# Patient Record
Sex: Female | Born: 1945
Health system: Southern US, Community
[De-identification: ages and names within clinical notes are randomized; demographics above are authoritative.]

## PROBLEM LIST (undated history)

## (undated) DIAGNOSIS — F419 Anxiety disorder, unspecified: Secondary | ICD-10-CM

## (undated) DIAGNOSIS — C9200 Acute myeloblastic leukemia, not having achieved remission: Secondary | ICD-10-CM

## (undated) DIAGNOSIS — K635 Polyp of colon: Secondary | ICD-10-CM

## (undated) DIAGNOSIS — K3533 Acute appendicitis with perforation and localized peritonitis, with abscess: Secondary | ICD-10-CM

## (undated) DIAGNOSIS — K5790 Diverticulosis of intestine, part unspecified, without perforation or abscess without bleeding: Secondary | ICD-10-CM

## (undated) DIAGNOSIS — Z952 Presence of prosthetic heart valve: Secondary | ICD-10-CM

## (undated) DIAGNOSIS — J40 Bronchitis, not specified as acute or chronic: Secondary | ICD-10-CM

## (undated) DIAGNOSIS — G4730 Sleep apnea, unspecified: Secondary | ICD-10-CM

## (undated) DIAGNOSIS — I4891 Unspecified atrial fibrillation: Secondary | ICD-10-CM

## (undated) DIAGNOSIS — E785 Hyperlipidemia, unspecified: Secondary | ICD-10-CM

## (undated) DIAGNOSIS — J189 Pneumonia, unspecified organism: Secondary | ICD-10-CM

## (undated) DIAGNOSIS — R001 Bradycardia, unspecified: Secondary | ICD-10-CM

## (undated) DIAGNOSIS — I4892 Unspecified atrial flutter: Secondary | ICD-10-CM

## (undated) DIAGNOSIS — I509 Heart failure, unspecified: Secondary | ICD-10-CM

## (undated) HISTORY — PX: TUBAL LIGATION: SHX77

## (undated) HISTORY — DX: Pneumonia, unspecified organism: J18.9

## (undated) HISTORY — DX: Diverticulosis of intestine, part unspecified, without perforation or abscess without bleeding: K57.90

## (undated) HISTORY — DX: Sleep apnea, unspecified: G47.30

## (undated) HISTORY — DX: Anxiety disorder, unspecified: F41.9

## (undated) HISTORY — DX: Polyp of colon: K63.5

## (undated) HISTORY — PX: OTHER SURGICAL HISTORY: SHX169

## (undated) HISTORY — DX: Hyperlipidemia, unspecified: E78.5

## (undated) HISTORY — DX: Bronchitis, not specified as acute or chronic: J40

## (undated) HISTORY — PX: PACEMAKER INSERTION: SHX728

## (undated) HISTORY — DX: Heart failure, unspecified: I50.9

## (undated) HISTORY — DX: Unspecified atrial fibrillation: I48.91

---

## 1984-09-16 HISTORY — PX: MITRAL VALVE REPLACEMENT: SHX147

## 1985-09-16 DIAGNOSIS — C9200 Acute myeloblastic leukemia, not having achieved remission: Secondary | ICD-10-CM

## 1985-09-16 HISTORY — DX: Acute myeloblastic leukemia, not having achieved remission: C92.00

## 2011-06-11 DIAGNOSIS — I4891 Unspecified atrial fibrillation: Secondary | ICD-10-CM | POA: Insufficient documentation

## 2011-06-11 DIAGNOSIS — Z952 Presence of prosthetic heart valve: Secondary | ICD-10-CM

## 2011-08-13 DIAGNOSIS — E785 Hyperlipidemia, unspecified: Secondary | ICD-10-CM | POA: Insufficient documentation

## 2011-08-14 DIAGNOSIS — F419 Anxiety disorder, unspecified: Secondary | ICD-10-CM | POA: Diagnosis present

## 2013-01-13 ENCOUNTER — Telehealth: Payer: Self-pay | Admitting: Family Medicine

## 2013-01-13 NOTE — Telephone Encounter (Signed)
appt made

## 2013-01-14 ENCOUNTER — Ambulatory Visit (INDEPENDENT_AMBULATORY_CARE_PROVIDER_SITE_OTHER): Payer: Medicare Other | Admitting: General Practice

## 2013-01-14 ENCOUNTER — Encounter: Payer: Self-pay | Admitting: General Practice

## 2013-01-14 VITALS — BP 126/78 | HR 74 | Temp 98.6°F | Ht 67.0 in | Wt 209.0 lb

## 2013-01-14 DIAGNOSIS — R350 Frequency of micturition: Secondary | ICD-10-CM

## 2013-01-14 DIAGNOSIS — N390 Urinary tract infection, site not specified: Secondary | ICD-10-CM

## 2013-01-14 LAB — POCT URINALYSIS DIPSTICK
Leukocytes, UA: NEGATIVE
Protein, UA: NEGATIVE
Spec Grav, UA: 1.02
Urobilinogen, UA: NEGATIVE
pH, UA: 5

## 2013-01-14 LAB — POCT UA - MICROSCOPIC ONLY
Casts, Ur, LPF, POC: NEGATIVE
Crystals, Ur, HPF, POC: NEGATIVE

## 2013-01-14 MED ORDER — CIPROFLOXACIN HCL 500 MG PO TABS: 500.0000 mg | ORAL_TABLET | Freq: Two times a day (BID) | ORAL | Status: DC

## 2013-01-14 NOTE — Progress Notes (Signed)
  Subjective:    Patient ID: Mercedes Thompson, female    DOB: September 19, 1945, 67 y.o.   MRN: [id number]  Urinary Tract Infection  This is a new problem. The current episode started in the past 7 days. The problem occurs intermittently. The problem has been gradually worsening. The quality of the pain is described as aching. There has been no fever. She is sexually active. There is no history of pyelonephritis. Associated symptoms include urgency. Pertinent negatives include no chills or flank pain. She has tried nothing for the symptoms. Her past medical history is significant for recurrent UTIs.  Reports also feeling weak and this occurs sometimes when heart irregular.     Review of Systems  Constitutional: Negative for fever and chills.  Respiratory: Negative for chest tightness and shortness of breath.   Cardiovascular: Negative for chest pain.  Gastrointestinal: Negative for abdominal pain and abdominal distention.  Genitourinary: Positive for urgency. Negative for flank pain and difficulty urinating.  Musculoskeletal: Negative for back pain.  Skin: Negative.   Neurological: Negative for dizziness and headaches.  Psychiatric/Behavioral: Negative.        Objective:   Physical Exam  Constitutional: She is oriented to person, place, and time. She appears well-developed and well-nourished.  Cardiovascular: Normal rate and normal heart sounds.   No murmur heard. Irregular heart rhythm  Pulmonary/Chest: Effort normal and breath sounds normal. No respiratory distress. She exhibits no tenderness.  Neurological: She is alert and oriented to person, place, and time.  Skin: Skin is warm and dry.  Psychiatric: She has a normal mood and affect.   Results for orders placed in visit on 01/14/13  POCT UA - MICROSCOPIC ONLY      Result Value Range   WBC, Ur, HPF, POC 1-2     RBC, urine, microscopic 2-10     Bacteria, U Microscopic few     Mucus, UA mod     Epithelial cells, urine per micros few      Crystals, Ur, HPF, POC neg     Casts, Ur, LPF, POC neg     Yeast, UA neg    POCT URINALYSIS DIPSTICK      Result Value Range   Color, UA yellow     Clarity, UA clear     Glucose, UA neg     Bilirubin, UA neg     Ketones, UA neg     Spec Grav, UA 1.020     Blood, UA mod     pH, UA 5.0     Protein, UA neg     Urobilinogen, UA negative     Nitrite, UA neg     Leukocytes, UA Negative           Assessment & Plan:  Take medications as prescribed and complete course even if feeling better Increase fluid intake Discussed prevention of UTI and proper perineal care Patient verbalized understanding Raymon Mutton, FNP-C

## 2013-01-14 NOTE — Patient Instructions (Addendum)
Urinary Tract Infection Urinary tract infections (UTIs) can develop anywhere along your urinary tract. Your urinary tract is your body's drainage system for removing wastes and extra water. Your urinary tract includes two kidneys, two ureters, a bladder, and a urethra. Your kidneys are a pair of bean-shaped organs. Each kidney is about the size of your fist. They are located below your ribs, one on each side of your spine. CAUSES Infections are caused by microbes, which are microscopic organisms, including fungi, viruses, and bacteria. These organisms are so small that they can only be seen through a microscope. Bacteria are the microbes that most commonly cause UTIs. SYMPTOMS  Symptoms of UTIs may vary by age and gender of the patient and by the location of the infection. Symptoms in young women typically include a frequent and intense urge to urinate and a painful, burning feeling in the bladder or urethra during urination. Older women and men are more likely to be tired, shaky, and weak and have muscle aches and abdominal pain. A fever may mean the infection is in your kidneys. Other symptoms of a kidney infection include pain in your back or sides below the ribs, nausea, and vomiting. DIAGNOSIS To diagnose a UTI, your caregiver will ask you about your symptoms. Your caregiver also will ask to provide a urine sample. The urine sample will be tested for bacteria and white blood cells. White blood cells are made by your body to help fight infection. TREATMENT  Typically, UTIs can be treated with medication. Because most UTIs are caused by a bacterial infection, they usually can be treated with the use of antibiotics. The choice of antibiotic and length of treatment depend on your symptoms and the type of bacteria causing your infection. HOME CARE INSTRUCTIONS  If you were prescribed antibiotics, take them exactly as your caregiver instructs you. Finish the medication even if you feel better after you  have only taken some of the medication.  Drink enough water and fluids to keep your urine clear or pale yellow.  Avoid caffeine, tea, and carbonated beverages. They tend to irritate your bladder.  Empty your bladder often. Avoid holding urine for long periods of time.  Empty your bladder before and after sexual intercourse.  After a bowel movement, women should cleanse from front to back. Use each tissue only once. SEEK MEDICAL CARE IF:   You have back pain.  You develop a fever.  Your symptoms do not begin to resolve within 3 days. SEEK IMMEDIATE MEDICAL CARE IF:   You have severe back pain or lower abdominal pain.  You develop chills.  You have nausea or vomiting.  You have continued burning or discomfort with urination. MAKE SURE YOU:   Understand these instructions.  Will watch your condition.  Will get help right away if you are not doing well or get worse. Document Released: 06/12/2005 Document Revised: 03/03/2012 Document Reviewed: 10/11/2011 Exit[employer] Patient Information 2013 [employer].  

## 2013-03-25 DIAGNOSIS — N393 Stress incontinence (female) (male): Secondary | ICD-10-CM | POA: Insufficient documentation

## 2013-09-16 DIAGNOSIS — R001 Bradycardia, unspecified: Secondary | ICD-10-CM

## 2013-09-16 HISTORY — DX: Bradycardia, unspecified: R00.1

## 2014-02-25 ENCOUNTER — Ambulatory Visit (HOSPITAL_BASED_OUTPATIENT_CLINIC_OR_DEPARTMENT_OTHER): Payer: Medicare Other | Attending: Family Medicine

## 2014-02-25 VITALS — Ht 67.0 in | Wt 224.0 lb

## 2014-02-25 DIAGNOSIS — R0609 Other forms of dyspnea: Secondary | ICD-10-CM | POA: Insufficient documentation

## 2014-02-25 DIAGNOSIS — G4761 Periodic limb movement disorder: Secondary | ICD-10-CM | POA: Diagnosis not present

## 2014-02-25 DIAGNOSIS — G4733 Obstructive sleep apnea (adult) (pediatric): Secondary | ICD-10-CM | POA: Diagnosis not present

## 2014-02-25 DIAGNOSIS — R0989 Other specified symptoms and signs involving the circulatory and respiratory systems: Secondary | ICD-10-CM | POA: Insufficient documentation

## 2014-02-25 DIAGNOSIS — G4730 Sleep apnea, unspecified: Secondary | ICD-10-CM | POA: Diagnosis present

## 2014-02-25 DIAGNOSIS — G4700 Insomnia, unspecified: Secondary | ICD-10-CM | POA: Diagnosis present

## 2014-02-25 DIAGNOSIS — I4949 Other premature depolarization: Secondary | ICD-10-CM | POA: Diagnosis not present

## 2014-02-27 DIAGNOSIS — G4730 Sleep apnea, unspecified: Secondary | ICD-10-CM

## 2014-02-27 DIAGNOSIS — G4700 Insomnia, unspecified: Secondary | ICD-10-CM

## 2014-02-27 NOTE — Sleep Study (Signed)
   NAME: Mercedes Thompson DATE OF BIRTH:  1946-01-23 MEDICAL RECORD NUMBER [id number]  LOCATION: [facility]  PHYSICIAN: Tyara Dassow D  DATE OF STUDY: 02/25/2014  SLEEP STUDY TYPE: Nocturnal Polysomnogram               REFERRING PHYSICIAN: Jonathon Bellows, MD  INDICATION FOR STUDY: Insomnia with sleep apnea  EPWORTH SLEEPINESS SCORE:   12/24  HEIGHT: 5\' 7"  (170.2 cm)  WEIGHT: 101.606 kg (224 lb)    Body mass index is 35.08 kg/(m^2).  NECK SIZE: 15 in.  MEDICATIONS: Charted for review  SLEEP ARCHITECTURE: Total sleep time 213.5 minutes with sleep efficiency 50.3%. Stage I was 5.2%, stage II 75.9%, stage III absent, REM 19% of total sleep time. Sleep latency 41 minutes, REM latency 112.5 minutes, awake after sleep onset 168 minutes, arousal index 5.3, bedtime medication: Lorazepam. Significant difficulty maintaining sleep throughout the study.  RESPIRATORY DATA: Apnea hypopneas index (AHI) 69.1 per hour. 246 total events scored including 135 obstructive apneas, 1 central apnea, 7 mixed apneas, 103 hypopneas. Events were not positional. REM AHI 57.8 per hour. She did not have enough sleep to meet protocol requirements for split protocol CPAP titration on this study night.  OXYGEN DATA: Loud snoring with oxygen desaturation to a nadir of 82% and mean oxygen saturation through the study of 91.5% on room air.  CARDIAC DATA: Sinus rhythm with PVCs  MOVEMENT/PARASOMNIA: 33 limb jerks were counted of which 2 were associated with arousals or awakenings for a periodic limb movement with arousal index of 0.6 per hour.  IMPRESSION/ RECOMMENDATION:   1) Significant difficulty maintaining sleep throughout the study despite lorazepam at bedtime. 2) Severe obstructive sleep apnea/hypopneas syndrome, AHI 69.1 per hour with non-positional events. REM AHI 57.8 per hour. Loud snoring with oxygen desaturation to a nadir of 82% and a mean oxygen saturation through the study of 91.5% on  room air. 3) Scores in this range are usually addressed first with CPAP. Referral sleep medicine consultation is available if needed. This patient can return for a dedicated CPAP titration study if appropriate.   Signed Baird Lyons M.D. Deneise Lever Diplomate, American Board of Sleep Medicine  ELECTRONICALLY SIGNED ON:  02/27/2014, 9:47 AM [place] PH: [phone]   FX: [phone] [place]

## 2014-03-23 ENCOUNTER — Ambulatory Visit (HOSPITAL_BASED_OUTPATIENT_CLINIC_OR_DEPARTMENT_OTHER): Payer: Medicare Other | Attending: Family Medicine | Admitting: Radiology

## 2014-03-23 VITALS — Ht 67.0 in | Wt 224.0 lb

## 2014-03-23 DIAGNOSIS — G4710 Hypersomnia, unspecified: Secondary | ICD-10-CM | POA: Insufficient documentation

## 2014-03-23 DIAGNOSIS — G4730 Sleep apnea, unspecified: Secondary | ICD-10-CM | POA: Diagnosis not present

## 2014-03-26 DIAGNOSIS — G4710 Hypersomnia, unspecified: Secondary | ICD-10-CM

## 2014-03-26 DIAGNOSIS — G4730 Sleep apnea, unspecified: Secondary | ICD-10-CM

## 2014-03-26 NOTE — Sleep Study (Signed)
   NAME: Mercedes Thompson DATE OF BIRTH:  1946/05/12 MEDICAL RECORD NUMBER [id number]  LOCATION: [facility]  PHYSICIAN: YOUNG,CLINTON D  DATE OF STUDY: 03/23/2014  SLEEP STUDY TYPE: Nocturnal Polysomnogram               REFERRING PHYSICIAN: Jonathon Bellows, MD  INDICATION FOR STUDY: Hypersomnia with sleep apnea-CPAP titration  EPWORTH SLEEPINESS SCORE:   12/24 HEIGHT: 5\' 7"  (170.2 cm)  WEIGHT: 224 lb (101.606 kg)    Body mass index is 35.08 kg/(m^2).  NECK SIZE: 14 in.  MEDICATIONS: Charted for review  SLEEP ARCHITECTURE: Total sleep time 326 minutes with sleep efficiency 71.4%. Stage I was 6%, stage II 70.1%, stage III 4.3%, REM 19.6% of total sleep time. Sleep latency 41.5 minutes, REM latency 123 minutes, awake after sleep onset 21.7 minutes, arousal index 36.3, bedtime medication: Lorazepam  RESPIRATORY DATA: CPAP titration protocol. CPAP was titrated to 12 CWP with residual AHI 66.7 per hour reflecting development of central apneas. Masks were changed several times to control leaks and maintain comfort. She was changed to bilevel for higher pressures and to address central apneas with final inspiratory pressure 17 and expiratory pressure 13. This is still associated with a few central apneas. A CPAP pressure of 12 gave adequate control of obstructive events while lying on her side. This would probably be the best pressure for initial home trial.   CARDIAC DATA: Sinus rhythm with occasional PVC  MOVEMENT/PARASOMNIA: Periodic limb movement with 166 total limb jerks counted of which 41 were associated with arousal or awakening for periodic limb movement with arousal index of 7.5 per hour. Bathroom x1  IMPRESSION/ RECOMMENDATION:   1) Difficult CPAP titration. Several mask changes were required before settling on a medium ResMed Quattro fullface mask with heated humidifier. Titration was tried to a maximum CPAP pressure of 16 and several bilevel pressures.      2) Best  comfort and control seem associated with a CPAP of 12. Central apneas were beginning to develop at that pressure level but these do not necessarily require intervention.  3) Baseline polysomnogram on 02/25/2014 record AHI 69.1 per hour with body weight 224 pounds. This relatively high baseline score and difficulty achieving CPAP control suggest the possibility of soft tissue upper airway obstruction. Consider ENT evaluation if clinically appropriate.  Signed Baird Lyons M.D. Deneise Lever Diplomate, American Board of Sleep Medicine  ELECTRONICALLY SIGNED ON:  03/26/2014, 12:45 PM [place] PH: [phone]   FX: [phone] [place]

## 2014-03-29 ENCOUNTER — Ambulatory Visit (HOSPITAL_BASED_OUTPATIENT_CLINIC_OR_DEPARTMENT_OTHER): Payer: Medicare Other

## 2014-04-11 ENCOUNTER — Ambulatory Visit: Payer: Medicare Other | Admitting: Podiatry

## 2014-06-27 ENCOUNTER — Ambulatory Visit: Payer: Medicare Other | Admitting: Podiatry

## 2014-09-16 DIAGNOSIS — I4892 Unspecified atrial flutter: Secondary | ICD-10-CM

## 2014-09-16 HISTORY — DX: Unspecified atrial flutter: I48.92

## 2014-09-20 DIAGNOSIS — Z881 Allergy status to other antibiotic agents status: Secondary | ICD-10-CM | POA: Diagnosis not present

## 2014-09-20 DIAGNOSIS — I484 Atypical atrial flutter: Secondary | ICD-10-CM | POA: Diagnosis not present

## 2014-09-20 DIAGNOSIS — Z954 Presence of other heart-valve replacement: Secondary | ICD-10-CM | POA: Diagnosis not present

## 2014-09-20 DIAGNOSIS — Z888 Allergy status to other drugs, medicaments and biological substances status: Secondary | ICD-10-CM | POA: Diagnosis not present

## 2014-09-20 DIAGNOSIS — Z882 Allergy status to sulfonamides status: Secondary | ICD-10-CM | POA: Diagnosis not present

## 2014-09-27 DIAGNOSIS — S92251D Displaced fracture of navicular [scaphoid] of right foot, subsequent encounter for fracture with routine healing: Secondary | ICD-10-CM | POA: Diagnosis not present

## 2014-10-04 DIAGNOSIS — Z45018 Encounter for adjustment and management of other part of cardiac pacemaker: Secondary | ICD-10-CM | POA: Diagnosis not present

## 2014-10-04 DIAGNOSIS — I499 Cardiac arrhythmia, unspecified: Secondary | ICD-10-CM | POA: Diagnosis not present

## 2014-10-17 DIAGNOSIS — Z7901 Long term (current) use of anticoagulants: Secondary | ICD-10-CM | POA: Diagnosis not present

## 2014-10-19 DIAGNOSIS — Z45018 Encounter for adjustment and management of other part of cardiac pacemaker: Secondary | ICD-10-CM | POA: Diagnosis not present

## 2014-10-19 DIAGNOSIS — Z950 Presence of cardiac pacemaker: Secondary | ICD-10-CM | POA: Diagnosis not present

## 2014-10-19 DIAGNOSIS — Z952 Presence of prosthetic heart valve: Secondary | ICD-10-CM | POA: Diagnosis not present

## 2014-10-19 DIAGNOSIS — Z7901 Long term (current) use of anticoagulants: Secondary | ICD-10-CM | POA: Diagnosis not present

## 2014-10-19 DIAGNOSIS — I499 Cardiac arrhythmia, unspecified: Secondary | ICD-10-CM | POA: Diagnosis not present

## 2014-10-19 DIAGNOSIS — I4891 Unspecified atrial fibrillation: Secondary | ICD-10-CM | POA: Diagnosis not present

## 2014-10-19 DIAGNOSIS — Z79899 Other long term (current) drug therapy: Secondary | ICD-10-CM | POA: Diagnosis not present

## 2014-10-25 DIAGNOSIS — Z7901 Long term (current) use of anticoagulants: Secondary | ICD-10-CM | POA: Diagnosis not present

## 2014-11-02 DIAGNOSIS — Z7901 Long term (current) use of anticoagulants: Secondary | ICD-10-CM | POA: Diagnosis not present

## 2014-11-15 DIAGNOSIS — Z7901 Long term (current) use of anticoagulants: Secondary | ICD-10-CM | POA: Diagnosis not present

## 2014-11-29 DIAGNOSIS — Z01419 Encounter for gynecological examination (general) (routine) without abnormal findings: Secondary | ICD-10-CM | POA: Diagnosis not present

## 2014-12-16 DIAGNOSIS — Z7901 Long term (current) use of anticoagulants: Secondary | ICD-10-CM | POA: Diagnosis not present

## 2014-12-19 DIAGNOSIS — Z13820 Encounter for screening for osteoporosis: Secondary | ICD-10-CM | POA: Diagnosis not present

## 2014-12-19 DIAGNOSIS — Z780 Asymptomatic menopausal state: Secondary | ICD-10-CM | POA: Diagnosis not present

## 2015-01-16 DIAGNOSIS — Z7901 Long term (current) use of anticoagulants: Secondary | ICD-10-CM | POA: Diagnosis not present

## 2015-01-17 DIAGNOSIS — Z950 Presence of cardiac pacemaker: Secondary | ICD-10-CM | POA: Diagnosis not present

## 2015-02-08 DIAGNOSIS — I484 Atypical atrial flutter: Secondary | ICD-10-CM | POA: Diagnosis not present

## 2015-02-08 DIAGNOSIS — Z7901 Long term (current) use of anticoagulants: Secondary | ICD-10-CM | POA: Diagnosis not present

## 2015-02-08 DIAGNOSIS — Z79899 Other long term (current) drug therapy: Secondary | ICD-10-CM | POA: Diagnosis not present

## 2015-02-08 DIAGNOSIS — Z952 Presence of prosthetic heart valve: Secondary | ICD-10-CM | POA: Diagnosis not present

## 2015-02-08 DIAGNOSIS — I499 Cardiac arrhythmia, unspecified: Secondary | ICD-10-CM | POA: Diagnosis not present

## 2015-02-08 DIAGNOSIS — I481 Persistent atrial fibrillation: Secondary | ICD-10-CM | POA: Diagnosis not present

## 2015-02-14 DIAGNOSIS — Z7901 Long term (current) use of anticoagulants: Secondary | ICD-10-CM | POA: Diagnosis not present

## 2015-03-06 DIAGNOSIS — I340 Nonrheumatic mitral (valve) insufficiency: Secondary | ICD-10-CM | POA: Diagnosis not present

## 2015-03-06 DIAGNOSIS — Z952 Presence of prosthetic heart valve: Secondary | ICD-10-CM | POA: Diagnosis not present

## 2015-03-06 DIAGNOSIS — I484 Atypical atrial flutter: Secondary | ICD-10-CM | POA: Diagnosis not present

## 2015-03-06 DIAGNOSIS — Z856 Personal history of leukemia: Secondary | ICD-10-CM | POA: Diagnosis not present

## 2015-03-07 DIAGNOSIS — I4581 Long QT syndrome: Secondary | ICD-10-CM | POA: Diagnosis not present

## 2015-03-07 DIAGNOSIS — Z5181 Encounter for therapeutic drug level monitoring: Secondary | ICD-10-CM | POA: Insufficient documentation

## 2015-03-07 DIAGNOSIS — Z79899 Other long term (current) drug therapy: Secondary | ICD-10-CM | POA: Diagnosis not present

## 2015-03-07 DIAGNOSIS — Z7951 Long term (current) use of inhaled steroids: Secondary | ICD-10-CM | POA: Diagnosis not present

## 2015-03-07 DIAGNOSIS — Z952 Presence of prosthetic heart valve: Secondary | ICD-10-CM | POA: Diagnosis not present

## 2015-03-07 DIAGNOSIS — Z950 Presence of cardiac pacemaker: Secondary | ICD-10-CM | POA: Diagnosis not present

## 2015-03-07 DIAGNOSIS — Z856 Personal history of leukemia: Secondary | ICD-10-CM | POA: Diagnosis not present

## 2015-03-07 DIAGNOSIS — R9431 Abnormal electrocardiogram [ECG] [EKG]: Secondary | ICD-10-CM | POA: Diagnosis not present

## 2015-03-07 DIAGNOSIS — I4892 Unspecified atrial flutter: Secondary | ICD-10-CM | POA: Diagnosis not present

## 2015-03-07 DIAGNOSIS — I4891 Unspecified atrial fibrillation: Secondary | ICD-10-CM | POA: Diagnosis not present

## 2015-03-07 DIAGNOSIS — Z881 Allergy status to other antibiotic agents status: Secondary | ICD-10-CM | POA: Diagnosis not present

## 2015-03-07 DIAGNOSIS — I481 Persistent atrial fibrillation: Secondary | ICD-10-CM | POA: Diagnosis not present

## 2015-03-07 DIAGNOSIS — Z888 Allergy status to other drugs, medicaments and biological substances status: Secondary | ICD-10-CM | POA: Diagnosis not present

## 2015-03-07 DIAGNOSIS — Z7901 Long term (current) use of anticoagulants: Secondary | ICD-10-CM | POA: Diagnosis not present

## 2015-03-17 DIAGNOSIS — K3533 Acute appendicitis with perforation and localized peritonitis, with abscess: Secondary | ICD-10-CM

## 2015-03-17 HISTORY — DX: Acute appendicitis with perforation, localized peritonitis, and gangrene, with abscess: K35.33

## 2015-03-22 DIAGNOSIS — Z7901 Long term (current) use of anticoagulants: Secondary | ICD-10-CM

## 2015-03-22 DIAGNOSIS — R399 Unspecified symptoms and signs involving the genitourinary system: Secondary | ICD-10-CM | POA: Diagnosis not present

## 2015-03-22 DIAGNOSIS — K3580 Unspecified acute appendicitis: Secondary | ICD-10-CM | POA: Diagnosis not present

## 2015-03-22 DIAGNOSIS — R1011 Right upper quadrant pain: Secondary | ICD-10-CM | POA: Diagnosis not present

## 2015-03-22 DIAGNOSIS — I459 Conduction disorder, unspecified: Secondary | ICD-10-CM | POA: Diagnosis not present

## 2015-03-22 DIAGNOSIS — Z87891 Personal history of nicotine dependence: Secondary | ICD-10-CM | POA: Diagnosis not present

## 2015-03-22 DIAGNOSIS — R1031 Right lower quadrant pain: Secondary | ICD-10-CM | POA: Diagnosis not present

## 2015-03-22 DIAGNOSIS — K352 Acute appendicitis with generalized peritonitis: Secondary | ICD-10-CM | POA: Diagnosis not present

## 2015-03-22 DIAGNOSIS — K353 Acute appendicitis with localized peritonitis: Secondary | ICD-10-CM | POA: Diagnosis not present

## 2015-03-22 DIAGNOSIS — I484 Atypical atrial flutter: Secondary | ICD-10-CM | POA: Diagnosis not present

## 2015-03-22 DIAGNOSIS — I4891 Unspecified atrial fibrillation: Secondary | ICD-10-CM | POA: Diagnosis not present

## 2015-03-22 DIAGNOSIS — R109 Unspecified abdominal pain: Secondary | ICD-10-CM | POA: Diagnosis not present

## 2015-03-22 DIAGNOSIS — Z5181 Encounter for therapeutic drug level monitoring: Secondary | ICD-10-CM | POA: Diagnosis not present

## 2015-03-23 DIAGNOSIS — Z5181 Encounter for therapeutic drug level monitoring: Secondary | ICD-10-CM | POA: Diagnosis not present

## 2015-03-23 DIAGNOSIS — Z950 Presence of cardiac pacemaker: Secondary | ICD-10-CM | POA: Diagnosis not present

## 2015-03-23 DIAGNOSIS — I459 Conduction disorder, unspecified: Secondary | ICD-10-CM | POA: Diagnosis present

## 2015-03-23 DIAGNOSIS — Z79899 Other long term (current) drug therapy: Secondary | ICD-10-CM | POA: Diagnosis not present

## 2015-03-23 DIAGNOSIS — R109 Unspecified abdominal pain: Secondary | ICD-10-CM | POA: Diagnosis not present

## 2015-03-23 DIAGNOSIS — Z01810 Encounter for preprocedural cardiovascular examination: Secondary | ICD-10-CM | POA: Diagnosis not present

## 2015-03-23 DIAGNOSIS — Z952 Presence of prosthetic heart valve: Secondary | ICD-10-CM | POA: Diagnosis not present

## 2015-03-23 DIAGNOSIS — Z856 Personal history of leukemia: Secondary | ICD-10-CM | POA: Diagnosis not present

## 2015-03-23 DIAGNOSIS — Z7901 Long term (current) use of anticoagulants: Secondary | ICD-10-CM | POA: Diagnosis not present

## 2015-03-23 DIAGNOSIS — I484 Atypical atrial flutter: Secondary | ICD-10-CM | POA: Diagnosis not present

## 2015-03-23 DIAGNOSIS — I499 Cardiac arrhythmia, unspecified: Secondary | ICD-10-CM | POA: Diagnosis not present

## 2015-03-23 DIAGNOSIS — Z87891 Personal history of nicotine dependence: Secondary | ICD-10-CM | POA: Diagnosis not present

## 2015-03-23 DIAGNOSIS — I4891 Unspecified atrial fibrillation: Secondary | ICD-10-CM | POA: Diagnosis not present

## 2015-03-23 DIAGNOSIS — K352 Acute appendicitis with generalized peritonitis: Secondary | ICD-10-CM | POA: Diagnosis present

## 2015-03-24 HISTORY — PX: APPENDECTOMY: SHX54

## 2015-03-29 DIAGNOSIS — Z7901 Long term (current) use of anticoagulants: Secondary | ICD-10-CM | POA: Diagnosis not present

## 2015-04-04 DIAGNOSIS — Z7901 Long term (current) use of anticoagulants: Secondary | ICD-10-CM | POA: Diagnosis not present

## 2015-04-13 DIAGNOSIS — Z7901 Long term (current) use of anticoagulants: Secondary | ICD-10-CM | POA: Diagnosis not present

## 2015-04-16 DIAGNOSIS — Z7901 Long term (current) use of anticoagulants: Secondary | ICD-10-CM | POA: Diagnosis not present

## 2015-04-18 DIAGNOSIS — Z48815 Encounter for surgical aftercare following surgery on the digestive system: Secondary | ICD-10-CM | POA: Diagnosis not present

## 2015-04-18 DIAGNOSIS — K3580 Unspecified acute appendicitis: Secondary | ICD-10-CM | POA: Diagnosis not present

## 2015-04-19 DIAGNOSIS — Z0000 Encounter for general adult medical examination without abnormal findings: Secondary | ICD-10-CM | POA: Diagnosis not present

## 2015-04-19 DIAGNOSIS — G4733 Obstructive sleep apnea (adult) (pediatric): Secondary | ICD-10-CM | POA: Diagnosis not present

## 2015-04-19 DIAGNOSIS — I4891 Unspecified atrial fibrillation: Secondary | ICD-10-CM | POA: Diagnosis not present

## 2015-04-19 DIAGNOSIS — E785 Hyperlipidemia, unspecified: Secondary | ICD-10-CM | POA: Diagnosis not present

## 2015-04-19 DIAGNOSIS — Z952 Presence of prosthetic heart valve: Secondary | ICD-10-CM | POA: Diagnosis not present

## 2015-04-19 DIAGNOSIS — C9590 Leukemia, unspecified not having achieved remission: Secondary | ICD-10-CM | POA: Diagnosis not present

## 2015-04-19 DIAGNOSIS — E559 Vitamin D deficiency, unspecified: Secondary | ICD-10-CM | POA: Diagnosis not present

## 2015-04-19 DIAGNOSIS — Z7901 Long term (current) use of anticoagulants: Secondary | ICD-10-CM | POA: Diagnosis not present

## 2015-04-21 DIAGNOSIS — Z7901 Long term (current) use of anticoagulants: Secondary | ICD-10-CM | POA: Diagnosis not present

## 2015-04-24 DIAGNOSIS — Z7901 Long term (current) use of anticoagulants: Secondary | ICD-10-CM | POA: Diagnosis not present

## 2015-04-28 DIAGNOSIS — Z7901 Long term (current) use of anticoagulants: Secondary | ICD-10-CM | POA: Diagnosis not present

## 2015-05-02 DIAGNOSIS — Z7901 Long term (current) use of anticoagulants: Secondary | ICD-10-CM | POA: Diagnosis not present

## 2015-05-07 DIAGNOSIS — R350 Frequency of micturition: Secondary | ICD-10-CM | POA: Diagnosis not present

## 2015-05-09 DIAGNOSIS — Z7901 Long term (current) use of anticoagulants: Secondary | ICD-10-CM | POA: Diagnosis not present

## 2015-05-10 DIAGNOSIS — R000 Tachycardia, unspecified: Secondary | ICD-10-CM | POA: Diagnosis not present

## 2015-05-10 DIAGNOSIS — I480 Paroxysmal atrial fibrillation: Secondary | ICD-10-CM | POA: Diagnosis not present

## 2015-05-10 DIAGNOSIS — I495 Sick sinus syndrome: Secondary | ICD-10-CM | POA: Diagnosis not present

## 2015-05-10 DIAGNOSIS — Z79899 Other long term (current) drug therapy: Secondary | ICD-10-CM | POA: Diagnosis not present

## 2015-05-10 DIAGNOSIS — Z950 Presence of cardiac pacemaker: Secondary | ICD-10-CM | POA: Diagnosis not present

## 2015-05-10 DIAGNOSIS — R5383 Other fatigue: Secondary | ICD-10-CM | POA: Diagnosis not present

## 2015-05-10 DIAGNOSIS — Z7901 Long term (current) use of anticoagulants: Secondary | ICD-10-CM | POA: Diagnosis not present

## 2015-05-10 DIAGNOSIS — I4891 Unspecified atrial fibrillation: Secondary | ICD-10-CM | POA: Diagnosis not present

## 2015-05-10 DIAGNOSIS — Z952 Presence of prosthetic heart valve: Secondary | ICD-10-CM | POA: Diagnosis not present

## 2015-05-10 DIAGNOSIS — I4892 Unspecified atrial flutter: Secondary | ICD-10-CM | POA: Diagnosis not present

## 2015-05-12 DIAGNOSIS — Z7901 Long term (current) use of anticoagulants: Secondary | ICD-10-CM | POA: Diagnosis not present

## 2015-05-15 DIAGNOSIS — Z23 Encounter for immunization: Secondary | ICD-10-CM | POA: Diagnosis not present

## 2015-05-15 DIAGNOSIS — N390 Urinary tract infection, site not specified: Secondary | ICD-10-CM | POA: Diagnosis not present

## 2015-05-15 DIAGNOSIS — Z7901 Long term (current) use of anticoagulants: Secondary | ICD-10-CM | POA: Diagnosis not present

## 2015-05-15 DIAGNOSIS — L298 Other pruritus: Secondary | ICD-10-CM | POA: Diagnosis not present

## 2015-05-17 DIAGNOSIS — Z950 Presence of cardiac pacemaker: Secondary | ICD-10-CM | POA: Diagnosis present

## 2015-05-17 DIAGNOSIS — I481 Persistent atrial fibrillation: Secondary | ICD-10-CM | POA: Diagnosis not present

## 2015-05-17 DIAGNOSIS — I495 Sick sinus syndrome: Secondary | ICD-10-CM | POA: Insufficient documentation

## 2015-05-17 DIAGNOSIS — Z4502 Encounter for adjustment and management of automatic implantable cardiac defibrillator: Secondary | ICD-10-CM | POA: Diagnosis not present

## 2015-05-17 DIAGNOSIS — I4892 Unspecified atrial flutter: Secondary | ICD-10-CM | POA: Diagnosis not present

## 2015-05-23 DIAGNOSIS — Z7901 Long term (current) use of anticoagulants: Secondary | ICD-10-CM | POA: Diagnosis not present

## 2015-06-13 DIAGNOSIS — Z7901 Long term (current) use of anticoagulants: Secondary | ICD-10-CM | POA: Diagnosis not present

## 2015-06-14 DIAGNOSIS — J329 Chronic sinusitis, unspecified: Secondary | ICD-10-CM | POA: Diagnosis not present

## 2015-06-20 ENCOUNTER — Ambulatory Visit
Admission: RE | Admit: 2015-06-20 | Discharge: 2015-06-20 | Disposition: A | Discharge status: Home / Self Care | Payer: Medicare Other | Source: Ambulatory Visit | Attending: Family Medicine | Admitting: Family Medicine

## 2015-06-20 ENCOUNTER — Other Ambulatory Visit: Payer: Self-pay | Admitting: Family Medicine

## 2015-06-20 DIAGNOSIS — R05 Cough: Secondary | ICD-10-CM | POA: Diagnosis not present

## 2015-06-20 DIAGNOSIS — R059 Cough, unspecified: Secondary | ICD-10-CM

## 2015-06-20 DIAGNOSIS — Z7901 Long term (current) use of anticoagulants: Secondary | ICD-10-CM | POA: Diagnosis not present

## 2015-06-27 DIAGNOSIS — Z7901 Long term (current) use of anticoagulants: Secondary | ICD-10-CM | POA: Diagnosis not present

## 2015-07-03 DIAGNOSIS — Z7901 Long term (current) use of anticoagulants: Secondary | ICD-10-CM | POA: Diagnosis not present

## 2015-07-07 DIAGNOSIS — Z7901 Long term (current) use of anticoagulants: Secondary | ICD-10-CM | POA: Diagnosis not present

## 2015-07-10 DIAGNOSIS — Z950 Presence of cardiac pacemaker: Secondary | ICD-10-CM | POA: Diagnosis not present

## 2015-07-10 DIAGNOSIS — I481 Persistent atrial fibrillation: Secondary | ICD-10-CM | POA: Diagnosis not present

## 2015-07-10 DIAGNOSIS — I4892 Unspecified atrial flutter: Secondary | ICD-10-CM | POA: Diagnosis not present

## 2015-07-10 DIAGNOSIS — Z8679 Personal history of other diseases of the circulatory system: Secondary | ICD-10-CM | POA: Diagnosis not present

## 2015-07-10 DIAGNOSIS — I517 Cardiomegaly: Secondary | ICD-10-CM | POA: Diagnosis not present

## 2015-07-10 DIAGNOSIS — Z7901 Long term (current) use of anticoagulants: Secondary | ICD-10-CM | POA: Diagnosis not present

## 2015-07-11 DIAGNOSIS — Z950 Presence of cardiac pacemaker: Secondary | ICD-10-CM | POA: Diagnosis not present

## 2015-07-11 DIAGNOSIS — G4730 Sleep apnea, unspecified: Secondary | ICD-10-CM | POA: Diagnosis not present

## 2015-07-11 DIAGNOSIS — I481 Persistent atrial fibrillation: Secondary | ICD-10-CM | POA: Diagnosis not present

## 2015-07-11 DIAGNOSIS — Z954 Presence of other heart-valve replacement: Secondary | ICD-10-CM | POA: Diagnosis not present

## 2015-07-11 DIAGNOSIS — I484 Atypical atrial flutter: Secondary | ICD-10-CM | POA: Diagnosis not present

## 2015-07-11 DIAGNOSIS — I4891 Unspecified atrial fibrillation: Secondary | ICD-10-CM | POA: Diagnosis not present

## 2015-07-11 DIAGNOSIS — I4892 Unspecified atrial flutter: Secondary | ICD-10-CM | POA: Diagnosis not present

## 2015-07-12 DIAGNOSIS — I481 Persistent atrial fibrillation: Secondary | ICD-10-CM | POA: Diagnosis not present

## 2015-07-12 DIAGNOSIS — G4730 Sleep apnea, unspecified: Secondary | ICD-10-CM | POA: Diagnosis not present

## 2015-07-12 DIAGNOSIS — Z954 Presence of other heart-valve replacement: Secondary | ICD-10-CM | POA: Diagnosis not present

## 2015-07-12 DIAGNOSIS — I4892 Unspecified atrial flutter: Secondary | ICD-10-CM | POA: Diagnosis not present

## 2015-07-12 DIAGNOSIS — I4891 Unspecified atrial fibrillation: Secondary | ICD-10-CM | POA: Diagnosis not present

## 2015-07-12 DIAGNOSIS — Z950 Presence of cardiac pacemaker: Secondary | ICD-10-CM | POA: Diagnosis not present

## 2015-07-14 DIAGNOSIS — Z7901 Long term (current) use of anticoagulants: Secondary | ICD-10-CM | POA: Diagnosis not present

## 2015-07-14 DIAGNOSIS — I471 Supraventricular tachycardia: Secondary | ICD-10-CM | POA: Diagnosis not present

## 2015-07-14 DIAGNOSIS — Z950 Presence of cardiac pacemaker: Secondary | ICD-10-CM | POA: Diagnosis not present

## 2015-07-14 DIAGNOSIS — F419 Anxiety disorder, unspecified: Secondary | ICD-10-CM | POA: Diagnosis not present

## 2015-07-17 DIAGNOSIS — Z7901 Long term (current) use of anticoagulants: Secondary | ICD-10-CM | POA: Diagnosis not present

## 2015-07-20 DIAGNOSIS — Z7901 Long term (current) use of anticoagulants: Secondary | ICD-10-CM | POA: Diagnosis not present

## 2015-07-24 DIAGNOSIS — Z7901 Long term (current) use of anticoagulants: Secondary | ICD-10-CM | POA: Diagnosis not present

## 2015-07-27 DIAGNOSIS — Z7901 Long term (current) use of anticoagulants: Secondary | ICD-10-CM | POA: Diagnosis not present

## 2015-07-31 DIAGNOSIS — Z7901 Long term (current) use of anticoagulants: Secondary | ICD-10-CM | POA: Diagnosis not present

## 2015-08-03 DIAGNOSIS — Z7901 Long term (current) use of anticoagulants: Secondary | ICD-10-CM | POA: Diagnosis not present

## 2015-08-07 DIAGNOSIS — Z7901 Long term (current) use of anticoagulants: Secondary | ICD-10-CM | POA: Diagnosis not present

## 2015-08-09 DIAGNOSIS — Z7901 Long term (current) use of anticoagulants: Secondary | ICD-10-CM | POA: Diagnosis not present

## 2015-08-14 DIAGNOSIS — Z1231 Encounter for screening mammogram for malignant neoplasm of breast: Secondary | ICD-10-CM | POA: Diagnosis not present

## 2015-08-15 DIAGNOSIS — Z7901 Long term (current) use of anticoagulants: Secondary | ICD-10-CM | POA: Diagnosis not present

## 2015-08-23 DIAGNOSIS — Z79899 Other long term (current) drug therapy: Secondary | ICD-10-CM | POA: Diagnosis not present

## 2015-08-23 DIAGNOSIS — I483 Typical atrial flutter: Secondary | ICD-10-CM | POA: Diagnosis not present

## 2015-08-23 DIAGNOSIS — I481 Persistent atrial fibrillation: Secondary | ICD-10-CM | POA: Diagnosis not present

## 2015-08-28 DIAGNOSIS — Z5181 Encounter for therapeutic drug level monitoring: Secondary | ICD-10-CM | POA: Diagnosis not present

## 2015-08-28 DIAGNOSIS — Z7901 Long term (current) use of anticoagulants: Secondary | ICD-10-CM | POA: Diagnosis not present

## 2015-08-28 DIAGNOSIS — E559 Vitamin D deficiency, unspecified: Secondary | ICD-10-CM | POA: Diagnosis not present

## 2015-09-06 DIAGNOSIS — Z7901 Long term (current) use of anticoagulants: Secondary | ICD-10-CM | POA: Diagnosis not present

## 2015-09-28 DIAGNOSIS — Z7901 Long term (current) use of anticoagulants: Secondary | ICD-10-CM | POA: Diagnosis not present

## 2015-09-28 DIAGNOSIS — E559 Vitamin D deficiency, unspecified: Secondary | ICD-10-CM | POA: Diagnosis not present

## 2015-10-09 DIAGNOSIS — Z7901 Long term (current) use of anticoagulants: Secondary | ICD-10-CM | POA: Diagnosis not present

## 2015-10-12 DIAGNOSIS — I471 Supraventricular tachycardia: Secondary | ICD-10-CM | POA: Diagnosis not present

## 2015-10-12 DIAGNOSIS — Z950 Presence of cardiac pacemaker: Secondary | ICD-10-CM | POA: Diagnosis not present

## 2015-10-12 DIAGNOSIS — Z45018 Encounter for adjustment and management of other part of cardiac pacemaker: Secondary | ICD-10-CM | POA: Diagnosis not present

## 2015-10-16 DIAGNOSIS — Z7901 Long term (current) use of anticoagulants: Secondary | ICD-10-CM | POA: Diagnosis not present

## 2015-10-25 DIAGNOSIS — I481 Persistent atrial fibrillation: Secondary | ICD-10-CM | POA: Diagnosis not present

## 2015-10-25 DIAGNOSIS — I483 Typical atrial flutter: Secondary | ICD-10-CM | POA: Diagnosis not present

## 2015-10-26 DIAGNOSIS — I471 Supraventricular tachycardia: Secondary | ICD-10-CM | POA: Diagnosis not present

## 2015-10-26 DIAGNOSIS — Z950 Presence of cardiac pacemaker: Secondary | ICD-10-CM | POA: Diagnosis not present

## 2015-10-27 DIAGNOSIS — K5900 Constipation, unspecified: Secondary | ICD-10-CM | POA: Diagnosis not present

## 2015-10-27 DIAGNOSIS — Z7901 Long term (current) use of anticoagulants: Secondary | ICD-10-CM | POA: Diagnosis not present

## 2015-10-29 ENCOUNTER — Encounter (HOSPITAL_COMMUNITY): Payer: Self-pay | Admitting: Oncology

## 2015-10-29 ENCOUNTER — Inpatient Hospital Stay (HOSPITAL_COMMUNITY)
Admission: EM | Admit: 2015-10-29 | Discharge: 2015-10-31 | DRG: 373 | Disposition: A | Discharge status: Home / Self Care | Payer: Medicare Other | Attending: Internal Medicine | Admitting: Internal Medicine

## 2015-10-29 ENCOUNTER — Emergency Department (HOSPITAL_COMMUNITY): Payer: Medicare Other

## 2015-10-29 DIAGNOSIS — Z954 Presence of other heart-valve replacement: Secondary | ICD-10-CM

## 2015-10-29 DIAGNOSIS — T814XXD Infection following a procedure, subsequent encounter: Secondary | ICD-10-CM | POA: Diagnosis not present

## 2015-10-29 DIAGNOSIS — F411 Generalized anxiety disorder: Secondary | ICD-10-CM

## 2015-10-29 DIAGNOSIS — T8149XA Infection following a procedure, other surgical site, initial encounter: Secondary | ICD-10-CM

## 2015-10-29 DIAGNOSIS — R1011 Right upper quadrant pain: Secondary | ICD-10-CM | POA: Diagnosis not present

## 2015-10-29 DIAGNOSIS — Z9049 Acquired absence of other specified parts of digestive tract: Secondary | ICD-10-CM

## 2015-10-29 DIAGNOSIS — I480 Paroxysmal atrial fibrillation: Secondary | ICD-10-CM

## 2015-10-29 DIAGNOSIS — K651 Peritoneal abscess: Principal | ICD-10-CM | POA: Diagnosis present

## 2015-10-29 DIAGNOSIS — Z950 Presence of cardiac pacemaker: Secondary | ICD-10-CM | POA: Diagnosis not present

## 2015-10-29 DIAGNOSIS — T814XXA Infection following a procedure, initial encounter: Secondary | ICD-10-CM | POA: Diagnosis not present

## 2015-10-29 DIAGNOSIS — I4892 Unspecified atrial flutter: Secondary | ICD-10-CM

## 2015-10-29 DIAGNOSIS — Z856 Personal history of leukemia: Secondary | ICD-10-CM | POA: Diagnosis not present

## 2015-10-29 DIAGNOSIS — Z7901 Long term (current) use of anticoagulants: Secondary | ICD-10-CM

## 2015-10-29 DIAGNOSIS — K5900 Constipation, unspecified: Secondary | ICD-10-CM | POA: Diagnosis not present

## 2015-10-29 DIAGNOSIS — T8143XA Infection following a procedure, organ and space surgical site, initial encounter: Secondary | ICD-10-CM

## 2015-10-29 DIAGNOSIS — Z9221 Personal history of antineoplastic chemotherapy: Secondary | ICD-10-CM | POA: Diagnosis not present

## 2015-10-29 DIAGNOSIS — Z79899 Other long term (current) drug therapy: Secondary | ICD-10-CM | POA: Diagnosis not present

## 2015-10-29 DIAGNOSIS — Z952 Presence of prosthetic heart valve: Secondary | ICD-10-CM | POA: Diagnosis not present

## 2015-10-29 DIAGNOSIS — F419 Anxiety disorder, unspecified: Secondary | ICD-10-CM | POA: Diagnosis present

## 2015-10-29 DIAGNOSIS — R109 Unspecified abdominal pain: Secondary | ICD-10-CM | POA: Diagnosis not present

## 2015-10-29 DIAGNOSIS — K352 Acute appendicitis with generalized peritonitis: Secondary | ICD-10-CM | POA: Diagnosis not present

## 2015-10-29 DIAGNOSIS — K3532 Acute appendicitis with perforation and localized peritonitis, without abscess: Secondary | ICD-10-CM | POA: Diagnosis present

## 2015-10-29 HISTORY — DX: Unspecified atrial flutter: I48.92

## 2015-10-29 HISTORY — DX: Bradycardia, unspecified: R00.1

## 2015-10-29 HISTORY — DX: Acute appendicitis with perforation and localized peritonitis, with abscess: K35.33

## 2015-10-29 HISTORY — DX: Presence of prosthetic heart valve: Z95.2

## 2015-10-29 HISTORY — DX: Acute myeloblastic leukemia, not having achieved remission: C92.00

## 2015-10-29 LAB — URINALYSIS, ROUTINE W REFLEX MICROSCOPIC
Bilirubin Urine: NEGATIVE
Glucose, UA: NEGATIVE mg/dL
Ketones, ur: NEGATIVE mg/dL
Leukocytes, UA: NEGATIVE
Nitrite: NEGATIVE
Protein, ur: NEGATIVE mg/dL
Specific Gravity, Urine: 1.007 (ref 1.005–1.030)
pH: 6 (ref 5.0–8.0)

## 2015-10-29 LAB — COMPREHENSIVE METABOLIC PANEL WITH GFR
ALT: 29 U/L (ref 14–54)
AST: 35 U/L (ref 15–41)
Albumin: 4 g/dL (ref 3.5–5.0)
Alkaline Phosphatase: 75 U/L (ref 38–126)
Anion gap: 12 (ref 5–15)
BUN: 17 mg/dL (ref 6–20)
CO2: 23 mmol/L (ref 22–32)
Calcium: 9.3 mg/dL (ref 8.9–10.3)
Chloride: 98 mmol/L — ABNORMAL LOW (ref 101–111)
Creatinine, Ser: 0.86 mg/dL (ref 0.44–1.00)
GFR calc Af Amer: >60 mL/min
GFR calc non Af Amer: >60 mL/min
Glucose, Bld: 140 mg/dL — ABNORMAL HIGH (ref 65–99)
Potassium: 3.9 mmol/L (ref 3.5–5.1)
Sodium: 133 mmol/L — ABNORMAL LOW (ref 135–145)
Total Bilirubin: 1.1 mg/dL (ref 0.3–1.2)
Total Protein: 8 g/dL (ref 6.5–8.1)

## 2015-10-29 LAB — CBC WITH DIFFERENTIAL/PLATELET
Basophils Absolute: 0 K/uL (ref 0.0–0.1)
Basophils Relative: 0 %
Eosinophils Absolute: 0 K/uL (ref 0.0–0.7)
Eosinophils Relative: 0 %
HCT: 40.9 % (ref 36.0–46.0)
Hemoglobin: 13.7 g/dL (ref 12.0–15.0)
Lymphocytes Relative: 6 %
Lymphs Abs: 0.9 K/uL (ref 0.7–4.0)
MCH: 30.8 pg (ref 26.0–34.0)
MCHC: 33.5 g/dL (ref 30.0–36.0)
MCV: 91.9 fL (ref 78.0–100.0)
Monocytes Absolute: 1.2 K/uL — ABNORMAL HIGH (ref 0.1–1.0)
Monocytes Relative: 9 %
Neutro Abs: 11.4 K/uL — ABNORMAL HIGH (ref 1.7–7.7)
Neutrophils Relative %: 85 %
Platelets: 305 K/uL (ref 150–400)
RBC: 4.45 MIL/uL (ref 3.87–5.11)
RDW: 13.3 % (ref 11.5–15.5)
WBC: 13.5 K/uL — ABNORMAL HIGH (ref 4.0–10.5)

## 2015-10-29 LAB — URINE MICROSCOPIC-ADD ON: WBC, UA: NONE SEEN WBC/hpf (ref 0–5)

## 2015-10-29 LAB — LIPASE, BLOOD: Lipase: 28 U/L (ref 11–51)

## 2015-10-29 LAB — PROTIME-INR
INR: 2.91 — ABNORMAL HIGH (ref 0.00–1.49)
Prothrombin Time: 29.9 seconds — ABNORMAL HIGH (ref 11.6–15.2)

## 2015-10-29 MED ORDER — SODIUM CHLORIDE 0.9 % IV SOLN
100.0000 mg | Freq: Every day | INTRAVENOUS | Status: DC
  Administered 2015-10-29 – 2015-10-30 (×2): 100 mg via INTRAVENOUS
  Filled 2015-10-29 (×2): qty 100

## 2015-10-29 MED ORDER — LIP MEDEX EX OINT
1.0000 "application " | TOPICAL_OINTMENT | Freq: Two times a day (BID) | CUTANEOUS | Status: DC
  Administered 2015-10-29 – 2015-10-31 (×4): 1 via TOPICAL
  Filled 2015-10-29: qty 7

## 2015-10-29 MED ORDER — METOPROLOL SUCCINATE ER 25 MG PO TB24
25.0000 mg | ORAL_TABLET | Freq: Every evening | ORAL | Status: DC
  Administered 2015-10-29 – 2015-10-30 (×2): 25 mg via ORAL
  Filled 2015-10-29 (×2): qty 1

## 2015-10-29 MED ORDER — FLEET ENEMA 7-19 GM/118ML RE ENEM
1.0000 | ENEMA | Freq: Once | RECTAL | Status: AC
  Administered 2015-10-29: 1 via RECTAL
  Filled 2015-10-29: qty 1

## 2015-10-29 MED ORDER — MENTHOL 3 MG MT LOZG
1.0000 | LOZENGE | OROMUCOSAL | Status: DC | PRN
  Filled 2015-10-29: qty 9

## 2015-10-29 MED ORDER — SODIUM CHLORIDE 0.9% FLUSH
3.0000 mL | Freq: Two times a day (BID) | INTRAVENOUS | Status: DC
  Administered 2015-10-29 – 2015-10-30 (×2): 3 mL via INTRAVENOUS

## 2015-10-29 MED ORDER — ACETAMINOPHEN 650 MG RE SUPP: 650.0000 mg | Freq: Four times a day (QID) | RECTAL | Status: DC | PRN

## 2015-10-29 MED ORDER — ALBUTEROL SULFATE (2.5 MG/3ML) 0.083% IN NEBU: 2.5000 mg | INHALATION_SOLUTION | Freq: Four times a day (QID) | RESPIRATORY_TRACT | Status: DC | PRN

## 2015-10-29 MED ORDER — ACETAMINOPHEN 325 MG PO TABS: 325.0000 mg | ORAL_TABLET | Freq: Four times a day (QID) | ORAL | Status: DC | PRN

## 2015-10-29 MED ORDER — DIPHENHYDRAMINE HCL 50 MG/ML IJ SOLN: 12.5000 mg | Freq: Four times a day (QID) | INTRAMUSCULAR | Status: DC | PRN

## 2015-10-29 MED ORDER — ONDANSETRON HCL 40 MG/20ML IJ SOLN
8.0000 mg | Freq: Four times a day (QID) | INTRAMUSCULAR | Status: DC | PRN
  Filled 2015-10-29: qty 4

## 2015-10-29 MED ORDER — LACTATED RINGERS IV SOLN
INTRAVENOUS | Status: DC
  Administered 2015-10-29 – 2015-10-30 (×2): via INTRAVENOUS

## 2015-10-29 MED ORDER — FLUTICASONE PROPIONATE 50 MCG/ACT NA SUSP
2.0000 | Freq: Every day | NASAL | Status: DC
  Filled 2015-10-29: qty 16

## 2015-10-29 MED ORDER — MAGIC MOUTHWASH
15.0000 mL | Freq: Four times a day (QID) | ORAL | Status: DC | PRN
  Filled 2015-10-29: qty 15

## 2015-10-29 MED ORDER — SODIUM CHLORIDE 0.9 % IV BOLUS (SEPSIS)
1000.0000 mL | Freq: Once | INTRAVENOUS | Status: AC
  Administered 2015-10-29: 1000 mL via INTRAVENOUS

## 2015-10-29 MED ORDER — ONDANSETRON 4 MG PO TBDP: 4.0000 mg | ORAL_TABLET | Freq: Four times a day (QID) | ORAL | Status: DC | PRN

## 2015-10-29 MED ORDER — SACCHAROMYCES BOULARDII 250 MG PO CAPS
250.0000 mg | ORAL_CAPSULE | Freq: Two times a day (BID) | ORAL | Status: DC
  Administered 2015-10-29 – 2015-10-31 (×4): 250 mg via ORAL
  Filled 2015-10-29 (×4): qty 1

## 2015-10-29 MED ORDER — LORAZEPAM 2 MG/ML IJ SOLN
0.5000 mg | Freq: Three times a day (TID) | INTRAMUSCULAR | Status: DC | PRN
  Administered 2015-10-30: 0.5 mg via INTRAVENOUS
  Administered 2015-10-31: 1 mg via INTRAVENOUS
  Filled 2015-10-29 (×2): qty 1

## 2015-10-29 MED ORDER — BISACODYL 10 MG RE SUPP: 10.0000 mg | Freq: Two times a day (BID) | RECTAL | Status: DC | PRN

## 2015-10-29 MED ORDER — PIPERACILLIN-TAZOBACTAM 3.375 G IVPB
3.3750 g | Freq: Three times a day (TID) | INTRAVENOUS | Status: DC
  Administered 2015-10-29 – 2015-10-31 (×5): 3.375 g via INTRAVENOUS
  Filled 2015-10-29 (×6): qty 50

## 2015-10-29 MED ORDER — METOPROLOL TARTRATE 1 MG/ML IV SOLN: 5.0000 mg | Freq: Four times a day (QID) | INTRAVENOUS | Status: DC | PRN

## 2015-10-29 MED ORDER — METRONIDAZOLE IN NACL 5-0.79 MG/ML-% IV SOLN
500.0000 mg | Freq: Once | INTRAVENOUS | Status: AC
  Administered 2015-10-29: 500 mg via INTRAVENOUS
  Filled 2015-10-29: qty 100

## 2015-10-29 MED ORDER — ADULT MULTIVITAMIN W/MINERALS CH
1.0000 | ORAL_TABLET | Freq: Every day | ORAL | Status: DC
  Administered 2015-10-29 – 2015-10-30 (×2): 1 via ORAL
  Filled 2015-10-29 (×3): qty 1

## 2015-10-29 MED ORDER — DOCUSATE SODIUM 100 MG PO CAPS
100.0000 mg | ORAL_CAPSULE | Freq: Two times a day (BID) | ORAL | Status: DC
  Administered 2015-10-29: 100 mg via ORAL
  Filled 2015-10-29 (×4): qty 1

## 2015-10-29 MED ORDER — MORPHINE SULFATE (PF) 4 MG/ML IV SOLN
4.0000 mg | Freq: Once | INTRAVENOUS | Status: AC
  Administered 2015-10-29: 4 mg via INTRAVENOUS
  Filled 2015-10-29: qty 1

## 2015-10-29 MED ORDER — IOHEXOL 300 MG/ML  SOLN
50.0000 mL | Freq: Once | INTRAMUSCULAR | Status: AC | PRN
  Administered 2015-10-29: 50 mL via ORAL

## 2015-10-29 MED ORDER — LACTATED RINGERS IV BOLUS (SEPSIS)
1000.0000 mL | Freq: Once | INTRAVENOUS | Status: AC
  Administered 2015-10-29: 1000 mL via INTRAVENOUS

## 2015-10-29 MED ORDER — PHENOL 1.4 % MT LIQD
2.0000 | OROMUCOSAL | Status: DC | PRN
  Filled 2015-10-29: qty 177

## 2015-10-29 MED ORDER — ALBUTEROL SULFATE HFA 108 (90 BASE) MCG/ACT IN AERS: 2.0000 | INHALATION_SPRAY | Freq: Four times a day (QID) | RESPIRATORY_TRACT | Status: DC | PRN

## 2015-10-29 MED ORDER — PROMETHAZINE HCL 25 MG/ML IJ SOLN: 6.2500 mg | INTRAMUSCULAR | Status: DC | PRN

## 2015-10-29 MED ORDER — ONDANSETRON HCL 4 MG/2ML IJ SOLN: 4.0000 mg | Freq: Four times a day (QID) | INTRAMUSCULAR | Status: DC | PRN

## 2015-10-29 MED ORDER — LORAZEPAM 1 MG PO TABS
1.0000 mg | ORAL_TABLET | Freq: Every day | ORAL | Status: DC
  Administered 2015-10-29 – 2015-10-30 (×2): 1 mg via ORAL
  Filled 2015-10-29 (×2): qty 1

## 2015-10-29 MED ORDER — ONDANSETRON HCL 4 MG/2ML IJ SOLN
4.0000 mg | Freq: Once | INTRAMUSCULAR | Status: AC
  Administered 2015-10-29: 4 mg via INTRAVENOUS
  Filled 2015-10-29: qty 2

## 2015-10-29 MED ORDER — IOHEXOL 300 MG/ML  SOLN
100.0000 mL | Freq: Once | INTRAMUSCULAR | Status: AC | PRN
  Administered 2015-10-29: 100 mL via INTRAVENOUS

## 2015-10-29 MED ORDER — ALUM & MAG HYDROXIDE-SIMETH 200-200-20 MG/5ML PO SUSP: 30.0000 mL | Freq: Four times a day (QID) | ORAL | Status: DC | PRN

## 2015-10-29 MED ORDER — DEXTROSE 5 % IV SOLN
2.0000 g | Freq: Once | INTRAVENOUS | Status: AC
  Administered 2015-10-29: 2 g via INTRAVENOUS
  Filled 2015-10-29: qty 2

## 2015-10-29 NOTE — Progress Notes (Signed)
Received pt from ED, telemetry applied, VS obtained, oriented to unit, call light placed with in reach

## 2015-10-29 NOTE — ED Provider Notes (Signed)
CSN: [id number]     Arrival date & time 10/29/15  0515 History   First MD Initiated Contact with Patient 10/29/15 [id number]     Chief Complaint  Patient presents with  . Constipation     (Consider location/radiation/quality/duration/timing/severity/associated sxs/prior Treatment) HPI   Mercedes Thompson is a 70 y.o F with a pmhx of atrial fibrillation s/p cardiac ablation and pacemaker implantation, mechanical mitral valve replacement on coumadin, leukemia in 1997 who presents the emergency department complaining of constipation. Patient states that one month ago she began noticing that her stool was harder than normal. She tried taking stool softeners at that time which provided minimal relief. Patient's bowel movements became less frequent, now every other day and her very hard and painful. Patient was seen by PCP last week who prescribed MiraLAX which she has been taking one capful per day with minimal relief. Last bowel movement was 2 days ago. Patient now having lower abdominal cramping and early satiety. Patient also states that last night she felt nauseated and had 1 episode of nonbloody, nonbilious vomiting. Patient denies narcotic use. Patient reports a low fiber diet. No new medications. No history of constipation. She denies dysuria, melena, hematochezia, fever, chills, chest pain, shortness of breath.  Past Medical History  Diagnosis Date  . Anxiety   . Leukemia ([place]) 1987   Past Surgical History  Procedure Laterality Date  . Tubal ligation    . Mitral valve replacement  1986  . Heart ablation    . Appendectomy    . Pacemaker insertion     Family History  Problem Relation Age of Onset  . Depression Mother   . Cancer Father     LUNG   Social History  Substance Use Topics  . Smoking status: Never Smoker   . Smokeless tobacco: Never Used  . Alcohol Use: No     Comment: rare   OB History    No data available     Review of Systems  All other systems reviewed and are  negative.     Allergies  Sulfa antibiotics and Decongest-aid  Home Medications   Prior to Admission medications   Medication Sig Start Date End Date Taking? Authorizing Provider  cholecalciferol (VITAMIN D) 1000 UNITS tablet Take 1,000 Units by mouth daily.    Historical Provider, MD  ciprofloxacin (CIPRO) 500 MG tablet Take 1 tablet (500 mg total) by mouth 2 (two) times daily. 01/14/13   Erby Pian, FNP  escitalopram (LEXAPRO) 10 MG tablet  10/23/12   Historical Provider, MD  fish oil-omega-3 fatty acids 1000 MG capsule Take 2 g by mouth daily.    Historical Provider, MD  LORazepam (ATIVAN) 1 MG tablet  11/23/12   Historical Provider, MD  Multiple Vitamin (MULTIVITAMIN) tablet Take 1 tablet by mouth daily.    Historical Provider, MD  SOTALOL AF 120 MG TABS  12/03/12   Historical Provider, MD  warfarin (COUMADIN) 5 MG tablet  10/29/12   Historical Provider, MD   BP 134/77 mmHg  Pulse 92  Temp(Src) 98.3 F (36.8 C) (Oral)  Resp 17  Ht 5\' 7"  (1.702 m)  Wt 94.802 kg  BMI 32.73 kg/m2  SpO2 96% Physical Exam  Constitutional: She is oriented to person, place, and time. She appears well-developed and well-nourished. No distress.  HENT:  Head: Normocephalic and atraumatic.  Mouth/Throat: No oropharyngeal exudate.  Eyes: Conjunctivae and EOM are normal. Pupils are equal, round, and reactive to light. Right eye exhibits no discharge. Left eye  exhibits no discharge. No scleral icterus.  Cardiovascular: Normal rate, regular rhythm, normal heart sounds and intact distal pulses.  Exam reveals no gallop and no friction rub.   No murmur heard. Pulmonary/Chest: Effort normal and breath sounds normal. No respiratory distress. She has no wheezes. She has no rales. She exhibits no tenderness.  Abdominal: Soft. Bowel sounds are normal. She exhibits no distension and no mass. There is tenderness ( RLQ TTP). There is no rebound and no guarding.  Musculoskeletal: Normal range of motion. She exhibits  no edema.  Neurological: She is alert and oriented to person, place, and time.  Strength 5/5 throughout. No sensory deficits.    Skin: Skin is warm and dry. No rash noted. She is not diaphoretic. No erythema. No pallor.  Psychiatric: She has a normal mood and affect. Her behavior is normal.  Nursing note and vitals reviewed.   ED Course  Procedures (including critical care time) Labs Review Labs Reviewed - No data to display  Imaging Review Dg Abd 1 View  10/29/2015  CLINICAL DATA:  Lower abdominal pain and constipation for 1 month EXAM: ABDOMEN - 1 VIEW COMPARISON:  None. FINDINGS: There is moderate stool throughout colon. There is postoperative change in the right abdomen. There is no bowel dilatation or air-fluid level suggesting obstruction. No free air is seen on this supine examination. There are phleboliths in the pelvis. IMPRESSION: Bowel gas pattern unremarkable. No obstruction or free air. Moderate stool in colon. Electronically Signed   By: Lowella Grip III M.D.   On: 10/29/2015 07:15   Ct Abdomen Pelvis W Contrast  10/29/2015  CLINICAL DATA:  Right upper quadrant abdominal pain, constipation, prior appendectomy EXAM: CT ABDOMEN AND PELVIS WITH CONTRAST TECHNIQUE: Multidetector CT imaging of the abdomen and pelvis was performed using the standard protocol following bolus administration of intravenous contrast. CONTRAST:  171mL OMNIPAQUE IOHEXOL 300 MG/ML  SOLN COMPARISON:  None. FINDINGS: Lower chest:  Lung bases are clear. Cardiomegaly. Mitral valve annular calcifications. Pacemaker leads, incompletely visualized. Hepatobiliary: Liver is within normal limits. No suspicious/enhancing hepatic lesions. Gallbladder is unremarkable. No intrahepatic or extrahepatic ductal dilatation. Pancreas: Within normal limits. Spleen: Within normal limits. Adrenals/Urinary Tract: Adrenal glands are within normal limits. Kidneys within normal limits.  No hydronephrosis. Bladder is within normal  limits. Stomach/Bowel: Stomach is within normal limits. No evidence of bowel obstruction. Prior appendectomy. Significant inflammatory changes involving a short segment of distal ileum with wall thickening, 1.7 x 2.0 x 3.4 cm intramural fluid and gas collection (series 2/image 54), and adjacent mesenteric inflammatory changes (series 2/image 50). Curvilinear hyperdensity adjacent to the collection may reflect a suture line from prior surgery (series 2/image 53), less likely a foreign body. No free air. Sigmoid diverticulosis, without evidence of diverticulitis. Vascular/Lymphatic: No evidence of abdominal aortic aneurysm. No suspicious abdominopelvic lymphadenopathy. Reproductive: Uterus is within normal limits. Bilateral ovaries are within normal limits. Other: No abdominopelvic ascites. Musculoskeletal: Degenerative changes of the visualized thoracolumbar spine. IMPRESSION: No evidence of bowel obstruction.  Prior appendectomy. Inflammatory changes involving the distal ileum with associated 1.7 x 2.0 x 3.4 cm intramural fluid and gas collection, possibly reflecting a developing intramural abscess. Surrounding inflammatory changes. No free air. Differential considerations include infectious/inflammatory ileitis or possibly localized perforation secondary to foreign body or anastomotic breakdown. Electronically Signed   By: Julian Hy M.D.   On: 10/29/2015 12:42   I have personally reviewed and evaluated these images and lab results as part of my medical decision-making.   EKG  Interpretation None      MDM   Final diagnoses:  Intra-abdominal abscess [facility])    70 year old female presents with complaints of constipation on supplemental month ago despite treatments of MiraLAX and stool softeners. Patient appears well, nontoxic, nonseptic appearing. VITAL signs are stable. Will obtain abdominal x-ray and administer enema. Pt does have abdominal TTP in the right mid abdomen. No distention noted.    Pt given enema with no relief of symptoms. Abdominal films reveal moderate stool in colon. Given persistent symptoms, will perform advanced workup including CT abd pelvis to r/o SBO, blood work. Pt given IV fluids, zofran and pain medication with significant pain relief after intervention.    Lab work reveals leukocytosis, WBC 13.5. No other lab abnormalities. CT reveals inflammatory changes involving the distal ileum with associated [phone] cm intramural fluid and gas collection, possibly reflecting a developing intramural abscess. Surrounding inflammatory changes. Patient has no history of Crohn's disease. Patient has multiple colonoscopies with no evidence of Crohn's abnormalities. Patient did have an emergency appendectomy in July 2016[facility]. Patient denies melena, hematochezia. We'll consult Gen. surgery. Patient given ceftriaxone and metronidazole. We will obtain PT and INR S patient is on Coumadin for a mechanical mitral valve replacement.  Pt is therapeutic on her coumadin.  Dr. 05-19-2002 spoke with Dr. Darl Householder with general surgery who recommended that we contact [facility] however, patient does not want to go to [facility]. Will consult GI per his recommendation to hospital medicine. He will consult patient on the floor.  Spoke with [facility]. [facility] with GI who will consult patient on the floor after hospitalist admission. Spoke with hospitalist to admit patient to her service. Patient is hemodynamically stable and awaiting bed.  Patient was discussed with and seen by Dr. Amedeo Plenty who agrees with the treatment plan.      Darl Householder [employer], PA-C 10/29/15 [place] Y[place], MD 10/30/15 [phone]

## 2015-10-29 NOTE — ED Notes (Signed)
Patient transported to X-ray 

## 2015-10-29 NOTE — ED Notes (Signed)
Patient was given enema and was instructed correctly to lie on left side for at least five minutes, longer if tolerated.  Patient verbalized understanding.

## 2015-10-29 NOTE — ED Notes (Signed)
Pt presents d/t constipation.  Was seen earlier in the week by her PCP that rx miralax however that has not been effective.

## 2015-10-29 NOTE — ED Notes (Signed)
Patient was given first enema and told to lie on her back for five minutes.  Patient was unable to retain any fluid from enema and had very little stool output.  PA notified.

## 2015-10-29 NOTE — Progress Notes (Signed)
ANTICOAGULATION CONSULT NOTE - Initial Consult  Pharmacy Consult for heparin Indication: mechanical mitral valve  Allergies  Allergen Reactions  . Sulfa Antibiotics     AFFECTS COUMADIN  . Decongest-Aid [Pseudoephedrine] Palpitations    Patient Measurements: Height: 5\' 7"  (170.2 cm) Weight: 209 lb (94.802 kg) IBW/kg (Calculated) : 61.6 Heparin Dosing Weight: 82 kg  Vital Signs: BP: 120/65 mmHg (02/12 1616) Pulse Rate: 80 (02/12 1616)  Labs:  Recent Labs  10/29/15 1015 10/29/15 1418  HGB 13.7  --   HCT 40.9  --   PLT 305  --   LABPROT  --  29.9*  INR  --  2.91*  CREATININE 0.86  --     Estimated Creatinine Clearance: 73 mL/min (by C-G formula based on Cr of 0.86).   Medical History: Past Medical History  Diagnosis Date  . Anxiety   . AML (acute myeloblastic leukemia) ([place]) 1987  . H/O mitral valve replacement with mechanical valve     coumadin  . Bradycardia 2015    s/p PPM insertion  . Atrial flutter ([place]) 2016    s/p cardioversion 02/2015, previously in [place], recently taken off amiodarone  . Appendicitis with peritoneal abscess 03/2015    s/p ex lap at [facility]      Assessment: 39 yoF on chronic warfarin for mechanical mitral valve replacement. Cardiac history also significant for atrial flutter and fibrillation s/p numerous direct cardioversions and finally s/p atrial ablation in 06/2015 at [facility] recently tapered off of amiodarone, bradycardia s/p PPM.  Patient admitted with intra-abdominal abscess post appendectomy in July 2016.  Holding warfarin and allowing INR to drift down for potential procedure.  Pharmacy consulted to start heparin infusion once INR<2.5.  General surgery and IR consulted.  INR 2.91 today Hgb, Platelets WNL CrCl~73 ml/min  Goal of Therapy:  Heparin level 0.3-0.7 units/ml INR 2.5-3.5 Monitor platelets by anticoagulation protocol: Yes   Plan:  F/u INR in AM to determine start of heparin infusion. Order baseline aPTT with  AM labs as well.  Hershal Coria 10/29/2015,5:36 PM

## 2015-10-29 NOTE — Consult Note (Signed)
[facility]  [address] [place], [place] [address] Phone: [phone] FAX: [phone]     Mercedes Thompson  Apr 04, 1946 [id number]  CARE TEAM:  PCP: Jonathon Bellows, MD  Outpatient Care Team: Patient Care Team: Maurice Small, MD as PCP - General (Family Medicine) Anette Guarneri, MD as Consulting Physician (Surgical Oncology) Bailey Mech as Consulting Physician (Cardiology)  Inpatient Treatment Team: Treatment Team: Attending Provider: Wandra Arthurs, MD; Physician Assistant: Carlos Levering, PA-C; Registered Nurse: Veatrice Kells, RN; Technician: Lorrene Reid, NT; Registered Nurse: Lewis Shock, RN; Consulting Physician: Nolon Nations, MD  This patient is a 70 y.o.female who presents today for surgical evaluation at the request of Dr Darl Householder.   Reason for evaluation: Abdominal abscess.  Pleasant inquisitive woman.  Comes today with her husband.    Had perforated appendicitis that required urgent appendectomy and prolonged hospital stay [facility] last summer, July.  She felt vague abdominal pain for a week before she came in to the hospital with an obvious perforation.  Eventually recovered and no issues postoperatively.  She is fully anticoagulated given her history of a heart valve as well as dysrhythmia issues.  Status post ablation at [facility].  Pacemaker.  Has not had any other cardiac events.  No history of strokes.  Can walk half hour without difficulty.  One-week history of intermittent crampy abdominal pain.  Month history of worsening constipation.  Exercising more.  Worsening cramps after working out in the gym last week.  Persisted.  Worsening constipation.  Started on some MiraLax.  Pain became more intense and crampy.  Worsening cramping and retching.  Worsening nausea.  Developed emesis.  Seems pain became worse in the right lower side.  Came to the emergency room.  Concerning.  CT scan shows  fluid collection on staple line of appendectomy on right lower colon.  Surgical consultation requested.  No personal nor family history of GI/colon cancer, inflammatory bowel disease, irritable bowel syndrome, allergy such as Celiac Sprue, dietary/dairy problems, colitis, ulcers nor gastritis.  No recent sick contacts/gastroenteritis.  No travel outside the country.  No changes in diet.  No dysphagia to solids or liquids.  No significant heartburn or reflux.  No hematochezia, hematemesis, coffee ground emesis.  No evidence of prior gastric/peptic ulceration.  Recalls being told she had diverticulosis on a colonoscopy a few years ago.  Never polyps or cancer.    Past Medical History  Diagnosis Date  . Anxiety   . Leukemia ([place]) 1987    Past Surgical History  Procedure Laterality Date  . Tubal ligation    . Mitral valve replacement  1986  . Heart ablation    . Appendectomy  03/24/2015    [facility] Dr Radene Knee.  perforated appendicitis  . Pacemaker insertion      Social History   Social History  . Marital Status: Married    Spouse Name: N/A  . Number of Children: N/A  . Years of Education: N/A   Occupational History  . Not on file.   Social History Main Topics  . Smoking status: Never Smoker   . Smokeless tobacco: Never Used  . Alcohol Use: No     Comment: rare  . Drug Use: No  . Sexual Activity: Not on file   Other Topics Concern  . Not on file   Social History Narrative    Family History  Problem Relation Age of Onset  .  Depression Mother   . Cancer Father     LUNG    Current Facility-Administered Medications  Medication Dose Route Frequency Provider Last Rate Last Dose  . cefTRIAXone (ROCEPHIN) 2 g in dextrose 5 % 50 mL IVPB  2 g Intravenous Once [employer], PA-C       And  . metroNIDAZOLE (FLAGYL) IVPB 500 mg  500 mg Intravenous Once Samantha Tripp Dowless, PA-C       Current Outpatient Prescriptions  Medication Sig Dispense Refill  . albuterol  (PROVENTIL HFA;VENTOLIN HFA) 108 (90 Base) MCG/ACT inhaler Inhale 2 puffs into the lungs every 6 (six) hours as needed for wheezing or shortness of breath.    . calcium-vitamin D (OSCAL WITH D) 500-200 MG-UNIT tablet Take 1 tablet by mouth daily with breakfast.    . cholecalciferol (VITAMIN D) 1000 UNITS tablet Take 1,000 Units by mouth daily.    . fluticasone (FLONASE) 50 MCG/ACT nasal spray Place 2 sprays into the nose daily.     . furosemide (LASIX) 20 MG tablet Take 20 mg by mouth daily as needed for fluid (for fluid retention over 2 lbs).     . LORazepam (ATIVAN) 1 MG tablet Take 1 mg by mouth at bedtime.     . metoprolol succinate (TOPROL-XL) 25 MG 24 hr tablet Take 25 mg by mouth every evening.     . Multiple Vitamin (MULTIVITAMIN) tablet Take 1 tablet by mouth daily.    . Probiotic Product (PROBIOTIC ADVANCED PO) Take 1 capsule by mouth daily.     Marland Kitchen warfarin (COUMADIN) 5 MG tablet Take 7.5-10 mg by mouth every morning. Take 1 and one-half tablets (7.5 mg) on Thursdays, Saturdays and Sundays. Take 2 tablets (10 mg) on all other days    . amiodarone (PACERONE) 200 MG tablet Reported on 10/29/2015    . ciprofloxacin (CIPRO) 500 MG tablet Take 1 tablet (500 mg total) by mouth 2 (two) times daily. (Patient not taking: Reported on 10/29/2015) 20 tablet 0     Allergies  Allergen Reactions  . Sulfa Antibiotics     AFFECTS COUMADIN  . Decongest-Aid [Pseudoephedrine] Palpitations    ROS: Constitutional:  No fevers, chills, sweats.  Weight stable Eyes:  No vision changes, No discharge HENT:  No sore throats, nasal drainage Lymph: No neck swelling, No bruising easily Pulmonary:  No cough, productive sputum CV: No orthopnea, PND  Patient walks 30 minutes for about 1 miles without difficulty.  No exertional chest/neck/shoulder/arm pain. GI: No personal nor family history of GI/colon cancer, inflammatory bowel disease, irritable bowel syndrome, allergy such as Celiac Sprue, dietary/dairy problems,  colitis, ulcers nor gastritis.  No recent sick contacts/gastroenteritis.  No travel outside the country.  No changes in diet. Renal: No UTIs, No hematuria Genital:  No drainage, bleeding, masses Musculoskeletal: No severe joint pain.  Good ROM major joints Skin:  No sores or lesions.  No rashes Heme/Lymph:  No easy bleeding.  No swollen lymph nodes Neuro: No focal weakness/numbness.  No seizures Psych: No suicidal ideation.  No hallucinations  BP 124/74 mmHg  Pulse 82  Temp(Src) 98.3 F (36.8 C) (Oral)  Resp 15  Ht '5\' 7"'$  (1.702 m)  Wt 94.802 kg (209 lb)  BMI 32.73 kg/m2  SpO2 95%  Physical Exam: General: Pt awake/alert/oriented x4 in no major acute distress Eyes: PERRL, normal EOM. Sclera nonicteric Neuro: CN II-XII intact w/o focal sensory/motor deficits. Lymph: No head/neck/groin lymphadenopathy Psych:  No delerium/psychosis/paranoia HENT: Normocephalic, Mucus membranes moist.  No  thrush Neck: Supple, No tracheal deviation Chest: No pain.  Good respiratory excursion. CV:  Pulses intact.  Regular rhythm Abdomen: Soft, Obese.  Mildly distended.  Vague diffuse tenderness but focal guarding and sharp tenderness at right lower quadrant.  No incarcerated hernias. Ext:  SCDs BLE.  No significant edema.  No cyanosis Skin: No petechiae / purpurea.  No major sores Musculoskeletal: No severe joint pain.  Good ROM major joints   Results:   Labs: Results for orders placed or performed during the hospital encounter of 10/29/15 (from the past 48 hour(s))  CBC with Differential     Status: Abnormal   Collection Time: 10/29/15 10:15 AM  Result Value Ref Range   WBC 13.5 (H) 4.0 - 10.5 K/uL   RBC 4.45 3.87 - 5.11 MIL/uL   Hemoglobin 13.7 12.0 - 15.0 g/dL   HCT 40.9 36.0 - 46.0 %   MCV 91.9 78.0 - 100.0 fL   MCH 30.8 26.0 - 34.0 pg   MCHC 33.5 30.0 - 36.0 g/dL   RDW 13.3 11.5 - 15.5 %   Platelets 305 150 - 400 K/uL   Neutrophils Relative % 85 %   Neutro Abs 11.4 (H) 1.7 - 7.7 K/uL    Lymphocytes Relative 6 %   Lymphs Abs 0.9 0.7 - 4.0 K/uL   Monocytes Relative 9 %   Monocytes Absolute 1.2 (H) 0.1 - 1.0 K/uL   Eosinophils Relative 0 %   Eosinophils Absolute 0.0 0.0 - 0.7 K/uL   Basophils Relative 0 %   Basophils Absolute 0.0 0.0 - 0.1 K/uL  Comprehensive metabolic panel     Status: Abnormal   Collection Time: 10/29/15 10:15 AM  Result Value Ref Range   Sodium 133 (L) 135 - 145 mmol/L   Potassium 3.9 3.5 - 5.1 mmol/L   Chloride 98 (L) 101 - 111 mmol/L   CO2 23 22 - 32 mmol/L   Glucose, Bld 140 (H) 65 - 99 mg/dL   BUN 17 6 - 20 mg/dL   Creatinine, Ser 0.86 0.44 - 1.00 mg/dL   Calcium 9.3 8.9 - 10.3 mg/dL   Total Protein 8.0 6.5 - 8.1 g/dL   Albumin 4.0 3.5 - 5.0 g/dL   AST 35 15 - 41 U/L   ALT 29 14 - 54 U/L   Alkaline Phosphatase 75 38 - 126 U/L   Total Bilirubin 1.1 0.3 - 1.2 mg/dL   GFR calc non Af Amer >60 >60 mL/min   GFR calc Af Amer >60 >60 mL/min    Comment: (NOTE) The eGFR has been calculated using the CKD EPI equation. This calculation has not been validated in all clinical situations. eGFR's persistently <60 mL/min signify possible Chronic Kidney Disease.    Anion gap 12 5 - 15  Lipase, blood     Status: None   Collection Time: 10/29/15 10:15 AM  Result Value Ref Range   Lipase 28 11 - 51 U/L  Urinalysis, Routine w reflex microscopic (not at [facility]     Status: Abnormal   Collection Time: 10/29/15 11:00 AM  Result Value Ref Range   Color, Urine YELLOW YELLOW   APPearance CLEAR CLEAR   Specific Gravity, Urine 1.007 1.005 - 1.030   pH 6.0 5.0 - 8.0   Glucose, UA NEGATIVE NEGATIVE mg/dL   Hgb urine dipstick TRACE (A) NEGATIVE   Bilirubin Urine NEGATIVE NEGATIVE   Ketones, ur NEGATIVE NEGATIVE mg/dL   Protein, ur NEGATIVE NEGATIVE mg/dL   Nitrite NEGATIVE NEGATIVE  Leukocytes, UA NEGATIVE NEGATIVE  Urine microscopic-add on     Status: Abnormal   Collection Time: 10/29/15 11:00 AM  Result Value Ref Range   Squamous Epithelial / LPF 0-5  (A) NONE SEEN   WBC, UA NONE SEEN 0 - 5 WBC/hpf   RBC / HPF 0-5 0 - 5 RBC/hpf   Bacteria, UA RARE (A) NONE SEEN    Imaging / Studies: Dg Abd 1 View  10/29/2015  CLINICAL DATA:  Lower abdominal pain and constipation for 1 month EXAM: ABDOMEN - 1 VIEW COMPARISON:  None. FINDINGS: There is moderate stool throughout colon. There is postoperative change in the right abdomen. There is no bowel dilatation or air-fluid level suggesting obstruction. No free air is seen on this supine examination. There are phleboliths in the pelvis. IMPRESSION: Bowel gas pattern unremarkable. No obstruction or free air. Moderate stool in colon. Electronically Signed   By: Lowella Grip III M.D.   On: 10/29/2015 07:15   Ct Abdomen Pelvis W Contrast  10/29/2015  CLINICAL DATA:  Right upper quadrant abdominal pain, constipation, prior appendectomy EXAM: CT ABDOMEN AND PELVIS WITH CONTRAST TECHNIQUE: Multidetector CT imaging of the abdomen and pelvis was performed using the standard protocol following bolus administration of intravenous contrast. CONTRAST:  182m OMNIPAQUE IOHEXOL 300 MG/ML  SOLN COMPARISON:  None. FINDINGS: Lower chest:  Lung bases are clear. Cardiomegaly. Mitral valve annular calcifications. Pacemaker leads, incompletely visualized. Hepatobiliary: Liver is within normal limits. No suspicious/enhancing hepatic lesions. Gallbladder is unremarkable. No intrahepatic or extrahepatic ductal dilatation. Pancreas: Within normal limits. Spleen: Within normal limits. Adrenals/Urinary Tract: Adrenal glands are within normal limits. Kidneys within normal limits.  No hydronephrosis. Bladder is within normal limits. Stomach/Bowel: Stomach is within normal limits. No evidence of bowel obstruction. Prior appendectomy. Significant inflammatory changes involving a short segment of distal ileum with wall thickening, 1.7 x 2.0 x 3.4 cm intramural fluid and gas collection (series 2/image 54), and adjacent mesenteric inflammatory  changes (series 2/image 50). Curvilinear hyperdensity adjacent to the collection may reflect a suture line from prior surgery (series 2/image 53), less likely a foreign body. No free air. Sigmoid diverticulosis, without evidence of diverticulitis. Vascular/Lymphatic: No evidence of abdominal aortic aneurysm. No suspicious abdominopelvic lymphadenopathy. Reproductive: Uterus is within normal limits. Bilateral ovaries are within normal limits. Other: No abdominopelvic ascites. Musculoskeletal: Degenerative changes of the visualized thoracolumbar spine. IMPRESSION: No evidence of bowel obstruction.  Prior appendectomy. Inflammatory changes involving the distal ileum with associated 1.7 x 2.0 x 3.4 cm intramural fluid and gas collection, possibly reflecting a developing intramural abscess. Surrounding inflammatory changes. No free air. Differential considerations include infectious/inflammatory ileitis or possibly localized perforation secondary to foreign body or anastomotic breakdown. Electronically Signed   By: SJulian HyM.D.   On: 10/29/2015 12:42    Medications / Allergies: per chart  Antibiotics: Anti-infectives    Start     Dose/Rate Route Frequency Ordered Stop   10/29/15 1315  cefTRIAXone (ROCEPHIN) 2 g in dextrose 5 % 50 mL IVPB     2 g 100 mL/hr over 30 Minutes Intravenous  Once 10/29/15 1300     10/29/15 1315  metroNIDAZOLE (FLAGYL) IVPB 500 mg     500 mg 100 mL/hr over 60 Minutes Intravenous  Once 10/29/15 1300        Assessment  SAkita Maxim 70y.o. female       Problem List:  Principal Problem:   Intra-abdominal abscess post-lap appy July 2016 Active Problems:   Long term  current use of anticoagulant   Anxiety disorder   Artificial cardiac pacemaker   H/O mitral valve replacement with mechanical valve   Appendicitis with perforation s/p lap appy 03/22/2015   Intra-abdominal abscess near staple line of prior appendectomy.  Suspicious for delayed  abscess.  Plan:  Obvious abscess near staple line of the appendix.  It would be quite unusual for a post appendectomy abscess now 7 months out from surgery.  Nonetheless rest of differential diagnosis seems unlikely.  Hopefully can cool down with IV antibiotics.  May need percutaneous drainage.     Would hold anticoagulation in possible anticipation of that.  Temporary IV heparin if concerns extremely high but patient has never had a history of stroke or cardiac events.  I spent about 45 minutes talking about the pathophysiology and options.  Patient very anxious and concerned given her cardiac issues managed at prior academic centers.  However stable with ablation.  Good exercise tolerance.  Did note she could get a second opinion and go back to [facility] or [place].  She seems mostly comfortable with [place] at this time.  I think she is anxious given her medical issues in the past and the fact that she is new to this city.  Admit.  IV antibiotics.  We do broad coverage and antifungal coverage.  Hold anticoagulation in anticipation of possible percutaneous drainage or surgery.  Medicine admission given cardiac issues.  Cardiology consult when necessary.  VTE prophylaxis- SCDs, etc  Mobilize as tolerated to help recovery    Adin Hector, M.D., F.A.C.S. Gastrointestinal and Minimally Invasive Surgery [facility] [address] [place], [place] [address] [phone] Main / Paging   10/29/2015  Note: Portions of this report may have been transcribed using voice recognition software. Every effort was made to ensure accuracy; however, inadvertent computerized transcription errors may be present.   Any transcriptional errors that result from this process are unintentional.

## 2015-10-29 NOTE — ED Notes (Signed)
RN GETTING BLOOD WITH IV START PER PT. PT REFUSED FOR ME TO DRAW LABS

## 2015-10-29 NOTE — H&P (Addendum)
[facility] History and Physical  Mercedes Thompson [id number] DOB: Jun 08, 1946 DOA: 10/29/2015  Referring physician:  Donnald Garre PCP:  Jonathon Bellows, MD   Chief Complaint:  Abdominal pain  HPI:  The patient is a 70 y.o. year-old female with history of AML s/p chemotherapy in 1987, history of MV regurgitation s/p mechanical mitral valve replacement on chronic anticoagulation with warfarin, anxiety, atrial flutter and fibrillation s/p numerous direct cardioversions and finally s/p atrial ablation in 06/2015 at [facility] recently tapered off of amiodarone, bradycardia s/p PPM, and appendicitis with peritoneal abscess in 03/2015 which was treated at [facility] who presents with a month history of worsening constipation.  She had some right lower quadrant pain that started approximately one week prior to admission that improved somewhat with heating pad.  She attributed her pain to constipation and was seen by her PCP who recommended miralax.  She had a slightly larger stool yesterday but because her pain was persistent, she came to the emergency department.  One episode of emesis the night prior to admission, nonbilious, nonbloody.  No blood or mucous in her stools.  Denies fevers, chills.  No family history of crohn's or colon cancer.  Last colonoscopy was 2-4 years ago.  She has had one polyp removed which was not precancerous.    In the ER, several enemas did not improve her pain so she underwent further testing.  Her VSS, labs notable for mild leukocytosis to 13.5.  CT demonstrated inflammatory changes involving the distal ileum with associated 1.7 x 2 x 3.4 cm intramural fluid and gas collection, possibly reflecting a developing intramural abscess with surrounding inflammatory changes.  INR 2.91.  Mild hyponatremia and hyperglycemia.  She was started on ceftriaxone and flagyl by the ER MD and general surgery has been consulted and recommending conservative management with antibiotics.    Review  of Systems:  General:  Denies fevers, chills, weight loss or gain HEENT:  Denies changes to hearing and vision, rhinorrhea, sinus congestion, sore throat CV:  Denies chest pain and palpitations, lower extremity edema.  PULM:  Denies SOB, wheezing, cough.   GI:  Per HPI   GU:  Denies dysuria, frequency, urgency ENDO:  Denies polyuria, polydipsia.   HEME:  Denies hematemesis, blood in stools, melena, abnormal bruising or bleeding.  LYMPH:  Denies lymphadenopathy.   MSK:  Denies arthralgias, myalgias.   DERM:  Denies skin rash or ulcer.   NEURO:  Denies focal numbness, weakness, slurred speech, confusion, facial droop.  PSYCH:  Denies anxiety and depression.    Past Medical History  Diagnosis Date  . Anxiety   . AML (acute myeloblastic leukemia) ([place]) 1987  . H/O mitral valve replacement with mechanical valve     coumadin  . Bradycardia 2015    s/p PPM insertion  . Atrial flutter ([employer]) 2016    s/p cardioversion 02/2015, previously in [place], recently taken off amiodarone  . Appendicitis with peritoneal abscess 03/2015    s/p ex lap at [facility]   Past Surgical History  Procedure Laterality Date  . Tubal ligation    . Mitral valve replacement  1986  . Heart ablation    . Appendectomy  03/24/2015    [facility] Dr Radene Knee.  perforated appendicitis  . Pacemaker insertion     Social History:  reports that she has never smoked. She has never used smokeless tobacco. She reports that she does not drink alcohol or use illicit drugs.  Allergies  Allergen Reactions  . Sulfa  Antibiotics     AFFECTS COUMADIN  . Decongest-Aid [Pseudoephedrine] Palpitations    Family History  Problem Relation Age of Onset  . Depression Mother   . Cancer Father     LUNG  . Ulcerative colitis Neg Hx   . Crohn's disease Neg Hx   . Colon polyps Father      Prior to Admission medications   Medication Sig Start Date End Date Taking? Authorizing Provider  albuterol (PROVENTIL HFA;VENTOLIN HFA) 108 (90 Base)  MCG/ACT inhaler Inhale 2 puffs into the lungs every 6 (six) hours as needed for wheezing or shortness of breath.   Yes Historical Provider, MD  calcium-vitamin D (OSCAL WITH D) 500-200 MG-UNIT tablet Take 1 tablet by mouth daily with breakfast.   Yes Historical Provider, MD  cholecalciferol (VITAMIN D) 1000 UNITS tablet Take 1,000 Units by mouth daily.   Yes Historical Provider, MD  fluticasone (FLONASE) 50 MCG/ACT nasal spray Place 2 sprays into the nose daily.    Yes Historical Provider, MD  furosemide (LASIX) 20 MG tablet Take 20 mg by mouth daily as needed for fluid (for fluid retention over 2 lbs).  07/03/14  Yes Historical Provider, MD  LORazepam (ATIVAN) 1 MG tablet Take 1 mg by mouth at bedtime.  11/23/12  Yes Historical Provider, MD  metoprolol succinate (TOPROL-XL) 25 MG 24 hr tablet Take 25 mg by mouth every evening.  10/25/15 10/24/16 Yes Historical Provider, MD  Multiple Vitamin (MULTIVITAMIN) tablet Take 1 tablet by mouth daily.   Yes Historical Provider, MD  Probiotic Product (PROBIOTIC ADVANCED PO) Take 1 capsule by mouth daily.    Yes Historical Provider, MD  warfarin (COUMADIN) 5 MG tablet Take 7.5-10 mg by mouth every morning. Take 1 and one-half tablets (7.5 mg) on Thursdays, Saturdays and Sundays. Take 2 tablets (10 mg) on all other days 10/29/12  Yes Historical Provider, MD  amiodarone (PACERONE) 200 MG tablet Reported on 10/29/2015 08/23/15   Historical Provider, MD   Physical Exam: Filed Vitals:   10/29/15 0725 10/29/15 1104 10/29/15 1336 10/29/15 1616  BP: 133/93 110/68 124/74 120/65  Pulse: 95 80 82 80  Temp:      TempSrc:      Resp: 16 20 15 16   Height:      Weight:      SpO2: 93% 94% 95% 93%     General:  Adult female, anxious appearing, NAD  Eyes:  PERRL, anicteric, non-injected.  ENT:  Nares clear.  OP clear, non-erythematous without plaques or exudates.  MMM.  Neck:  Supple without TM or JVD.    Lymph:  No cervical, supraclavicular, or submandibular  LAD.  Cardiovascular:  RRR, normal S1, S2, without m/r/g.  2+ pulses, warm extremities  Respiratory:  CTA bilaterally without increased WOB.  Abdomen:  NABS.  Soft, ND, mild TTP in the RLQ without rebound or guarding  Skin:  No rashes or focal lesions.  Musculoskeletal:  Normal bulk and tone.  No LE edema.  Psychiatric:  A & O x 4.  Appropriate affect.  Neurologic:  CN 3-12 intact.  5/5 strength.  Sensation intact.  Labs on Admission:  Basic Metabolic Panel:  Recent Labs Lab 10/29/15 1015  NA 133*  K 3.9  CL 98*  CO2 23  GLUCOSE 140*  BUN 17  CREATININE 0.86  CALCIUM 9.3   Liver Function Tests:  Recent Labs Lab 10/29/15 1015  AST 35  ALT 29  ALKPHOS 75  BILITOT 1.1  PROT 8.0  ALBUMIN 4.0  Recent Labs Lab 10/29/15 1015  LIPASE 28   No results for input(s): AMMONIA in the last 168 hours. CBC:  Recent Labs Lab 10/29/15 1015  WBC 13.5*  NEUTROABS 11.4*  HGB 13.7  HCT 40.9  MCV 91.9  PLT 305   Cardiac Enzymes: No results for input(s): CKTOTAL, CKMB, CKMBINDEX, TROPONINI in the last 168 hours.  BNP (last 3 results) No results for input(s): BNP in the last 8760 hours.  ProBNP (last 3 results) No results for input(s): PROBNP in the last 8760 hours.  CBG: No results for input(s): GLUCAP in the last 168 hours.  Radiological Exams on Admission: Dg Abd 1 View  10/29/2015  CLINICAL DATA:  Lower abdominal pain and constipation for 1 month EXAM: ABDOMEN - 1 VIEW COMPARISON:  None. FINDINGS: There is moderate stool throughout colon. There is postoperative change in the right abdomen. There is no bowel dilatation or air-fluid level suggesting obstruction. No free air is seen on this supine examination. There are phleboliths in the pelvis. IMPRESSION: Bowel gas pattern unremarkable. No obstruction or free air. Moderate stool in colon. Electronically Signed   By: Lowella Grip III M.D.   On: 10/29/2015 07:15   Ct Abdomen Pelvis W Contrast  10/29/2015   CLINICAL DATA:  Right upper quadrant abdominal pain, constipation, prior appendectomy EXAM: CT ABDOMEN AND PELVIS WITH CONTRAST TECHNIQUE: Multidetector CT imaging of the abdomen and pelvis was performed using the standard protocol following bolus administration of intravenous contrast. CONTRAST:  12mL OMNIPAQUE IOHEXOL 300 MG/ML  SOLN COMPARISON:  None. FINDINGS: Lower chest:  Lung bases are clear. Cardiomegaly. Mitral valve annular calcifications. Pacemaker leads, incompletely visualized. Hepatobiliary: Liver is within normal limits. No suspicious/enhancing hepatic lesions. Gallbladder is unremarkable. No intrahepatic or extrahepatic ductal dilatation. Pancreas: Within normal limits. Spleen: Within normal limits. Adrenals/Urinary Tract: Adrenal glands are within normal limits. Kidneys within normal limits.  No hydronephrosis. Bladder is within normal limits. Stomach/Bowel: Stomach is within normal limits. No evidence of bowel obstruction. Prior appendectomy. Significant inflammatory changes involving a Donnice Nielsen segment of distal ileum with wall thickening, 1.7 x 2.0 x 3.4 cm intramural fluid and gas collection (series 2/image 54), and adjacent mesenteric inflammatory changes (series 2/image 50). Curvilinear hyperdensity adjacent to the collection may reflect a suture line from prior surgery (series 2/image 53), less likely a foreign body. No free air. Sigmoid diverticulosis, without evidence of diverticulitis. Vascular/Lymphatic: No evidence of abdominal aortic aneurysm. No suspicious abdominopelvic lymphadenopathy. Reproductive: Uterus is within normal limits. Bilateral ovaries are within normal limits. Other: No abdominopelvic ascites. Musculoskeletal: Degenerative changes of the visualized thoracolumbar spine. IMPRESSION: No evidence of bowel obstruction.  Prior appendectomy. Inflammatory changes involving the distal ileum with associated 1.7 x 2.0 x 3.4 cm intramural fluid and gas collection, possibly reflecting  a developing intramural abscess. Surrounding inflammatory changes. No free air. Differential considerations include infectious/inflammatory ileitis or possibly localized perforation secondary to foreign body or anastomotic breakdown. Electronically Signed   By: Julian Hy M.D.   On: 10/29/2015 12:42    EKG: pending  Assessment/Plan Principal Problem:   Intra-abdominal abscess post-lap appy July 2016 Active Problems:   Long term current use of anticoagulant   Anxiety disorder   Artificial cardiac pacemaker   H/O mitral valve replacement with mechanical valve   Appendicitis with perforation s/p lap appy 03/22/2015   Intra-abdominal abscess ([place])   Abscess of abdominal cavity ([place])  ---  Intra-adominal abscess post appendectomy in July 2016.  May be complication from previous surgery.  Less  likely, this reflects crohn's disease.  Plan to treat for abscess with antibiotics and if needed, drain placement.   -  CLD  -  Start zosyn and micafungin empirically -  Agree with florastor -  Appreciate general surgery assistance -  Will discuss with IR if area appears amenable to drainage given size and location  Anxiety disorder -  Agree with additional ativan   Mechanical mitral valve replacement -  Hold coumadin and allow INR to drift down.  Start heparin gtt once INR < 2.5  -  Repeat INR in AM  Paroxysmal atrial fibrillation and flutter s/p atrial ablation in 06/2015 at [facility], reportedly having fewer episodes of tachycardia when her PPM is interrogated.  CHADs2vasc = 2 (age and gender).   -  Telemetry -  Recently stopped amiodarone -  Continue metoprolol -  A/c as above  Hx of bradycardia s/p PPM insertion  Diet:  NPO with ice chips per general surgery Access:  PIV IVF:  yes Proph:  Warfarin > heparin gtt  Code Status: full  Family Communication: patient and her husband Disposition Plan: Admit to telemetry  Time spent: 60 min Janece Canterbury [facility] Pager  [phone]  If 7PM-7AM, please contact night-coverage [email or web address] Password [facility] 10/29/2015, 5:06 PM

## 2015-10-29 NOTE — Progress Notes (Signed)
Pharmacy Antibiotic Note  Mercedes Thompson is a 70 y.o. female admitted on 10/29/2015 with intra-abdominal infection.  Pharmacy has been consulted for Zosyn dosing.  Plan: Zosyn 3.375g IV q8h (4 hour infusion).  Height: 5\' 7"  (170.2 cm) Weight: 209 lb (94.802 kg) IBW/kg (Calculated) : 61.6  Temp (24hrs), Avg:98.3 F (36.8 C), Min:98.3 F (36.8 C), Max:98.3 F (36.8 C)   Recent Labs Lab 10/29/15 1015  WBC 13.5*  CREATININE 0.86    Estimated Creatinine Clearance: 73 mL/min (by C-G formula based on Cr of 0.86).    Allergies  Allergen Reactions  . Sulfa Antibiotics     AFFECTS COUMADIN  . Decongest-Aid [Pseudoephedrine] Palpitations    Antimicrobials this admission: zosyn 2/12 >>   Thank you for allowing pharmacy to be a part of this patient's care.  Nani Skillern Crowford 10/29/2015 4:28 PM

## 2015-10-29 NOTE — ED Notes (Signed)
MD at bedside. Dr. Gross

## 2015-10-30 LAB — BASIC METABOLIC PANEL
ANION GAP: 10 (ref 5–15)
BUN: 11 mg/dL (ref 6–20)
CHLORIDE: 106 mmol/L (ref 101–111)
CO2: 23 mmol/L (ref 22–32)
Calcium: 8.6 mg/dL — ABNORMAL LOW (ref 8.9–10.3)
Creatinine, Ser: 0.8 mg/dL (ref 0.44–1.00)
GFR calc Af Amer: >60 mL/min (ref >60)
Glucose, Bld: 99 mg/dL (ref 65–99)
POTASSIUM: 4.2 mmol/L (ref 3.5–5.1)
SODIUM: 139 mmol/L (ref 135–145)

## 2015-10-30 LAB — CBC
HCT: 37.6 % (ref 36.0–46.0)
Hemoglobin: 11.9 g/dL — ABNORMAL LOW (ref 12.0–15.0)
MCH: 30.5 pg (ref 26.0–34.0)
MCHC: 31.6 g/dL (ref 30.0–36.0)
MCV: 96.4 fL (ref 78.0–100.0)
PLATELETS: 261 10*3/uL (ref 150–400)
RBC: 3.9 MIL/uL (ref 3.87–5.11)
RDW: 13.6 % (ref 11.5–15.5)
WBC: 6.5 10*3/uL (ref 4.0–10.5)

## 2015-10-30 LAB — PROTIME-INR
INR: 2.84 — ABNORMAL HIGH (ref 0.00–1.49)
PROTHROMBIN TIME: 29.4 s — AB (ref 11.6–15.2)

## 2015-10-30 MED ORDER — FUROSEMIDE 20 MG PO TABS
20.0000 mg | ORAL_TABLET | Freq: Once | ORAL | Status: AC
  Administered 2015-10-30: 20 mg via ORAL
  Filled 2015-10-30: qty 1

## 2015-10-30 MED ORDER — WARFARIN SODIUM 5 MG PO TABS
10.0000 mg | ORAL_TABLET | Freq: Once | ORAL | Status: AC
  Administered 2015-10-30: 10 mg via ORAL
  Filled 2015-10-30: qty 2

## 2015-10-30 MED ORDER — WARFARIN - PHARMACIST DOSING INPATIENT: Freq: Every day | Status: DC

## 2015-10-30 NOTE — Progress Notes (Signed)
PHARMACY NOTE -  [facility] has been assisting with dosing of Zosyn for Intra-abdominal abscesses.  Dosage remains stable at 3.375 g IV q8 hr and need for further dosage adjustment appears unlikely at present.    Will sign off at this time.  Please reconsult if a change in clinical status warrants re-evaluation of dosage.  Reuel Boom, PharmD, BCPS Pager: [phone] 10/30/2015, 10:09 AM

## 2015-10-30 NOTE — Progress Notes (Signed)
Patient ID: Mercedes Thompson, female   DOB: 1946/08/04, 70 y.o.   MRN: [id number]  Progress Note: General Surgery Service   Subjective: Pain resolved. Tolerated diet, diarrhea overnight  Objective: Vital signs in last 24 hours: Temp:  [97.5 F (36.4 C)-98.4 F (36.9 C)] 97.5 F (36.4 C) (02/13 0529) Pulse Rate:  [70-83] 71 (02/13 0529) Resp:  [15-20] 16 (02/13 0529) BP: (110-135)/(65-80) 135/80 mmHg (02/13 0529) SpO2:  [93 %-98 %] 98 % (02/13 0529) Weight:  [94.983 kg (209 lb 6.4 oz)] 94.983 kg (209 lb 6.4 oz) (02/12 1714) Last BM Date: 10/29/15  Intake/Output from previous day: 02/12 0701 - 02/13 0700 In: 1355 [P.O.:240; I.V.:915; IV Piggyback:200] Out: -  Intake/Output this shift:    Lungs: CTAB  Cardiovascular: irreg irreg  Abd: soft, NT, ND  Extremities: no edma  Neuro: AOx4  Lab Results: CBC   Recent Labs  10/29/15 1015 10/30/15 0543  WBC 13.5* 6.5  HGB 13.7 11.9*  HCT 40.9 37.6  PLT 305 261   BMET  Recent Labs  10/29/15 1015 10/30/15 0543  NA 133* 139  K 3.9 4.2  CL 98* 106  CO2 23 23  GLUCOSE 140* 99  BUN 17 11  CREATININE 0.86 0.80  CALCIUM 9.3 8.6*   PT/INR  Recent Labs  10/29/15 1418 10/30/15 0543  LABPROT 29.9* 29.4*  INR 2.91* 2.84*   ABG No results for input(s): PHART, HCO3 in the last 72 hours.  Invalid input(s): PCO2, PO2  Studies/Results:  Anti-infectives: Anti-infectives    Start     Dose/Rate Route Frequency Ordered Stop   10/29/15 1700  micafungin (MYCAMINE) 100 mg in sodium chloride 0.9 % 100 mL IVPB    Comments:  Pharmacy may adjust dosing strength, schedule, rate of infusion, etc as needed to optimize therapy   100 mg 100 mL/hr over 1 Hours Intravenous Daily with supper 10/29/15 1611     10/29/15 1615  piperacillin-tazobactam (ZOSYN) IVPB 3.375 g     3.375 g 12.5 mL/hr over 240 Minutes Intravenous 3 times per day 10/29/15 1610     10/29/15 1315  cefTRIAXone (ROCEPHIN) 2 g in dextrose 5 % 50 mL IVPB     2 g 100  mL/hr over 30 Minutes Intravenous  Once 10/29/15 1300 10/29/15 1517   10/29/15 1315  metroNIDAZOLE (FLAGYL) IVPB 500 mg     500 mg 100 mL/hr over 60 Minutes Intravenous  Once 10/29/15 1300 10/29/15 2053      Medications: Scheduled Meds: . docusate sodium  100 mg Oral BID  . fluticasone  2 spray Each Nare Daily  . lip balm  1 application Topical BID  . LORazepam  1 mg Oral QHS  . metoprolol succinate  25 mg Oral QPM  . micafungin  100 mg Intravenous Q supper  . multivitamin with minerals  1 tablet Oral Daily  . piperacillin-tazobactam (ZOSYN)  IV  3.375 g Intravenous 3 times per day  . saccharomyces boulardii  250 mg Oral BID  . sodium chloride flush  3 mL Intravenous Q12H   Continuous Infusions: . lactated ringers 75 mL/hr at 10/30/15 0834   PRN Meds:.acetaminophen, acetaminophen, albuterol, alum & mag hydroxide-simeth, bisacodyl, diphenhydrAMINE, LORazepam, magic mouthwash, menthol-cetylpyridinium, metoprolol, ondansetron (ZOFRAN) IV **OR** ondansetron (ZOFRAN) IV, ondansetron, phenol, promethazine  Assessment/Plan: Patient Active Problem List   Diagnosis Date Noted  . Appendicitis with perforation s/p lap appy 03/22/2015 10/29/2015  . Intra-abdominal abscess post-lap appy July 2016 10/29/2015  . Intra-abdominal abscess ([place]) 10/29/2015  . Abscess  of abdominal cavity ([place]) 10/29/2015  . Artificial cardiac pacemaker 05/17/2015  . Sinoatrial node dysfunction ([facility]) 05/17/2015  . Long term current use of anticoagulant 03/22/2015  . Encounter for therapeutic drug level monitoring 03/07/2015  . Female genuine stress incontinence 03/25/2013  . Anxiety disorder 08/14/2011  . Dyslipidemia 08/13/2011  . Atrial fibrillation ([place]) 06/11/2011  . H/O mitral valve replacement with mechanical valve 06/11/2011   Intraabdominal abscess. Leukocytosis resolved -continue iv abx -advance diet -clinically improved, unlikely to need surgery or drainage procedure   LOS: 1 day   Mickeal Skinner, MD Pg# [phone] [facility]

## 2015-10-30 NOTE — Progress Notes (Signed)
[facility] PROGRESS NOTE  Mercedes Thompson [id number] DOB: 06/13/1946 DOA: 10/29/2015 PCP: Jonathon Bellows, MD  Brief Summary  The patient is a 70 y.o. year-old female with history of AML s/p chemotherapy in 1987, history of MV regurgitation s/p mechanical mitral valve replacement on chronic anticoagulation with warfarin, anxiety, atrial flutter and fibrillation s/p numerous direct cardioversions and finally s/p atrial ablation in 06/2015 at [facility] recently tapered off of amiodarone, bradycardia s/p PPM, and appendicitis with peritoneal abscess in 03/2015 which was treated at [facility] who presents with a month history of worsening constipation. She had some right lower quadrant pain that started approximately one week prior to admission that improved somewhat with heating pad. She attributed her pain to constipation and was seen by her PCP who recommended miralax. She had a slightly larger stool yesterday but because her pain was persistent, she came to the emergency department. One episode of emesis the night prior to admission, nonbilious, nonbloody. No blood or mucous in her stools. Denies fevers, chills. No family history of crohn's or colon cancer. Last colonoscopy was 2-4 years ago. She has had one polyp removed which was not precancerous.   In the ER, several enemas did not improve her pain so she underwent further testing. Her VSS, labs notable for mild leukocytosis to 13.5. CT demonstrated inflammatory changes involving the distal ileum with associated 1.7 x 2 x 3.4 cm intramural fluid and gas collection, possibly reflecting a developing intramural abscess with surrounding inflammatory changes. INR 2.91. Mild hyponatremia and hyperglycemia. She was started on ceftriaxone and flagyl by the ER MD and general surgery has been consulted and recommending conservative management with antibiotics.   Assessment/Plan  Intra-adominal abscess post appendectomy in July 2016. May be  complication from previous surgery. Less likely, this reflects crohn's disease. Plan to treat for abscess with antibiotics.  At this juncture, no need for drainage surgically or by IR.  Advance to FLD and continue zosyn and micafungin empirically.  Continue florastor.    Anxiety disorder, stable, continue prn ativan.  Mechanical mitral valve replacement, no plans for procedure, resume warfarin and repeat INR in AM  Paroxysmal atrial fibrillation and flutter s/p atrial ablation in 06/2015 at [facility], reportedly having fewer episodes of tachycardia when her PPM is interrogated. CHADs2vasc = 2 (age and gender).  - Telemetry:  NSR with occasionally paced beats, rare NSVT with 6 or few beats, no obvious a-fib - Recently stopped amiodarone  - Continue metoprolol  - A/c as above   Hx of bradycardia s/p PPM insertion   Leukocytosis, resolving with antibiotics  Diet:  Healthy heart Access: PIV  IVF: yes  Proph: Therapeutic warfarin  Code Status: full  Family Communication: patient and her husband  Disposition Plan:  Continue telemetry, plan for discharge on oral antibiotics tomorrow    Consultants:  General surgery   Procedures:  CT abd/pelvis  KUB  Antibiotics:  Zosyn 2/12 >   micafungin 2/12 >    HPI/Subjective:  Abdominal bloating and fullness have improved.  She feels much better.  Anxious about her probiotic and risk of C. Diff diarrhea.    Objective: Filed Vitals:   10/29/15 1616 10/29/15 1714 10/29/15 2206 10/30/15 0529  BP: 120/65 123/68 113/67 135/80  Pulse: 80 83 70 71  Temp:  98.4 F (36.9 C) 98.4 F (36.9 C) 97.5 F (36.4 C)  TempSrc:  Oral Oral Oral  Resp: 16 20 20 16   Height:  5\' 7"  (1.702 m)    Weight:  94.983 kg (209 lb 6.4 oz)    SpO2: 93% 98% 96% 98%    Intake/Output Summary (Last 24 hours) at 10/30/15 1415 Last data filed at 10/30/15 0918  Gross per 24 hour  Intake   1715 ml  Output      0 ml  Net   1715 ml   Filed Weights   10/29/15 0522  10/29/15 1714  Weight: 94.802 kg (209 lb) 94.983 kg (209 lb 6.4 oz)   Body mass index is 32.79 kg/(m^2).  Exam:   General:  Adult female, No acute distress  HEENT:  NCAT, MMM  Cardiovascular:  RRR, nl S1, S2 no mrg, 2+ pulses, warm extremities  Respiratory:  CTAB, no increased WOB  Abdomen:   NABS, soft, NT/ND  MSK:   Normal tone and bulk, no LEE  Neuro:  Grossly intact  Data Reviewed: Basic Metabolic Panel:  Recent Labs Lab 10/29/15 1015 10/30/15 0543  NA 133* 139  K 3.9 4.2  CL 98* 106  CO2 23 23  GLUCOSE 140* 99  BUN 17 11  CREATININE 0.86 0.80  CALCIUM 9.3 8.6*   Liver Function Tests:  Recent Labs Lab 10/29/15 1015  AST 35  ALT 29  ALKPHOS 75  BILITOT 1.1  PROT 8.0  ALBUMIN 4.0    Recent Labs Lab 10/29/15 1015  LIPASE 28   No results for input(s): AMMONIA in the last 168 hours. CBC:  Recent Labs Lab 10/29/15 1015 10/30/15 0543  WBC 13.5* 6.5  NEUTROABS 11.4*  --   HGB 13.7 11.9*  HCT 40.9 37.6  MCV 91.9 96.4  PLT 305 261    No results found for this or any previous visit (from the past 240 hour(s)).   Studies: Dg Abd 1 View  10/29/2015  CLINICAL DATA:  Lower abdominal pain and constipation for 1 month EXAM: ABDOMEN - 1 VIEW COMPARISON:  None. FINDINGS: There is moderate stool throughout colon. There is postoperative change in the right abdomen. There is no bowel dilatation or air-fluid level suggesting obstruction. No free air is seen on this supine examination. There are phleboliths in the pelvis. IMPRESSION: Bowel gas pattern unremarkable. No obstruction or free air. Moderate stool in colon. Electronically Signed   By: Lowella Grip III M.D.   On: 10/29/2015 07:15   Ct Abdomen Pelvis W Contrast  10/29/2015  CLINICAL DATA:  Right upper quadrant abdominal pain, constipation, prior appendectomy EXAM: CT ABDOMEN AND PELVIS WITH CONTRAST TECHNIQUE: Multidetector CT imaging of the abdomen and pelvis was performed using the standard  protocol following bolus administration of intravenous contrast. CONTRAST:  146mL OMNIPAQUE IOHEXOL 300 MG/ML  SOLN COMPARISON:  None. FINDINGS: Lower chest:  Lung bases are clear. Cardiomegaly. Mitral valve annular calcifications. Pacemaker leads, incompletely visualized. Hepatobiliary: Liver is within normal limits. No suspicious/enhancing hepatic lesions. Gallbladder is unremarkable. No intrahepatic or extrahepatic ductal dilatation. Pancreas: Within normal limits. Spleen: Within normal limits. Adrenals/Urinary Tract: Adrenal glands are within normal limits. Kidneys within normal limits.  No hydronephrosis. Bladder is within normal limits. Stomach/Bowel: Stomach is within normal limits. No evidence of bowel obstruction. Prior appendectomy. Significant inflammatory changes involving a [place] segment of distal ileum with wall thickening, 1.7 x 2.0 x 3.4 cm intramural fluid and gas collection (series 2/image 54), and adjacent mesenteric inflammatory changes (series 2/image 50). Curvilinear hyperdensity adjacent to the collection may reflect a suture line from prior surgery (series 2/image 53), less likely a foreign body. No free air. Sigmoid diverticulosis, without evidence of diverticulitis. Vascular/Lymphatic:  No evidence of abdominal aortic aneurysm. No suspicious abdominopelvic lymphadenopathy. Reproductive: Uterus is within normal limits. Bilateral ovaries are within normal limits. Other: No abdominopelvic ascites. Musculoskeletal: Degenerative changes of the visualized thoracolumbar spine. IMPRESSION: No evidence of bowel obstruction.  Prior appendectomy. Inflammatory changes involving the distal ileum with associated 1.7 x 2.0 x 3.4 cm intramural fluid and gas collection, possibly reflecting a developing intramural abscess. Surrounding inflammatory changes. No free air. Differential considerations include infectious/inflammatory ileitis or possibly localized perforation secondary to foreign body or anastomotic  breakdown. Electronically Signed   By: Julian Hy M.D.   On: 10/29/2015 12:42    Scheduled Meds: . docusate sodium  100 mg Oral BID  . fluticasone  2 spray Each Nare Daily  . lip balm  1 application Topical BID  . LORazepam  1 mg Oral QHS  . metoprolol succinate  25 mg Oral QPM  . micafungin  100 mg Intravenous Q supper  . multivitamin with minerals  1 tablet Oral Daily  . piperacillin-tazobactam (ZOSYN)  IV  3.375 g Intravenous 3 times per day  . saccharomyces boulardii  250 mg Oral BID  . sodium chloride flush  3 mL Intravenous Q12H  . Warfarin - Pharmacist Dosing Inpatient   Does not apply q1800   Continuous Infusions: . lactated ringers 75 mL/hr at 10/30/15 [id number]    Principal Problem:   Intra-abdominal abscess post-lap appy July 2016 Active Problems:   Long term current use of anticoagulant   Anxiety disorder   Artificial cardiac pacemaker   H/O mitral valve replacement with mechanical valve   Appendicitis with perforation s/p lap appy 03/22/2015   Intra-abdominal abscess ([place])   Abscess of abdominal cavity ([place])    Time spent: 30 min    Shital Crayton, [facility] Pager [phone]. If 7PM-7AM, please contact night-coverage at [email or web address], password [facility] 10/30/2015, 2:15 PM  LOS: 1 day

## 2015-10-30 NOTE — Progress Notes (Signed)
[facility] for warfarin Indication: mechanical mitral valve  Allergies  Allergen Reactions  . Sulfa Antibiotics     AFFECTS COUMADIN  . Decongest-Aid [Pseudoephedrine] Palpitations    Patient Measurements: Height: 5\' 7"  (170.2 cm) Weight: 209 lb 6.4 oz (94.983 kg) IBW/kg (Calculated) : 61.6  Vital Signs: Temp: 97.5 F (36.4 C) (02/13 0529) Temp Source: Oral (02/13 0529) BP: 135/80 mmHg (02/13 0529) Pulse Rate: 71 (02/13 0529)  Labs:  Recent Labs  10/29/15 1015 10/29/15 1418 10/30/15 0543  HGB 13.7  --  11.9*  HCT 40.9  --  37.6  PLT 305  --  261  LABPROT  --  29.9* 29.4*  INR  --  2.91* 2.84*  CREATININE 0.86  --  0.80    Estimated Creatinine Clearance: 78.6 mL/min (by C-G formula based on Cr of 0.8).   Medical History: Past Medical History  Diagnosis Date  . Anxiety   . AML (acute myeloblastic leukemia) ([place]) 1987  . H/O mitral valve replacement with mechanical valve     coumadin  . Bradycardia 2015    s/p PPM insertion  . Atrial flutter ([place]) 2016    s/p cardioversion 02/2015, previously in [place], recently taken off amiodarone  . Appendicitis with peritoneal abscess 03/2015    s/p ex lap at [facility]    Assessment: 57 yoF on chronic warfarin for mechanical mitral valve replacement. Cardiac history also significant for atrial flutter and fibrillation s/p numerous direct cardioversions and finally s/p atrial ablation in 06/2015 at [facility] recently tapered off of amiodarone, bradycardia s/p PPM.  Patient admitted with intra-abdominal abscess post appendectomy in July 2016.  Warfarin held on admission for possible surgical procedure, but per Surgery patient improved today and procedure not likely necessary.  To resume warfarin per pharmacy   Assessment:   Baseline INR: therapeutic  Prior anticoagulation: warfarin 10 mg daily except 7.5 mg on Thurs, Sat, Sun   Today, 10/30/2015:  CBC: Hgb/Plt dropped overnight (may  be partially dilutional); both still w/in reasonable limits  INR therapeutic  Major drug interactions: none, Flagyl x 1 yesterday  No bleeding issues documented  Heart diet ordered, intake not recorded  Goal of Therapy: INR 2.5-3.5  Plan:  Warfarin 10 mg PO tonight at 18:00  Daily INR  CBC at least q72 hr while on warfarin  Monitor for signs of bleeding or thrombosis   Reuel Boom, PharmD Pager: [phone] 10/30/2015, 9:52 AM

## 2015-10-31 DIAGNOSIS — K352 Acute appendicitis with generalized peritonitis: Secondary | ICD-10-CM

## 2015-10-31 DIAGNOSIS — T814XXD Infection following a procedure, subsequent encounter: Secondary | ICD-10-CM

## 2015-10-31 LAB — PROTIME-INR
INR: 3.02 — AB (ref 0.00–1.49)
PROTHROMBIN TIME: 30.8 s — AB (ref 11.6–15.2)

## 2015-10-31 MED ORDER — SODIUM CHLORIDE 0.9 % IV SOLN: 100.0000 mg | INTRAVENOUS | Status: DC

## 2015-10-31 MED ORDER — AMOXICILLIN-POT CLAVULANATE 875-125 MG PO TABS: 1.0000 | ORAL_TABLET | Freq: Two times a day (BID) | ORAL | Status: DC

## 2015-10-31 MED ORDER — SACCHAROMYCES BOULARDII 250 MG PO CAPS: 250.0000 mg | ORAL_CAPSULE | Freq: Two times a day (BID) | ORAL | Status: DC

## 2015-10-31 MED ORDER — WARFARIN SODIUM 5 MG PO TABS: 10.0000 mg | ORAL_TABLET | Freq: Once | ORAL | Status: DC

## 2015-10-31 NOTE — Progress Notes (Signed)
Patient ID: Mercedes Thompson, female   DOB: May 10, 1946, 70 y.o.   MRN: [id number]     [facility]      [address] [place], [place] [address]    Phone: [phone] FAX: [phone]     Subjective: Had a BM.  Tolerating POs.  No pain. Afebrile.   Objective:  Vital signs:  Filed Vitals:   10/30/15 0529 10/30/15 1430 10/30/15 2055 10/31/15 0629  BP: 135/80 118/57 149/78 120/58  Pulse: 71 74 76 72  Temp: 97.5 F (36.4 C) 97.7 F (36.5 C) 98.6 F (37 C) 98.2 F (36.8 C)  TempSrc: Oral Oral Oral Oral  Resp: '16 17 18 18  '$ Height:      Weight:    94.892 kg (209 lb 3.2 oz)  SpO2: 98% 98% 98% 98%    Last BM Date: 10/29/15  Intake/Output   Yesterday:  02/13 0701 - 02/14 0700 In: 2750 [P.O.:700; I.V.:1800; IV Piggyback:250] Out: -  This shift:    Physical Exam: General: Pt awake/alert/oriented x4 in no acute distress Abdomen: Soft.  Nondistended.  Non tender.  No evidence of peritonitis.  No incarcerated hernias.    Problem List:   Principal Problem:   Intra-abdominal abscess post-lap appy July 2016 Active Problems:   Long term current use of anticoagulant   Anxiety disorder   Artificial cardiac pacemaker   H/O mitral valve replacement with mechanical valve   Appendicitis with perforation s/p lap appy 03/22/2015   Intra-abdominal abscess (HCC)   Abscess of abdominal cavity (HCC)    Results:   Labs: Results for orders placed or performed during the hospital encounter of 10/29/15 (from the past 48 hour(s))  CBC with Differential     Status: Abnormal   Collection Time: 10/29/15 10:15 AM  Result Value Ref Range   WBC 13.5 (H) 4.0 - 10.5 K/uL   RBC 4.45 3.87 - 5.11 MIL/uL   Hemoglobin 13.7 12.0 - 15.0 g/dL   HCT 40.9 36.0 - 46.0 %   MCV 91.9 78.0 - 100.0 fL   MCH 30.8 26.0 - 34.0 pg   MCHC 33.5 30.0 - 36.0 g/dL   RDW 13.3 11.5 - 15.5 %   Platelets 305 150 - 400 K/uL   Neutrophils Relative % 85 %   Neutro Abs 11.4  (H) 1.7 - 7.7 K/uL   Lymphocytes Relative 6 %   Lymphs Abs 0.9 0.7 - 4.0 K/uL   Monocytes Relative 9 %   Monocytes Absolute 1.2 (H) 0.1 - 1.0 K/uL   Eosinophils Relative 0 %   Eosinophils Absolute 0.0 0.0 - 0.7 K/uL   Basophils Relative 0 %   Basophils Absolute 0.0 0.0 - 0.1 K/uL  Comprehensive metabolic panel     Status: Abnormal   Collection Time: 10/29/15 10:15 AM  Result Value Ref Range   Sodium 133 (L) 135 - 145 mmol/L   Potassium 3.9 3.5 - 5.1 mmol/L   Chloride 98 (L) 101 - 111 mmol/L   CO2 23 22 - 32 mmol/L   Glucose, Bld 140 (H) 65 - 99 mg/dL   BUN 17 6 - 20 mg/dL   Creatinine, Ser 0.86 0.44 - 1.00 mg/dL   Calcium 9.3 8.9 - 10.3 mg/dL   Total Protein 8.0 6.5 - 8.1 g/dL   Albumin 4.0 3.5 - 5.0 g/dL   AST 35 15 - 41 U/L   ALT 29 14 - 54 U/L   Alkaline Phosphatase 75 38 - 126 U/L  Total Bilirubin 1.1 0.3 - 1.2 mg/dL   GFR calc non Af Amer >60 >60 mL/min   GFR calc Af Amer >60 >60 mL/min    Comment: (NOTE) The eGFR has been calculated using the CKD EPI equation. This calculation has not been validated in all clinical situations. eGFR's persistently <60 mL/min signify possible Chronic Kidney Disease.    Anion gap 12 5 - 15  Lipase, blood     Status: None   Collection Time: 10/29/15 10:15 AM  Result Value Ref Range   Lipase 28 11 - 51 U/L  Urinalysis, Routine w reflex microscopic (not at [facility])     Status: Abnormal   Collection Time: 10/29/15 11:00 AM  Result Value Ref Range   Color, Urine YELLOW YELLOW   APPearance CLEAR CLEAR   Specific Gravity, Urine 1.007 1.005 - 1.030   pH 6.0 5.0 - 8.0   Glucose, UA NEGATIVE NEGATIVE mg/dL   Hgb urine dipstick TRACE (A) NEGATIVE   Bilirubin Urine NEGATIVE NEGATIVE   Ketones, ur NEGATIVE NEGATIVE mg/dL   Protein, ur NEGATIVE NEGATIVE mg/dL   Nitrite NEGATIVE NEGATIVE   Leukocytes, UA NEGATIVE NEGATIVE  Urine microscopic-add on     Status: Abnormal   Collection Time: 10/29/15 11:00 AM  Result Value Ref Range   Squamous  Epithelial / LPF 0-5 (A) NONE SEEN   WBC, UA NONE SEEN 0 - 5 WBC/hpf   RBC / HPF 0-5 0 - 5 RBC/hpf   Bacteria, UA RARE (A) NONE SEEN  Protime-INR     Status: Abnormal   Collection Time: 10/29/15  2:18 PM  Result Value Ref Range   Prothrombin Time 29.9 (H) 11.6 - 15.2 seconds   INR 2.91 (H) 0.00 - 1.49  CBC     Status: Abnormal   Collection Time: 10/30/15  5:43 AM  Result Value Ref Range   WBC 6.5 4.0 - 10.5 K/uL   RBC 3.90 3.87 - 5.11 MIL/uL   Hemoglobin 11.9 (L) 12.0 - 15.0 g/dL   HCT 37.6 36.0 - 46.0 %   MCV 96.4 78.0 - 100.0 fL   MCH 30.5 26.0 - 34.0 pg   MCHC 31.6 30.0 - 36.0 g/dL   RDW 13.6 11.5 - 15.5 %   Platelets 261 150 - 400 K/uL  Protime-INR     Status: Abnormal   Collection Time: 10/30/15  5:43 AM  Result Value Ref Range   Prothrombin Time 29.4 (H) 11.6 - 15.2 seconds   INR 2.84 (H) 0.00 - 5.32  Basic metabolic panel     Status: Abnormal   Collection Time: 10/30/15  5:43 AM  Result Value Ref Range   Sodium 139 135 - 145 mmol/L   Potassium 4.2 3.5 - 5.1 mmol/L   Chloride 106 101 - 111 mmol/L   CO2 23 22 - 32 mmol/L   Glucose, Bld 99 65 - 99 mg/dL   BUN 11 6 - 20 mg/dL   Creatinine, Ser 0.80 0.44 - 1.00 mg/dL   Calcium 8.6 (L) 8.9 - 10.3 mg/dL   GFR calc non Af Amer >60 >60 mL/min   GFR calc Af Amer >60 >60 mL/min    Comment: (NOTE) The eGFR has been calculated using the CKD EPI equation. This calculation has not been validated in all clinical situations. eGFR's persistently <60 mL/min signify possible Chronic Kidney Disease.    Anion gap 10 5 - 15  Protime-INR     Status: Abnormal   Collection Time: 10/31/15  5:19 AM  Result Value Ref Range   Prothrombin Time 30.8 (H) 11.6 - 15.2 seconds   INR 3.02 (H) 0.00 - 1.49    Imaging / Studies: Ct Abdomen Pelvis W Contrast  10/29/2015  CLINICAL DATA:  Right upper quadrant abdominal pain, constipation, prior appendectomy EXAM: CT ABDOMEN AND PELVIS WITH CONTRAST TECHNIQUE: Multidetector CT imaging of the  abdomen and pelvis was performed using the standard protocol following bolus administration of intravenous contrast. CONTRAST:  OMNIPAQUE IOHEXOL 300 MG/ML  SOLN COMPARISON:  None. FINDINGS: Lower chest:  Lung bases are clear. Cardiomegaly. Mitral valve annular calcifications. Pacemaker leads, incompletely visualized. Hepatobiliary: Liver is within normal limits. No suspicious/enhancing hepatic lesions. Gallbladder is unremarkable. No intrahepatic or extrahepatic ductal dilatation. Pancreas: Within normal limits. Spleen: Within normal limits. Adrenals/Urinary Tract: Adrenal glands are within normal limits. Kidneys within normal limits.  No hydronephrosis. Bladder is within normal limits. Stomach/Bowel: Stomach is within normal limits. No evidence of bowel obstruction. Prior appendectomy. Significant inflammatory changes involving a short segment of distal ileum with wall thickening, 1.7 x 2.0 x 3.4 cm intramural fluid and gas collection (series 2/image 54), and adjacent mesenteric inflammatory changes (series 2/image 50). Curvilinear hyperdensity adjacent to the collection may reflect a suture line from prior surgery (series 2/image 53), less likely a foreign body. No free air. Sigmoid diverticulosis, without evidence of diverticulitis. Vascular/Lymphatic: No evidence of abdominal aortic aneurysm. No suspicious abdominopelvic lymphadenopathy. Reproductive: Uterus is within normal limits. Bilateral ovaries are within normal limits. Other: No abdominopelvic ascites. Musculoskeletal: Degenerative changes of the visualized thoracolumbar spine. IMPRESSION: No evidence of bowel obstruction.  Prior appendectomy. Inflammatory changes involving the distal ileum with associated 1.7 x 2.0 x 3.4 cm intramural fluid and gas collection, possibly reflecting a developing intramural abscess. Surrounding inflammatory changes. No free air. Differential considerations include infectious/inflammatory ileitis or possibly localized  perforation secondary to foreign body or anastomotic breakdown. Electronically Signed   By: Charline Bills M.D.   On: 10/29/2015 12:42    Medications / Allergies:  Scheduled Meds: . anidulafungin  100 mg Intravenous Q24H  . docusate sodium  100 mg Oral BID  . fluticasone  2 spray Each Nare Daily  . lip balm  1 application Topical BID  . LORazepam  1 mg Oral QHS  . metoprolol succinate  25 mg Oral QPM  . multivitamin with minerals  1 tablet Oral Daily  . piperacillin-tazobactam (ZOSYN)  IV  3.375 g Intravenous 3 times per day  . saccharomyces boulardii  250 mg Oral BID  . sodium chloride flush  3 mL Intravenous Q12H  . Warfarin - Pharmacist Dosing Inpatient   Does not apply q1800   Continuous Infusions: . lactated ringers 75 mL/hr at 10/30/15 0834   PRN Meds:.acetaminophen, acetaminophen, albuterol, alum & mag hydroxide-simeth, bisacodyl, diphenhydrAMINE, LORazepam, magic mouthwash, menthol-cetylpyridinium, metoprolol, ondansetron (ZOFRAN) IV **OR** ondansetron (ZOFRAN) IV, ondansetron, phenol, promethazine  Antibiotics: Anti-infectives    Start     Dose/Rate Route Frequency Ordered Stop   10/31/15 1800  anidulafungin (ERAXIS) 100 mg in sodium chloride 0.9 % 100 mL IVPB     100 mg over 90 Minutes Intravenous Every 24 hours 10/31/15 0747     10/29/15 1700  micafungin (MYCAMINE) 100 mg in sodium chloride 0.9 % 100 mL IVPB  Status:  Discontinued    Comments:  Pharmacy may adjust dosing strength, schedule, rate of infusion, etc as needed to optimize therapy   100 mg 100 mL/hr over 1 Hours Intravenous Daily with supper 10/29/15 1611 10/31/15 0747  10/29/15 1615  piperacillin-tazobactam (ZOSYN) IVPB 3.375 g     3.375 g 12.5 mL/hr over 240 Minutes Intravenous 3 times per day 10/29/15 1610     10/29/15 1315  cefTRIAXone (ROCEPHIN) 2 g in dextrose 5 % 50 mL IVPB     2 g 100 mL/hr over 30 Minutes Intravenous  Once 10/29/15 1300 10/29/15 1517   10/29/15 1315  metroNIDAZOLE (FLAGYL) IVPB  500 mg     500 mg 100 mL/hr over 60 Minutes Intravenous  Once 10/29/15 1300 10/29/15 2053        Assessment/Plan S/p appendectomy for perforated appendicitis 03/22/15 at [facility] Intra-abdominal abscess-clinically improved. Ok to change to Augmentin for total of 14 days of atbx.  Agree with florastor.  Will schedule follow up in our office 2-3 weeks.   Patient is requesting discharge summary be sent to her PCP, Dr. Genella Rife, ANP-BC [facility]   10/31/2015 9:56 AM

## 2015-10-31 NOTE — Progress Notes (Signed)
[place] for warfarin Indication: mechanical mitral valve  Allergies  Allergen Reactions  . Sulfa Antibiotics     AFFECTS COUMADIN  . Decongest-Aid [Pseudoephedrine] Palpitations    Patient Measurements: Height: 5\' 7"  (170.2 cm) Weight: 209 lb 3.2 oz (94.892 kg) IBW/kg (Calculated) : 61.6  Vital Signs: Temp: 98.2 F (36.8 C) (02/14 0629) Temp Source: Oral (02/14 0629) BP: 120/58 mmHg (02/14 0629) Pulse Rate: 72 (02/14 0629)  Labs:  Recent Labs  10/29/15 1015 10/29/15 1418 10/30/15 0543 10/31/15 0519  HGB 13.7  --  11.9*  --   HCT 40.9  --  37.6  --   PLT 305  --  261  --   LABPROT  --  29.9* 29.4* 30.8*  INR  --  2.91* 2.84* 3.02*  CREATININE 0.86  --  0.80  --     Estimated Creatinine Clearance: 78.5 mL/min (by C-G formula based on Cr of 0.8).   Medical History: Past Medical History  Diagnosis Date  . Anxiety   . AML (acute myeloblastic leukemia) ([place]) 1987  . H/O mitral valve replacement with mechanical valve     coumadin  . Bradycardia 2015    s/p PPM insertion  . Atrial flutter ([place]) 2016    s/p cardioversion 02/2015, previously in [place], recently taken off amiodarone  . Appendicitis with peritoneal abscess 03/2015    s/p ex lap at [facility]    Assessment: 5 yoF on chronic warfarin for mechanical mitral valve replacement. Cardiac history also significant for atrial flutter and fibrillation s/p numerous direct cardioversions and finally s/p atrial ablation in 06/2015 at [facility] recently tapered off of amiodarone, bradycardia s/p PPM.  Patient admitted with intra-abdominal abscess post appendectomy in July 2016.  Warfarin held on admission for possible surgical procedure, but per Surgery patient improved today and procedure not likely necessary.  To resume warfarin per pharmacy.  Assessment:  Baseline INR: therapeutic  Prior anticoagulation: warfarin 10 mg daily except 7.5 mg on Thurs, Sat, Sun  Today,  10/31/2015:  CBC (2/13): Hgb/Plt dropped overnight (may be partially dilutional); both still w/in reasonable limits  INR therapeutic  Major drug interactions: none, Flagyl x 1 yesterday  No bleeding issues documented  Heart diet ordered, eating 85% of meals  Goal of Therapy: INR 2.5-3.5  Plan:  Warfarin 10 mg PO x 1 again tonight at 18:00.  Would have expected INR to continue to drop after holding doses.  If INR rising significantly tomorrow, would reduce dose.  Daily INR  CBC at least q72 hr while on warfarin  Monitor for signs of bleeding or thrombosis   Reuel Boom, PharmD Pager: [phone] 10/31/2015, 12:24 PM

## 2015-10-31 NOTE — Discharge Summary (Signed)
Physician Discharge Summary  Mercedes Thompson [id number] DOB: [birth year] DOA: 10/29/2015  PCP: Jonathon Bellows, MD  Admit date: 10/29/2015 Discharge date: 10/31/2015  Recommendations for Outpatient Follow-up:  1. Continue Augmentin with probiotic for two weeks 2. Follow-up with general surgery in 2-3 weeks for possible reimaging 3. PCP in 1 week or sooner as needed.   4. INR check tomorrow  Discharge Diagnoses:  Principal Problem:   Intra-abdominal abscess post-lap appy July 2016 Active Problems:   Long term current use of anticoagulant   Anxiety disorder   Artificial cardiac pacemaker   H/O mitral valve replacement with mechanical valve   Appendicitis with perforation s/p lap appy 03/22/2015   Intra-abdominal abscess ([place])   Abscess of abdominal cavity ([place])   Discharge Condition: Stable, improved  Diet recommendation: Healthy heart  Wt Readings from Last 3 Encounters:  10/31/15 94.892 kg (209 lb 3.2 oz)  03/23/14 101.606 kg (224 lb)  02/25/14 101.606 kg (224 lb)    History of present illness:   The patient is a 70 year old female with history of AML status post chemotherapy in 1987, history of mitral valve regurgitation status post mechanical mitral valve replacement who is on chronic therapeutic warfarin, anxiety, atrial flutter and atrial fibrillation status post numerous direct cardioversions and finely status post atrial ablation in October 2016 at [facility]. She was recently tapered off her amiodarone. She also has history of bradycardia status post pacemaker placement. She underwent a laparoscopic appendectomy at [place] for appendicitis with associated peritoneal abscess in July 2016 and was treated with postprocedure antibiotics for lingering infection. She recovered from this illness however over the last month she has had constipation and family over the last week prior to admission she had worsening bloating, pain in the right lower quadrant, one episode of  emesis, but no fevers or chills. She was found to have inflammation of the distal ileum with a 1.7 x 2 x 3.4 cm intramural fluid and gas collection concerning for intramural abscess. She was started on antibiotics and admitted for further management. She was seen by general surgery in the emergency department to determine she did not need urgent surgery.  Hospital Course:   Intra-adominal abscess post appendectomy in July 2016. May be complication from previous surgery. Less likely, this reflects crohn's disease. Plan to treat for abscess with antibiotics. At this juncture, no need for drainage surgically or by IR due to the size and location. She was started on zosyn and micafungin empirically and quickly had improvement in her abdominal pain. Her stools became more formed and her leukocytosis resolved. She should continue antibiotics with Augmentin for a total of 2 weeks and follow-up with general surgery in approximately 2-3 weeks for re-evaluation. She was given florastor to reduce her risk of C. difficile diarrhea. She was advised that any time between now in 3 months for now should she develop severe diarrhea she should seek immediate medical attention.   Anxiety disorder, anxious on exam, continued prn ativan.  Mechanical mitral valve replacement, she had 1 dose of her warfarin held in case she had worsening of her symptoms and required surgery, however she had improvement with antibiotics and her warfarin was resumed the following day. On the date of discharge her INR is 3.  She was advised to continue her home warfarin dose and to follow-up at her already scheduled INR appointment tomorrow.  Paroxysmal atrial fibrillation and flutter s/p atrial ablation in 06/2015 at [facility], reportedly having fewer episodes of tachycardia when her  PPM is interrogated. CHADs2vasc = 2 (age and gender). Telemetry demonstrated sinus rhythm with occasional paced beats, rare episodes of nonsustained V. tach with 6  are fewer beats. She had no obvious atrial fibrillation. Her amiodarone was recently discontinued by her cardiologist and there was no indication to resume this medication during hospitalization. She continued her metoprolol and she was therapeutic on her warfarin at the time of discharge.  Hx of bradycardia s/p PPM insertion   Leukocytosis, resolved with antibiotics   Consultants:  General surgery  Procedures:  CT abd/pelvis  KUB  Antibiotics:  Zosyn 2/12 >   micafungin 2/12 >  Discharge Exam: Filed Vitals:   10/30/15 2055 10/31/15 0629  BP: 149/78 120/58  Pulse: 76 72  Temp: 98.6 F (37 C) 98.2 F (36.8 C)  Resp: 18 18   Filed Vitals:   10/30/15 0529 10/30/15 1430 10/30/15 2055 10/31/15 0629  BP: 135/80 118/57 149/78 120/58  Pulse: 71 74 76 72  Temp: 97.5 F (36.4 C) 97.7 F (36.5 C) 98.6 F (37 C) 98.2 F (36.8 C)  TempSrc: Oral Oral Oral Oral  Resp: 16 17 18 18   Height:      Weight:    94.892 kg (209 lb 3.2 oz)  SpO2: 98% 98% 98% 98%     General: Adult female, No acute distress  HEENT: NCAT, MMM  Cardiovascular: RRR, mechanical click prominent over apex  Respiratory: CTAB, no increased WOB  Abdomen: NABS, soft, NT/ND  MSK: Normal tone and bulk, no LEE  Neuro: Grossly intact  Discharge Instructions      Discharge Instructions    Call MD for:  difficulty breathing, headache or visual disturbances    Complete by:  As directed      Call MD for:  extreme fatigue    Complete by:  As directed      Call MD for:  hives    Complete by:  As directed      Call MD for:  persistant dizziness or light-headedness    Complete by:  As directed      Call MD for:  persistant nausea and vomiting    Complete by:  As directed      Call MD for:  redness, tenderness, or signs of infection (pain, swelling, redness, odor or green/yellow discharge around incision site)    Complete by:  As directed      Call MD for:  severe uncontrolled pain     Complete by:  As directed      Call MD for:  temperature >100.4    Complete by:  As directed      Diet - low sodium heart healthy    Complete by:  As directed      Increase activity slowly    Complete by:  As directed             Medication List    STOP taking these medications        PROBIOTIC ADVANCED PO      TAKE these medications        albuterol 108 (90 Base) MCG/ACT inhaler  Commonly known as:  PROVENTIL HFA;VENTOLIN HFA  Inhale 2 puffs into the lungs every 6 (six) hours as needed for wheezing or shortness of breath.     amoxicillin-clavulanate 875-125 MG tablet  Commonly known as:  AUGMENTIN  Take 1 tablet by mouth 2 (two) times daily.     calcium-vitamin D 500-200 MG-UNIT tablet  Commonly known as:  OSCAL  WITH D  Take 1 tablet by mouth daily with breakfast.     cholecalciferol 1000 units tablet  Commonly known as:  VITAMIN D  Take 1,000 Units by mouth daily.     fluticasone 50 MCG/ACT nasal spray  Commonly known as:  FLONASE  Place 2 sprays into the nose daily.     furosemide 20 MG tablet  Commonly known as:  LASIX  Take 20 mg by mouth daily as needed for fluid (for fluid retention over 2 lbs).     LORazepam 1 MG tablet  Commonly known as:  ATIVAN  Take 1 mg by mouth at bedtime.     metoprolol succinate 25 MG 24 hr tablet  Commonly known as:  TOPROL-XL  Take 25 mg by mouth every evening.     multivitamin tablet  Take 1 tablet by mouth daily.     saccharomyces boulardii 250 MG capsule  Commonly known as:  FLORASTOR  Take 1 capsule (250 mg total) by mouth 2 (two) times daily.     warfarin 5 MG tablet  Commonly known as:  COUMADIN  Take 7.5-10 mg by mouth every morning. Take 1 and one-half tablets (7.5 mg) on Thursdays, Saturdays and Sundays. Take 2 tablets (10 mg) on all other days       Follow-up Information    Follow up with Adin Hector., MD On 11/20/2015.   Specialty:  General Surgery   Why:  arrive by 10:30AM for a 11AM check up with  your surgeon.    Contact information:   [address] [place] [address] [phone]       Follow up with WEBB, Arbie Cookey D, MD. Schedule an appointment as soon as possible for a visit in 1 week.   Specialty:  Family Medicine   Contact information:   [place] [address] [phone]        The results of significant diagnostics from this hospitalization (including imaging, microbiology, ancillary and laboratory) are listed below for reference.    Significant Diagnostic Studies: Dg Abd 1 View  10/29/2015  CLINICAL DATA:  Lower abdominal pain and constipation for 1 month EXAM: ABDOMEN - 1 VIEW COMPARISON:  None. FINDINGS: There is moderate stool throughout colon. There is postoperative change in the right abdomen. There is no bowel dilatation or air-fluid level suggesting obstruction. No free air is seen on this supine examination. There are phleboliths in the pelvis. IMPRESSION: Bowel gas pattern unremarkable. No obstruction or free air. Moderate stool in colon. Electronically Signed   By: Lowella Grip III M.D.   On: 10/29/2015 07:15   Ct Abdomen Pelvis W Contrast  10/29/2015  CLINICAL DATA:  Right upper quadrant abdominal pain, constipation, prior appendectomy EXAM: CT ABDOMEN AND PELVIS WITH CONTRAST TECHNIQUE: Multidetector CT imaging of the abdomen and pelvis was performed using the standard protocol following bolus administration of intravenous contrast. CONTRAST:  18mL OMNIPAQUE IOHEXOL 300 MG/ML  SOLN COMPARISON:  None. FINDINGS: Lower chest:  Lung bases are clear. Cardiomegaly. Mitral valve annular calcifications. Pacemaker leads, incompletely visualized. Hepatobiliary: Liver is within normal limits. No suspicious/enhancing hepatic lesions. Gallbladder is unremarkable. No intrahepatic or extrahepatic ductal dilatation. Pancreas: Within normal limits. Spleen: Within normal limits. Adrenals/Urinary Tract: Adrenal glands are within  normal limits. Kidneys within normal limits.  No hydronephrosis. Bladder is within normal limits. Stomach/Bowel: Stomach is within normal limits. No evidence of bowel obstruction. Prior appendectomy. Significant inflammatory changes involving a Marlen Koman segment of distal ileum  with wall thickening, 1.7 x 2.0 x 3.4 cm intramural fluid and gas collection (series 2/image 54), and adjacent mesenteric inflammatory changes (series 2/image 50). Curvilinear hyperdensity adjacent to the collection may reflect a suture line from prior surgery (series 2/image 53), less likely a foreign body. No free air. Sigmoid diverticulosis, without evidence of diverticulitis. Vascular/Lymphatic: No evidence of abdominal aortic aneurysm. No suspicious abdominopelvic lymphadenopathy. Reproductive: Uterus is within normal limits. Bilateral ovaries are within normal limits. Other: No abdominopelvic ascites. Musculoskeletal: Degenerative changes of the visualized thoracolumbar spine. IMPRESSION: No evidence of bowel obstruction.  Prior appendectomy. Inflammatory changes involving the distal ileum with associated 1.7 x 2.0 x 3.4 cm intramural fluid and gas collection, possibly reflecting a developing intramural abscess. Surrounding inflammatory changes. No free air. Differential considerations include infectious/inflammatory ileitis or possibly localized perforation secondary to foreign body or anastomotic breakdown. Electronically Signed   By: Julian Hy M.D.   On: 10/29/2015 12:42    Microbiology: No results found for this or any previous visit (from the past 240 hour(s)).   Labs: Basic Metabolic Panel:  Recent Labs Lab 10/29/15 1015 10/30/15 0543  NA 133* 139  K 3.9 4.2  CL 98* 106  CO2 23 23  GLUCOSE 140* 99  BUN 17 11  CREATININE 0.86 0.80  CALCIUM 9.3 8.6*   Liver Function Tests:  Recent Labs Lab 10/29/15 1015  AST 35  ALT 29  ALKPHOS 75  BILITOT 1.1  PROT 8.0  ALBUMIN 4.0    Recent Labs Lab  10/29/15 1015  LIPASE 28   No results for input(s): AMMONIA in the last 168 hours. CBC:  Recent Labs Lab 10/29/15 1015 10/30/15 0543  WBC 13.5* 6.5  NEUTROABS 11.4*  --   HGB 13.7 11.9*  HCT 40.9 37.6  MCV 91.9 96.4  PLT 305 261   Cardiac Enzymes: No results for input(s): CKTOTAL, CKMB, CKMBINDEX, TROPONINI in the last 168 hours. BNP: BNP (last 3 results) No results for input(s): BNP in the last 8760 hours.  ProBNP (last 3 results) No results for input(s): PROBNP in the last 8760 hours.  CBG: No results for input(s): GLUCAP in the last 168 hours.  Time coordinating discharge: 35 minutes  Signed:  Kimsey Demaree  [facility] 10/31/2015, 12:41 PM

## 2015-10-31 NOTE — Discharge Instructions (Signed)
Intra-abdominal abscess -be sure to complete your antibiotics course -you may take any over the counter probiotic to help build up good gut flora. -please call our office should you develop recurrent pain, fever, chills, nausea, vomiting -i have scheduled you a follow up with Dr. Johney Maine.  If you need to reschedule, please notify our office. -you do not have any diet or activity restrictions.

## 2015-11-01 DIAGNOSIS — Z7901 Long term (current) use of anticoagulants: Secondary | ICD-10-CM | POA: Diagnosis not present

## 2015-11-03 DIAGNOSIS — Z7901 Long term (current) use of anticoagulants: Secondary | ICD-10-CM | POA: Diagnosis not present

## 2015-11-06 DIAGNOSIS — Z7901 Long term (current) use of anticoagulants: Secondary | ICD-10-CM | POA: Diagnosis not present

## 2015-11-07 DIAGNOSIS — Z09 Encounter for follow-up examination after completed treatment for conditions other than malignant neoplasm: Secondary | ICD-10-CM | POA: Diagnosis not present

## 2015-11-07 DIAGNOSIS — K651 Peritoneal abscess: Secondary | ICD-10-CM | POA: Diagnosis not present

## 2015-11-07 DIAGNOSIS — Z7901 Long term (current) use of anticoagulants: Secondary | ICD-10-CM | POA: Diagnosis not present

## 2015-11-10 DIAGNOSIS — Z7901 Long term (current) use of anticoagulants: Secondary | ICD-10-CM | POA: Diagnosis not present

## 2015-11-14 DIAGNOSIS — Z7901 Long term (current) use of anticoagulants: Secondary | ICD-10-CM | POA: Diagnosis not present

## 2015-11-15 DIAGNOSIS — I471 Supraventricular tachycardia: Secondary | ICD-10-CM | POA: Diagnosis not present

## 2015-11-15 DIAGNOSIS — Z9889 Other specified postprocedural states: Secondary | ICD-10-CM | POA: Diagnosis not present

## 2015-11-15 DIAGNOSIS — I4891 Unspecified atrial fibrillation: Secondary | ICD-10-CM | POA: Diagnosis not present

## 2015-11-15 DIAGNOSIS — Z7901 Long term (current) use of anticoagulants: Secondary | ICD-10-CM | POA: Diagnosis not present

## 2015-11-15 DIAGNOSIS — Z950 Presence of cardiac pacemaker: Secondary | ICD-10-CM | POA: Diagnosis not present

## 2015-11-15 DIAGNOSIS — I517 Cardiomegaly: Secondary | ICD-10-CM | POA: Diagnosis not present

## 2015-11-15 DIAGNOSIS — I4892 Unspecified atrial flutter: Secondary | ICD-10-CM | POA: Diagnosis not present

## 2015-11-15 DIAGNOSIS — Z952 Presence of prosthetic heart valve: Secondary | ICD-10-CM | POA: Diagnosis not present

## 2015-11-17 DIAGNOSIS — Z7901 Long term (current) use of anticoagulants: Secondary | ICD-10-CM | POA: Diagnosis not present

## 2015-11-20 ENCOUNTER — Other Ambulatory Visit: Payer: Self-pay | Admitting: Surgery

## 2015-11-20 DIAGNOSIS — N9489 Other specified conditions associated with female genital organs and menstrual cycle: Secondary | ICD-10-CM | POA: Diagnosis not present

## 2015-11-20 DIAGNOSIS — K651 Peritoneal abscess: Secondary | ICD-10-CM | POA: Diagnosis not present

## 2015-11-20 DIAGNOSIS — R197 Diarrhea, unspecified: Secondary | ICD-10-CM | POA: Diagnosis not present

## 2015-11-20 DIAGNOSIS — IMO0002 Reserved for concepts with insufficient information to code with codable children: Secondary | ICD-10-CM

## 2015-11-23 ENCOUNTER — Ambulatory Visit
Admission: RE | Admit: 2015-11-23 | Discharge: 2015-11-23 | Disposition: A | Discharge status: Home / Self Care | Payer: Medicare Other | Source: Ambulatory Visit | Attending: Surgery | Admitting: Surgery

## 2015-11-23 DIAGNOSIS — IMO0002 Reserved for concepts with insufficient information to code with codable children: Secondary | ICD-10-CM

## 2015-11-23 DIAGNOSIS — K5730 Diverticulosis of large intestine without perforation or abscess without bleeding: Secondary | ICD-10-CM | POA: Diagnosis not present

## 2015-11-23 MED ORDER — IOPAMIDOL (ISOVUE-300) INJECTION 61%
100.0000 mL | Freq: Once | INTRAVENOUS | Status: AC | PRN
  Administered 2015-11-23: 100 mL via INTRAVENOUS

## 2015-11-24 DIAGNOSIS — Z7901 Long term (current) use of anticoagulants: Secondary | ICD-10-CM | POA: Diagnosis not present

## 2015-11-24 NOTE — Progress Notes (Signed)
Quick Note:  S/P ct SCAN OF ABD/PELVIS. Study reveals benign findings.  NO more abscess. NO SBO No evidence of cancer nor metastatic disease: Constipated   IMPRESSION: 1. No acute findings within the abdomen or pelvis with interval resolution of previously noted intramural abscess within the terminal ileum. 2. Stable sequela of prior appendectomy with additional surgical clips noted within the terminal ileum. 3. Large colonic stool burden without evidence of enteric obstruction. 4. Extensive colonic diverticulosis without evidence of diverticulitis.   Alisha CCS MA, please call and reassure the patient. Recommend increasing fiber bowel regimen (Double current fiber supplement or switch to flax seed or Miralax) to help with BMs and lower fullness feeling  Thanks,  Adin Hector, M.D., F.A.C.S. Gastrointestinal and Minimally Invasive Surgery [facility] [address], [place], [place] [address] [phone] Main / Paging   ______

## 2015-12-08 DIAGNOSIS — Z7901 Long term (current) use of anticoagulants: Secondary | ICD-10-CM | POA: Diagnosis not present

## 2015-12-19 DIAGNOSIS — N3281 Overactive bladder: Secondary | ICD-10-CM | POA: Diagnosis not present

## 2015-12-19 DIAGNOSIS — N909 Noninflammatory disorder of vulva and perineum, unspecified: Secondary | ICD-10-CM | POA: Diagnosis not present

## 2015-12-20 DIAGNOSIS — N390 Urinary tract infection, site not specified: Secondary | ICD-10-CM | POA: Diagnosis not present

## 2015-12-22 DIAGNOSIS — R002 Palpitations: Secondary | ICD-10-CM | POA: Diagnosis not present

## 2015-12-22 DIAGNOSIS — M76821 Posterior tibial tendinitis, right leg: Secondary | ICD-10-CM | POA: Diagnosis not present

## 2015-12-22 DIAGNOSIS — M71571 Other bursitis, not elsewhere classified, right ankle and foot: Secondary | ICD-10-CM | POA: Diagnosis not present

## 2015-12-22 DIAGNOSIS — Z7901 Long term (current) use of anticoagulants: Secondary | ICD-10-CM | POA: Diagnosis not present

## 2015-12-22 DIAGNOSIS — M19071 Primary osteoarthritis, right ankle and foot: Secondary | ICD-10-CM | POA: Diagnosis not present

## 2015-12-28 DIAGNOSIS — M71571 Other bursitis, not elsewhere classified, right ankle and foot: Secondary | ICD-10-CM | POA: Diagnosis not present

## 2015-12-28 DIAGNOSIS — Z7901 Long term (current) use of anticoagulants: Secondary | ICD-10-CM | POA: Diagnosis not present

## 2015-12-28 DIAGNOSIS — M76821 Posterior tibial tendinitis, right leg: Secondary | ICD-10-CM | POA: Diagnosis not present

## 2016-01-03 DIAGNOSIS — Z7901 Long term (current) use of anticoagulants: Secondary | ICD-10-CM | POA: Diagnosis not present

## 2016-01-10 DIAGNOSIS — Z7901 Long term (current) use of anticoagulants: Secondary | ICD-10-CM | POA: Diagnosis not present

## 2016-01-11 DIAGNOSIS — N3281 Overactive bladder: Secondary | ICD-10-CM | POA: Diagnosis not present

## 2016-01-11 DIAGNOSIS — Z950 Presence of cardiac pacemaker: Secondary | ICD-10-CM | POA: Diagnosis not present

## 2016-01-11 DIAGNOSIS — N3941 Urge incontinence: Secondary | ICD-10-CM | POA: Diagnosis not present

## 2016-01-11 DIAGNOSIS — I471 Supraventricular tachycardia: Secondary | ICD-10-CM | POA: Diagnosis not present

## 2016-01-12 DIAGNOSIS — M71571 Other bursitis, not elsewhere classified, right ankle and foot: Secondary | ICD-10-CM | POA: Diagnosis not present

## 2016-01-12 DIAGNOSIS — M722 Plantar fascial fibromatosis: Secondary | ICD-10-CM | POA: Diagnosis not present

## 2016-01-16 DIAGNOSIS — Z7901 Long term (current) use of anticoagulants: Secondary | ICD-10-CM | POA: Diagnosis not present

## 2016-01-16 DIAGNOSIS — R002 Palpitations: Secondary | ICD-10-CM | POA: Diagnosis not present

## 2016-01-23 DIAGNOSIS — Z7901 Long term (current) use of anticoagulants: Secondary | ICD-10-CM | POA: Diagnosis not present

## 2016-01-24 DIAGNOSIS — I481 Persistent atrial fibrillation: Secondary | ICD-10-CM | POA: Diagnosis not present

## 2016-01-24 DIAGNOSIS — I483 Typical atrial flutter: Secondary | ICD-10-CM | POA: Diagnosis not present

## 2016-02-02 DIAGNOSIS — J069 Acute upper respiratory infection, unspecified: Secondary | ICD-10-CM | POA: Diagnosis not present

## 2016-02-05 DIAGNOSIS — H109 Unspecified conjunctivitis: Secondary | ICD-10-CM | POA: Diagnosis not present

## 2016-02-07 DIAGNOSIS — Z7901 Long term (current) use of anticoagulants: Secondary | ICD-10-CM | POA: Diagnosis not present

## 2016-02-14 DIAGNOSIS — Z7901 Long term (current) use of anticoagulants: Secondary | ICD-10-CM | POA: Diagnosis not present

## 2016-02-16 DIAGNOSIS — M76821 Posterior tibial tendinitis, right leg: Secondary | ICD-10-CM | POA: Diagnosis not present

## 2016-02-16 DIAGNOSIS — M71571 Other bursitis, not elsewhere classified, right ankle and foot: Secondary | ICD-10-CM | POA: Diagnosis not present

## 2016-02-20 DIAGNOSIS — Z7901 Long term (current) use of anticoagulants: Secondary | ICD-10-CM | POA: Diagnosis not present

## 2016-03-05 DIAGNOSIS — Z7901 Long term (current) use of anticoagulants: Secondary | ICD-10-CM | POA: Diagnosis not present

## 2016-03-20 DIAGNOSIS — Z7901 Long term (current) use of anticoagulants: Secondary | ICD-10-CM | POA: Diagnosis not present

## 2016-03-29 DIAGNOSIS — Z7901 Long term (current) use of anticoagulants: Secondary | ICD-10-CM | POA: Diagnosis not present

## 2016-04-03 DIAGNOSIS — Z7901 Long term (current) use of anticoagulants: Secondary | ICD-10-CM | POA: Diagnosis not present

## 2016-04-15 DIAGNOSIS — N3946 Mixed incontinence: Secondary | ICD-10-CM | POA: Diagnosis not present

## 2016-04-15 DIAGNOSIS — N3281 Overactive bladder: Secondary | ICD-10-CM | POA: Diagnosis not present

## 2016-04-17 DIAGNOSIS — Z45018 Encounter for adjustment and management of other part of cardiac pacemaker: Secondary | ICD-10-CM | POA: Diagnosis not present

## 2016-04-17 DIAGNOSIS — I5022 Chronic systolic (congestive) heart failure: Secondary | ICD-10-CM | POA: Diagnosis not present

## 2016-04-17 DIAGNOSIS — I4891 Unspecified atrial fibrillation: Secondary | ICD-10-CM | POA: Diagnosis not present

## 2016-04-22 DIAGNOSIS — Z7901 Long term (current) use of anticoagulants: Secondary | ICD-10-CM | POA: Diagnosis not present

## 2016-04-22 DIAGNOSIS — I4891 Unspecified atrial fibrillation: Secondary | ICD-10-CM | POA: Diagnosis not present

## 2016-04-22 DIAGNOSIS — Z0000 Encounter for general adult medical examination without abnormal findings: Secondary | ICD-10-CM | POA: Diagnosis not present

## 2016-04-22 DIAGNOSIS — E559 Vitamin D deficiency, unspecified: Secondary | ICD-10-CM | POA: Diagnosis not present

## 2016-04-22 DIAGNOSIS — C9590 Leukemia, unspecified not having achieved remission: Secondary | ICD-10-CM | POA: Diagnosis not present

## 2016-04-22 DIAGNOSIS — Z952 Presence of prosthetic heart valve: Secondary | ICD-10-CM | POA: Diagnosis not present

## 2016-04-22 DIAGNOSIS — E785 Hyperlipidemia, unspecified: Secondary | ICD-10-CM | POA: Diagnosis not present

## 2016-05-07 DIAGNOSIS — Z7901 Long term (current) use of anticoagulants: Secondary | ICD-10-CM | POA: Diagnosis not present

## 2016-05-15 DIAGNOSIS — Z7901 Long term (current) use of anticoagulants: Secondary | ICD-10-CM | POA: Diagnosis not present

## 2016-05-22 DIAGNOSIS — Z7901 Long term (current) use of anticoagulants: Secondary | ICD-10-CM | POA: Diagnosis not present

## 2016-05-22 DIAGNOSIS — R399 Unspecified symptoms and signs involving the genitourinary system: Secondary | ICD-10-CM | POA: Diagnosis not present

## 2016-06-05 DIAGNOSIS — Z7901 Long term (current) use of anticoagulants: Secondary | ICD-10-CM | POA: Diagnosis not present

## 2016-06-13 DIAGNOSIS — Z23 Encounter for immunization: Secondary | ICD-10-CM | POA: Diagnosis not present

## 2016-06-13 DIAGNOSIS — Z7901 Long term (current) use of anticoagulants: Secondary | ICD-10-CM | POA: Diagnosis not present

## 2016-06-26 DIAGNOSIS — Z7901 Long term (current) use of anticoagulants: Secondary | ICD-10-CM | POA: Diagnosis not present

## 2016-06-27 DIAGNOSIS — R062 Wheezing: Secondary | ICD-10-CM | POA: Diagnosis not present

## 2016-06-27 DIAGNOSIS — J209 Acute bronchitis, unspecified: Secondary | ICD-10-CM | POA: Diagnosis not present

## 2016-07-02 DIAGNOSIS — Z7901 Long term (current) use of anticoagulants: Secondary | ICD-10-CM | POA: Diagnosis not present

## 2016-07-04 DIAGNOSIS — N3946 Mixed incontinence: Secondary | ICD-10-CM | POA: Diagnosis not present

## 2016-07-04 DIAGNOSIS — M6258 Muscle wasting and atrophy, not elsewhere classified, other site: Secondary | ICD-10-CM | POA: Diagnosis not present

## 2016-07-05 DIAGNOSIS — S20309A Unspecified superficial injuries of unspecified front wall of thorax, initial encounter: Secondary | ICD-10-CM | POA: Diagnosis not present

## 2016-07-05 DIAGNOSIS — S1080XA Unspecified superficial injury of other specified part of neck, initial encounter: Secondary | ICD-10-CM | POA: Diagnosis not present

## 2016-07-05 DIAGNOSIS — D2271 Melanocytic nevi of right lower limb, including hip: Secondary | ICD-10-CM | POA: Diagnosis not present

## 2016-07-05 DIAGNOSIS — L578 Other skin changes due to chronic exposure to nonionizing radiation: Secondary | ICD-10-CM | POA: Diagnosis not present

## 2016-07-05 DIAGNOSIS — L820 Inflamed seborrheic keratosis: Secondary | ICD-10-CM | POA: Diagnosis not present

## 2016-07-11 DIAGNOSIS — N3946 Mixed incontinence: Secondary | ICD-10-CM | POA: Diagnosis not present

## 2016-07-11 DIAGNOSIS — M6258 Muscle wasting and atrophy, not elsewhere classified, other site: Secondary | ICD-10-CM | POA: Diagnosis not present

## 2016-07-18 DIAGNOSIS — N3946 Mixed incontinence: Secondary | ICD-10-CM | POA: Diagnosis not present

## 2016-07-18 DIAGNOSIS — Z7901 Long term (current) use of anticoagulants: Secondary | ICD-10-CM | POA: Diagnosis not present

## 2016-07-18 DIAGNOSIS — M6258 Muscle wasting and atrophy, not elsewhere classified, other site: Secondary | ICD-10-CM | POA: Diagnosis not present

## 2016-07-24 DIAGNOSIS — I484 Atypical atrial flutter: Secondary | ICD-10-CM | POA: Diagnosis not present

## 2016-07-24 DIAGNOSIS — I483 Typical atrial flutter: Secondary | ICD-10-CM | POA: Diagnosis not present

## 2016-07-24 DIAGNOSIS — I481 Persistent atrial fibrillation: Secondary | ICD-10-CM | POA: Diagnosis not present

## 2016-07-25 DIAGNOSIS — M6250 Muscle wasting and atrophy, not elsewhere classified, unspecified site: Secondary | ICD-10-CM | POA: Diagnosis not present

## 2016-07-25 DIAGNOSIS — N3946 Mixed incontinence: Secondary | ICD-10-CM | POA: Diagnosis not present

## 2016-08-02 DIAGNOSIS — Z7901 Long term (current) use of anticoagulants: Secondary | ICD-10-CM | POA: Diagnosis not present

## 2016-08-07 DIAGNOSIS — Z7901 Long term (current) use of anticoagulants: Secondary | ICD-10-CM | POA: Diagnosis not present

## 2016-08-07 DIAGNOSIS — Z952 Presence of prosthetic heart valve: Secondary | ICD-10-CM | POA: Diagnosis not present

## 2016-08-12 DIAGNOSIS — H2513 Age-related nuclear cataract, bilateral: Secondary | ICD-10-CM | POA: Diagnosis not present

## 2016-08-13 DIAGNOSIS — F419 Anxiety disorder, unspecified: Secondary | ICD-10-CM | POA: Diagnosis not present

## 2016-08-15 DIAGNOSIS — N3946 Mixed incontinence: Secondary | ICD-10-CM | POA: Diagnosis not present

## 2016-08-15 DIAGNOSIS — M6258 Muscle wasting and atrophy, not elsewhere classified, other site: Secondary | ICD-10-CM | POA: Diagnosis not present

## 2016-08-16 DIAGNOSIS — I484 Atypical atrial flutter: Secondary | ICD-10-CM | POA: Diagnosis not present

## 2016-08-16 DIAGNOSIS — Z1231 Encounter for screening mammogram for malignant neoplasm of breast: Secondary | ICD-10-CM | POA: Diagnosis not present

## 2016-08-21 DIAGNOSIS — Z7901 Long term (current) use of anticoagulants: Secondary | ICD-10-CM | POA: Diagnosis not present

## 2016-08-27 DIAGNOSIS — I493 Ventricular premature depolarization: Secondary | ICD-10-CM | POA: Diagnosis not present

## 2016-08-27 DIAGNOSIS — I484 Atypical atrial flutter: Secondary | ICD-10-CM | POA: Diagnosis not present

## 2016-09-03 DIAGNOSIS — Z45018 Encounter for adjustment and management of other part of cardiac pacemaker: Secondary | ICD-10-CM | POA: Diagnosis not present

## 2016-09-03 DIAGNOSIS — I471 Supraventricular tachycardia: Secondary | ICD-10-CM | POA: Diagnosis not present

## 2016-09-03 DIAGNOSIS — I4891 Unspecified atrial fibrillation: Secondary | ICD-10-CM | POA: Diagnosis not present

## 2016-09-03 DIAGNOSIS — Z950 Presence of cardiac pacemaker: Secondary | ICD-10-CM | POA: Diagnosis not present

## 2016-09-04 DIAGNOSIS — Z7901 Long term (current) use of anticoagulants: Secondary | ICD-10-CM | POA: Diagnosis not present

## 2016-09-11 DIAGNOSIS — F419 Anxiety disorder, unspecified: Secondary | ICD-10-CM | POA: Diagnosis not present

## 2016-09-12 DIAGNOSIS — N3946 Mixed incontinence: Secondary | ICD-10-CM | POA: Diagnosis not present

## 2016-09-12 DIAGNOSIS — M6258 Muscle wasting and atrophy, not elsewhere classified, other site: Secondary | ICD-10-CM | POA: Diagnosis not present

## 2016-09-18 DIAGNOSIS — Z7901 Long term (current) use of anticoagulants: Secondary | ICD-10-CM | POA: Diagnosis not present

## 2016-09-24 DIAGNOSIS — I472 Ventricular tachycardia: Secondary | ICD-10-CM | POA: Diagnosis not present

## 2016-09-24 DIAGNOSIS — Z950 Presence of cardiac pacemaker: Secondary | ICD-10-CM | POA: Diagnosis not present

## 2016-09-24 DIAGNOSIS — I480 Paroxysmal atrial fibrillation: Secondary | ICD-10-CM | POA: Diagnosis not present

## 2016-09-25 DIAGNOSIS — Z7901 Long term (current) use of anticoagulants: Secondary | ICD-10-CM | POA: Diagnosis not present

## 2016-10-04 DIAGNOSIS — Z7901 Long term (current) use of anticoagulants: Secondary | ICD-10-CM | POA: Diagnosis not present

## 2016-10-14 DIAGNOSIS — Z7901 Long term (current) use of anticoagulants: Secondary | ICD-10-CM | POA: Diagnosis not present

## 2016-10-15 DIAGNOSIS — F419 Anxiety disorder, unspecified: Secondary | ICD-10-CM | POA: Diagnosis not present

## 2016-10-28 DIAGNOSIS — Z7901 Long term (current) use of anticoagulants: Secondary | ICD-10-CM | POA: Diagnosis not present

## 2016-11-04 DIAGNOSIS — Z7901 Long term (current) use of anticoagulants: Secondary | ICD-10-CM | POA: Diagnosis not present

## 2016-11-13 DIAGNOSIS — F419 Anxiety disorder, unspecified: Secondary | ICD-10-CM | POA: Diagnosis not present

## 2016-11-14 DIAGNOSIS — L820 Inflamed seborrheic keratosis: Secondary | ICD-10-CM | POA: Diagnosis not present

## 2016-11-14 DIAGNOSIS — L578 Other skin changes due to chronic exposure to nonionizing radiation: Secondary | ICD-10-CM | POA: Diagnosis not present

## 2016-11-14 DIAGNOSIS — D2271 Melanocytic nevi of right lower limb, including hip: Secondary | ICD-10-CM | POA: Diagnosis not present

## 2016-11-14 DIAGNOSIS — L538 Other specified erythematous conditions: Secondary | ICD-10-CM | POA: Diagnosis not present

## 2016-11-19 DIAGNOSIS — Z7901 Long term (current) use of anticoagulants: Secondary | ICD-10-CM | POA: Diagnosis not present

## 2016-12-02 DIAGNOSIS — Z6833 Body mass index (BMI) 33.0-33.9, adult: Secondary | ICD-10-CM | POA: Diagnosis not present

## 2016-12-02 DIAGNOSIS — Z01419 Encounter for gynecological examination (general) (routine) without abnormal findings: Secondary | ICD-10-CM | POA: Diagnosis not present

## 2016-12-03 DIAGNOSIS — Z7901 Long term (current) use of anticoagulants: Secondary | ICD-10-CM | POA: Diagnosis not present

## 2016-12-13 DIAGNOSIS — I481 Persistent atrial fibrillation: Secondary | ICD-10-CM | POA: Diagnosis not present

## 2016-12-13 DIAGNOSIS — I484 Atypical atrial flutter: Secondary | ICD-10-CM | POA: Diagnosis not present

## 2016-12-13 DIAGNOSIS — I483 Typical atrial flutter: Secondary | ICD-10-CM | POA: Diagnosis not present

## 2016-12-17 DIAGNOSIS — Z7901 Long term (current) use of anticoagulants: Secondary | ICD-10-CM | POA: Diagnosis not present

## 2016-12-19 DIAGNOSIS — R509 Fever, unspecified: Secondary | ICD-10-CM | POA: Diagnosis not present

## 2016-12-19 DIAGNOSIS — J9801 Acute bronchospasm: Secondary | ICD-10-CM | POA: Diagnosis not present

## 2016-12-19 DIAGNOSIS — B349 Viral infection, unspecified: Secondary | ICD-10-CM | POA: Diagnosis not present

## 2016-12-23 DIAGNOSIS — R05 Cough: Secondary | ICD-10-CM | POA: Diagnosis not present

## 2016-12-24 DIAGNOSIS — I480 Paroxysmal atrial fibrillation: Secondary | ICD-10-CM | POA: Diagnosis not present

## 2016-12-31 DIAGNOSIS — Z7901 Long term (current) use of anticoagulants: Secondary | ICD-10-CM | POA: Diagnosis not present

## 2017-01-14 DIAGNOSIS — Z7901 Long term (current) use of anticoagulants: Secondary | ICD-10-CM | POA: Diagnosis not present

## 2017-01-21 DIAGNOSIS — M76821 Posterior tibial tendinitis, right leg: Secondary | ICD-10-CM | POA: Diagnosis not present

## 2017-01-28 DIAGNOSIS — Z7901 Long term (current) use of anticoagulants: Secondary | ICD-10-CM | POA: Diagnosis not present

## 2017-01-29 DIAGNOSIS — Z7901 Long term (current) use of anticoagulants: Secondary | ICD-10-CM | POA: Diagnosis not present

## 2017-01-29 DIAGNOSIS — I4891 Unspecified atrial fibrillation: Secondary | ICD-10-CM | POA: Diagnosis not present

## 2017-01-29 DIAGNOSIS — Z882 Allergy status to sulfonamides status: Secondary | ICD-10-CM | POA: Diagnosis not present

## 2017-01-29 DIAGNOSIS — Z9889 Other specified postprocedural states: Secondary | ICD-10-CM | POA: Diagnosis not present

## 2017-01-29 DIAGNOSIS — Z888 Allergy status to other drugs, medicaments and biological substances status: Secondary | ICD-10-CM | POA: Diagnosis not present

## 2017-01-29 DIAGNOSIS — Z952 Presence of prosthetic heart valve: Secondary | ICD-10-CM | POA: Diagnosis not present

## 2017-01-29 DIAGNOSIS — Z881 Allergy status to other antibiotic agents status: Secondary | ICD-10-CM | POA: Diagnosis not present

## 2017-01-29 DIAGNOSIS — I483 Typical atrial flutter: Secondary | ICD-10-CM | POA: Diagnosis not present

## 2017-01-29 DIAGNOSIS — Z886 Allergy status to analgesic agent status: Secondary | ICD-10-CM | POA: Diagnosis not present

## 2017-02-04 DIAGNOSIS — Z7901 Long term (current) use of anticoagulants: Secondary | ICD-10-CM | POA: Diagnosis not present

## 2017-02-05 DIAGNOSIS — F419 Anxiety disorder, unspecified: Secondary | ICD-10-CM | POA: Diagnosis not present

## 2017-02-19 DIAGNOSIS — R9431 Abnormal electrocardiogram [ECG] [EKG]: Secondary | ICD-10-CM | POA: Diagnosis not present

## 2017-02-19 DIAGNOSIS — Z7901 Long term (current) use of anticoagulants: Secondary | ICD-10-CM | POA: Diagnosis not present

## 2017-02-27 DIAGNOSIS — Z7901 Long term (current) use of anticoagulants: Secondary | ICD-10-CM | POA: Diagnosis not present

## 2017-03-13 DIAGNOSIS — Z7901 Long term (current) use of anticoagulants: Secondary | ICD-10-CM | POA: Diagnosis not present

## 2017-03-20 DIAGNOSIS — F419 Anxiety disorder, unspecified: Secondary | ICD-10-CM | POA: Diagnosis not present

## 2017-03-21 DIAGNOSIS — Z5181 Encounter for therapeutic drug level monitoring: Secondary | ICD-10-CM | POA: Diagnosis not present

## 2017-03-21 DIAGNOSIS — Z7901 Long term (current) use of anticoagulants: Secondary | ICD-10-CM | POA: Diagnosis not present

## 2017-03-25 DIAGNOSIS — I480 Paroxysmal atrial fibrillation: Secondary | ICD-10-CM | POA: Diagnosis not present

## 2017-03-25 DIAGNOSIS — I471 Supraventricular tachycardia: Secondary | ICD-10-CM | POA: Diagnosis not present

## 2017-03-25 DIAGNOSIS — Z45018 Encounter for adjustment and management of other part of cardiac pacemaker: Secondary | ICD-10-CM | POA: Diagnosis not present

## 2017-04-08 DIAGNOSIS — Z952 Presence of prosthetic heart valve: Secondary | ICD-10-CM | POA: Diagnosis not present

## 2017-04-17 DIAGNOSIS — F419 Anxiety disorder, unspecified: Secondary | ICD-10-CM | POA: Diagnosis not present

## 2017-04-22 DIAGNOSIS — Z7901 Long term (current) use of anticoagulants: Secondary | ICD-10-CM | POA: Diagnosis not present

## 2017-04-29 DIAGNOSIS — Z5181 Encounter for therapeutic drug level monitoring: Secondary | ICD-10-CM | POA: Diagnosis not present

## 2017-04-29 DIAGNOSIS — Z7901 Long term (current) use of anticoagulants: Secondary | ICD-10-CM | POA: Diagnosis not present

## 2017-04-29 DIAGNOSIS — E785 Hyperlipidemia, unspecified: Secondary | ICD-10-CM | POA: Diagnosis not present

## 2017-04-29 DIAGNOSIS — E559 Vitamin D deficiency, unspecified: Secondary | ICD-10-CM | POA: Diagnosis not present

## 2017-04-30 IMAGING — CR DG CHEST 2V
2 series · 2 of 2 positions shown · non-contrast
Comparison: 06/20/2015.

CLINICAL DATA: Cough and congestion .

EXAM:
CHEST  2 VIEW

[w chest pa]
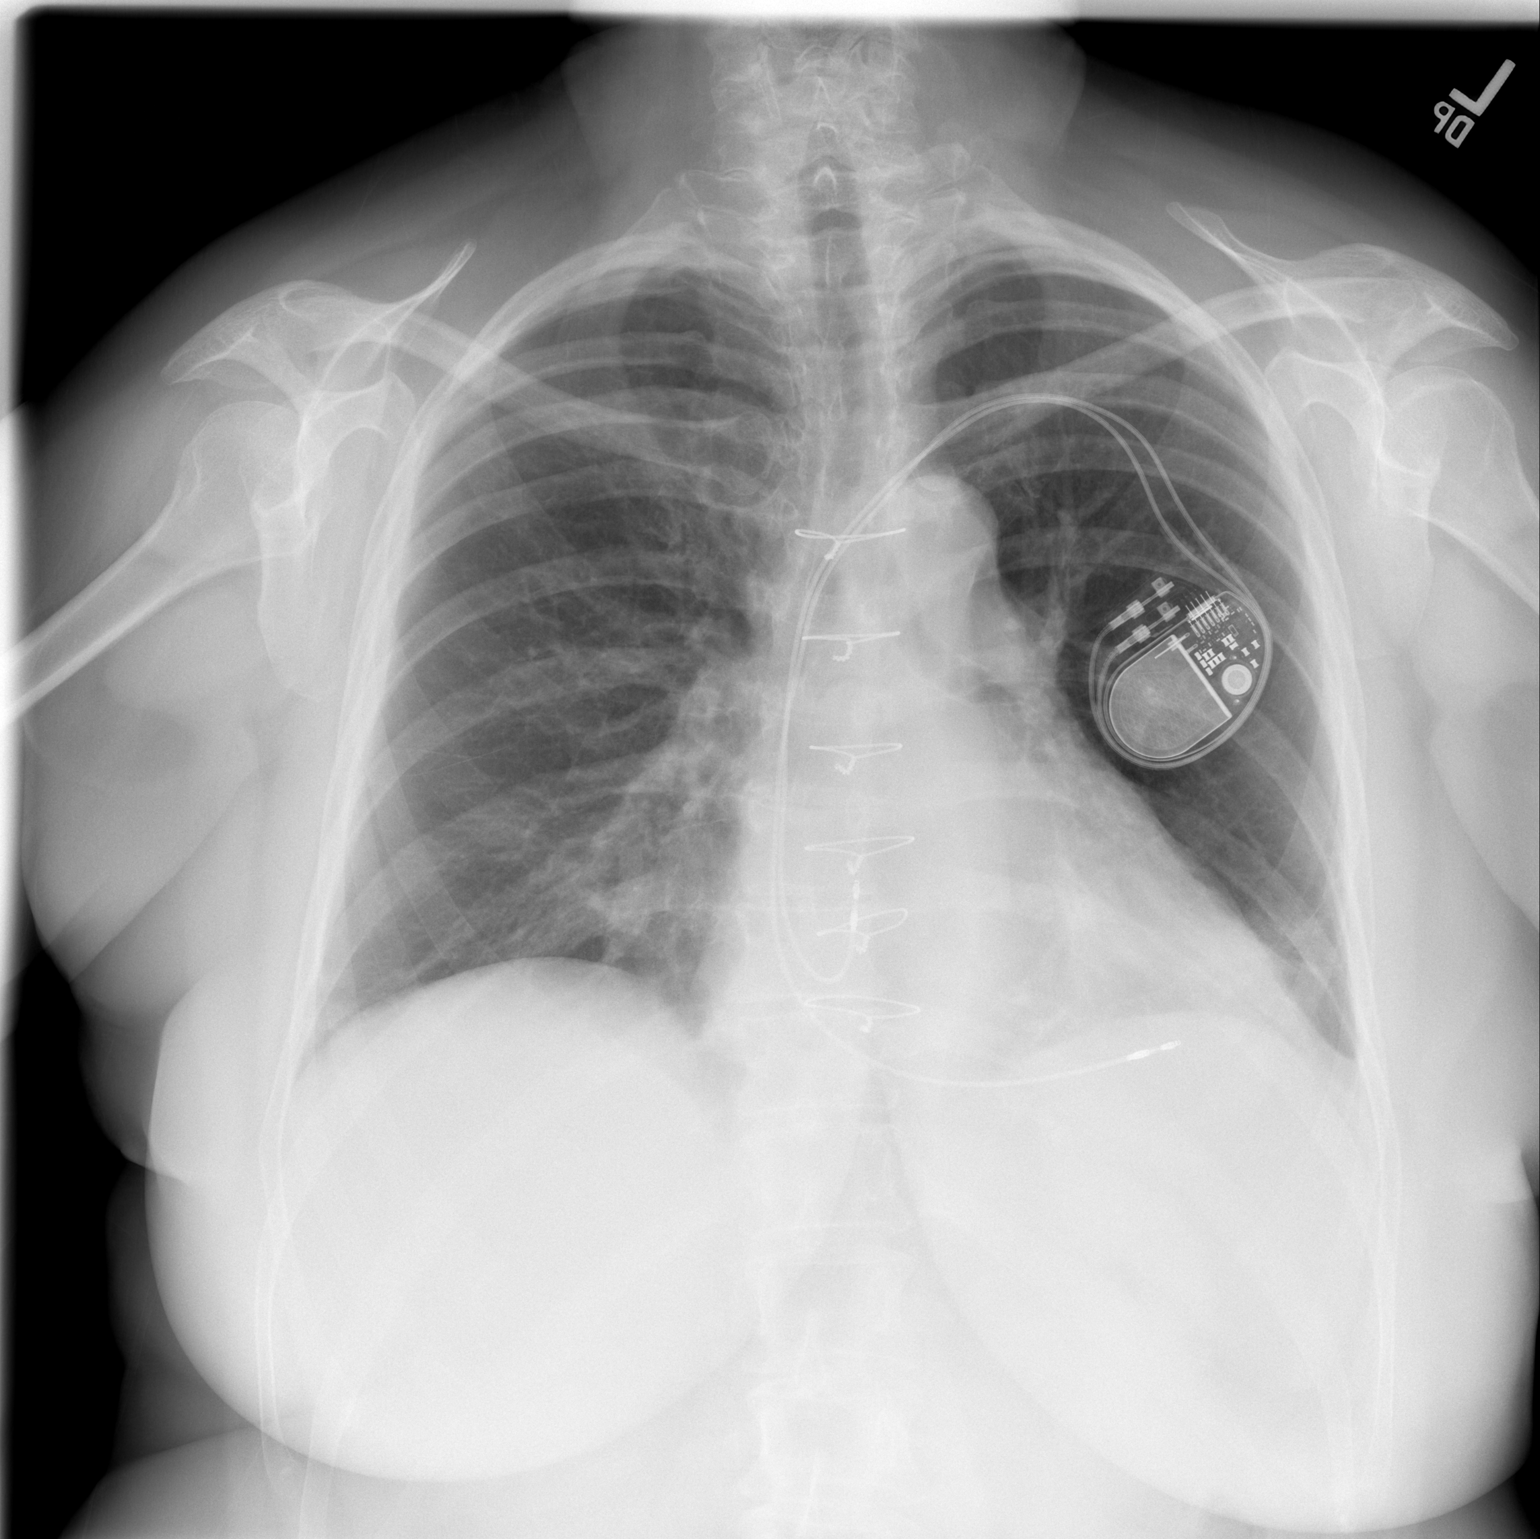

[w chest lat]
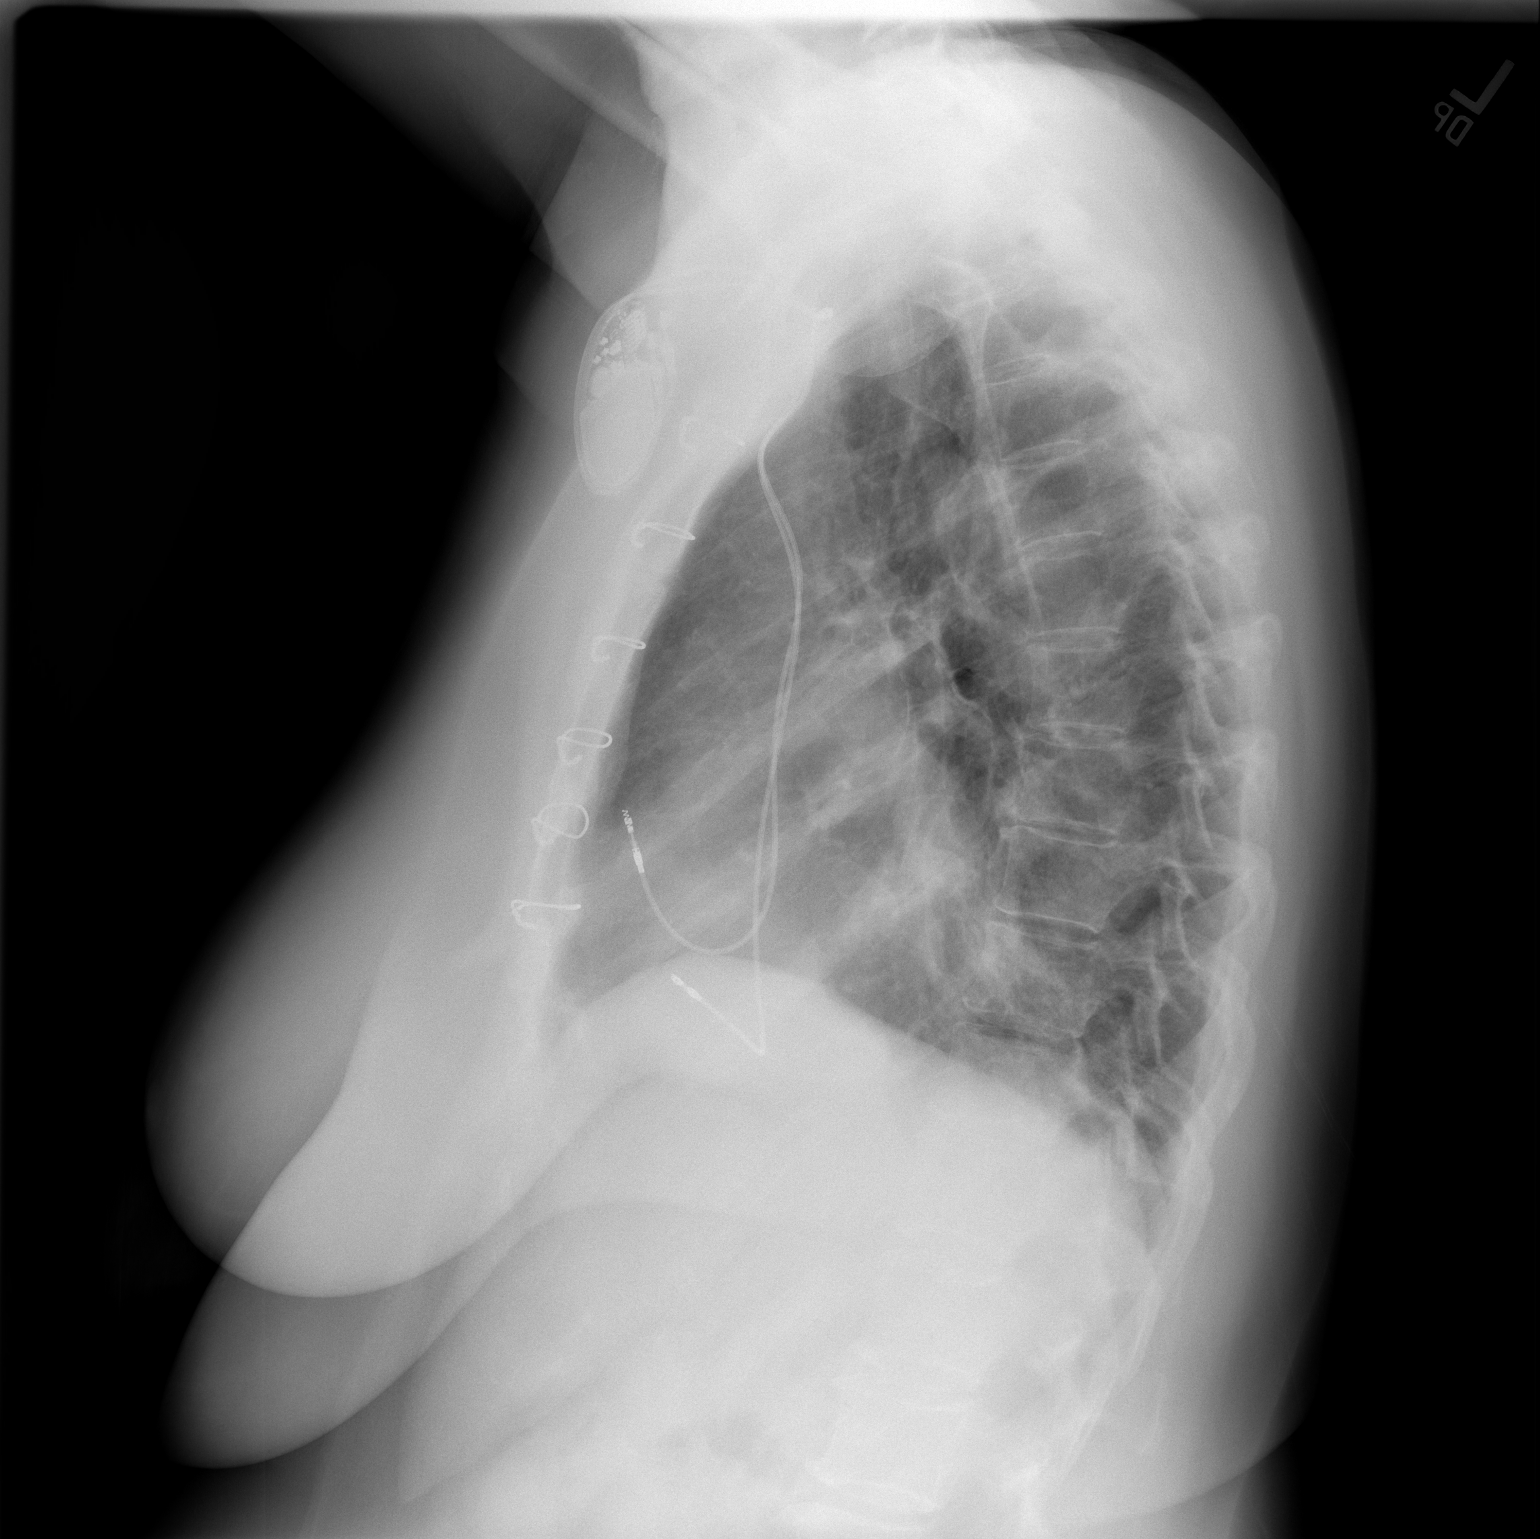

[2 of 2 positions shown; findings below may reference images not displayed]

FINDINGS: Cardiac pacer noted with lead tip in right atrium right ventricle.
Prior median sternotomy. Cardiomegaly with normal pulmonary
vascularity. Mild bibasilar infiltrates cannot be excluded. No
pleural effusion or pneumothorax.
IMPRESSION: 1. Cardiac pacer with lead tips in right atrium right ventricle.
Prior median sternotomy. Cardiomegaly, no pulmonary venous
congestion.
2. Mild bibasilar atelectasis and/or infiltrates.

## 2017-05-07 DIAGNOSIS — I4891 Unspecified atrial fibrillation: Secondary | ICD-10-CM | POA: Diagnosis not present

## 2017-05-07 DIAGNOSIS — Z7901 Long term (current) use of anticoagulants: Secondary | ICD-10-CM | POA: Diagnosis not present

## 2017-05-07 DIAGNOSIS — G4733 Obstructive sleep apnea (adult) (pediatric): Secondary | ICD-10-CM | POA: Diagnosis not present

## 2017-05-07 DIAGNOSIS — Z5181 Encounter for therapeutic drug level monitoring: Secondary | ICD-10-CM | POA: Diagnosis not present

## 2017-05-07 DIAGNOSIS — E785 Hyperlipidemia, unspecified: Secondary | ICD-10-CM | POA: Diagnosis not present

## 2017-05-07 DIAGNOSIS — Z0000 Encounter for general adult medical examination without abnormal findings: Secondary | ICD-10-CM | POA: Diagnosis not present

## 2017-05-07 DIAGNOSIS — Z6834 Body mass index (BMI) 34.0-34.9, adult: Secondary | ICD-10-CM | POA: Diagnosis not present

## 2017-05-21 DIAGNOSIS — Z7901 Long term (current) use of anticoagulants: Secondary | ICD-10-CM | POA: Diagnosis not present

## 2017-06-04 DIAGNOSIS — Z7901 Long term (current) use of anticoagulants: Secondary | ICD-10-CM | POA: Diagnosis not present

## 2017-06-18 DIAGNOSIS — Z7901 Long term (current) use of anticoagulants: Secondary | ICD-10-CM | POA: Diagnosis not present

## 2017-06-20 DIAGNOSIS — I483 Typical atrial flutter: Secondary | ICD-10-CM | POA: Diagnosis not present

## 2017-06-20 DIAGNOSIS — I484 Atypical atrial flutter: Secondary | ICD-10-CM | POA: Diagnosis not present

## 2017-06-20 DIAGNOSIS — I481 Persistent atrial fibrillation: Secondary | ICD-10-CM | POA: Diagnosis not present

## 2017-06-24 DIAGNOSIS — I480 Paroxysmal atrial fibrillation: Secondary | ICD-10-CM | POA: Diagnosis not present

## 2017-06-24 DIAGNOSIS — I471 Supraventricular tachycardia: Secondary | ICD-10-CM | POA: Diagnosis not present

## 2017-06-24 DIAGNOSIS — Z45018 Encounter for adjustment and management of other part of cardiac pacemaker: Secondary | ICD-10-CM | POA: Diagnosis not present

## 2017-06-25 DIAGNOSIS — Z7901 Long term (current) use of anticoagulants: Secondary | ICD-10-CM | POA: Diagnosis not present

## 2017-07-01 DIAGNOSIS — R05 Cough: Secondary | ICD-10-CM | POA: Diagnosis not present

## 2017-07-08 DIAGNOSIS — Z7901 Long term (current) use of anticoagulants: Secondary | ICD-10-CM | POA: Diagnosis not present

## 2017-07-14 DIAGNOSIS — Z7901 Long term (current) use of anticoagulants: Secondary | ICD-10-CM | POA: Diagnosis not present

## 2017-07-14 DIAGNOSIS — Z23 Encounter for immunization: Secondary | ICD-10-CM | POA: Diagnosis not present

## 2017-07-15 DIAGNOSIS — I4892 Unspecified atrial flutter: Secondary | ICD-10-CM | POA: Diagnosis not present

## 2017-07-23 DIAGNOSIS — Z7901 Long term (current) use of anticoagulants: Secondary | ICD-10-CM | POA: Diagnosis not present

## 2017-07-30 DIAGNOSIS — Z7901 Long term (current) use of anticoagulants: Secondary | ICD-10-CM | POA: Diagnosis not present

## 2017-08-13 DIAGNOSIS — I4891 Unspecified atrial fibrillation: Secondary | ICD-10-CM | POA: Diagnosis not present

## 2017-08-13 DIAGNOSIS — Z952 Presence of prosthetic heart valve: Secondary | ICD-10-CM | POA: Diagnosis not present

## 2017-08-13 DIAGNOSIS — I4892 Unspecified atrial flutter: Secondary | ICD-10-CM | POA: Diagnosis not present

## 2017-08-13 DIAGNOSIS — Z7901 Long term (current) use of anticoagulants: Secondary | ICD-10-CM | POA: Diagnosis not present

## 2017-08-19 DIAGNOSIS — Z1231 Encounter for screening mammogram for malignant neoplasm of breast: Secondary | ICD-10-CM | POA: Diagnosis not present

## 2017-08-27 DIAGNOSIS — Z7901 Long term (current) use of anticoagulants: Secondary | ICD-10-CM | POA: Diagnosis not present

## 2017-09-08 DIAGNOSIS — Z7901 Long term (current) use of anticoagulants: Secondary | ICD-10-CM | POA: Diagnosis not present

## 2017-09-17 DIAGNOSIS — H2513 Age-related nuclear cataract, bilateral: Secondary | ICD-10-CM | POA: Diagnosis not present

## 2017-09-17 DIAGNOSIS — Z7901 Long term (current) use of anticoagulants: Secondary | ICD-10-CM | POA: Diagnosis not present

## 2017-09-18 DIAGNOSIS — L820 Inflamed seborrheic keratosis: Secondary | ICD-10-CM | POA: Diagnosis not present

## 2017-09-18 DIAGNOSIS — L538 Other specified erythematous conditions: Secondary | ICD-10-CM | POA: Diagnosis not present

## 2017-09-24 DIAGNOSIS — I10 Essential (primary) hypertension: Secondary | ICD-10-CM | POA: Diagnosis not present

## 2017-09-24 DIAGNOSIS — Z79899 Other long term (current) drug therapy: Secondary | ICD-10-CM | POA: Diagnosis not present

## 2017-09-24 DIAGNOSIS — Z952 Presence of prosthetic heart valve: Secondary | ICD-10-CM | POA: Diagnosis not present

## 2017-09-24 DIAGNOSIS — Z9889 Other specified postprocedural states: Secondary | ICD-10-CM | POA: Diagnosis not present

## 2017-09-24 DIAGNOSIS — Z950 Presence of cardiac pacemaker: Secondary | ICD-10-CM | POA: Diagnosis not present

## 2017-09-24 DIAGNOSIS — I480 Paroxysmal atrial fibrillation: Secondary | ICD-10-CM | POA: Diagnosis not present

## 2017-09-24 DIAGNOSIS — Z7901 Long term (current) use of anticoagulants: Secondary | ICD-10-CM | POA: Diagnosis not present

## 2017-09-24 DIAGNOSIS — Z9989 Dependence on other enabling machines and devices: Secondary | ICD-10-CM | POA: Diagnosis not present

## 2017-09-24 DIAGNOSIS — G4733 Obstructive sleep apnea (adult) (pediatric): Secondary | ICD-10-CM | POA: Diagnosis not present

## 2017-10-01 DIAGNOSIS — Z7901 Long term (current) use of anticoagulants: Secondary | ICD-10-CM | POA: Diagnosis not present

## 2017-10-02 DIAGNOSIS — F419 Anxiety disorder, unspecified: Secondary | ICD-10-CM | POA: Diagnosis not present

## 2017-10-03 IMAGING — CT CT ABD-PELV W/ CM
2 of 5 series · 15 of 46 positions shown, 17 images · IV contrast (APPLIED)
Comparison: CT abdomen pelvis - 10/29/2015

CLINICAL DATA: Loose stools. History of perforated appendicitis.
History of indeterminate fluid collections seen within the right
lower abdominal quadrant on CT scan performed 10/29/2015

EXAM:
CT ABDOMEN AND PELVIS WITH CONTRAST
TECHNIQUE: Multidetector CT imaging of the abdomen and pelvis was performed
using the standard protocol following bolus administration of
intravenous contrast.
CONTRAST:  100mL [id number] IOPAMIDOL ([id number]) INJECTION 61%

[Series 2: abd/pelvis w/cm · axial · 0.74mm/px · z∈[-506,-106]mm · 12 of 92 slices shown, 14 images]
[im 6/92  soft-tissue]
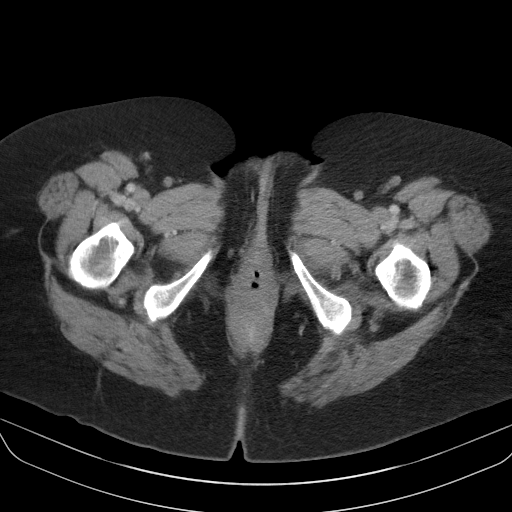
[im 6/92  bone]
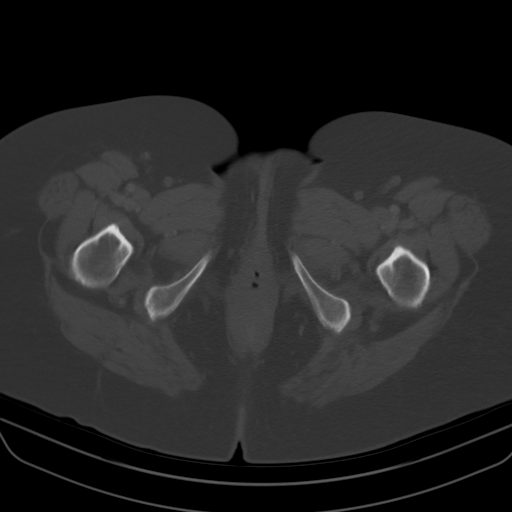
[im 16/92  soft-tissue]
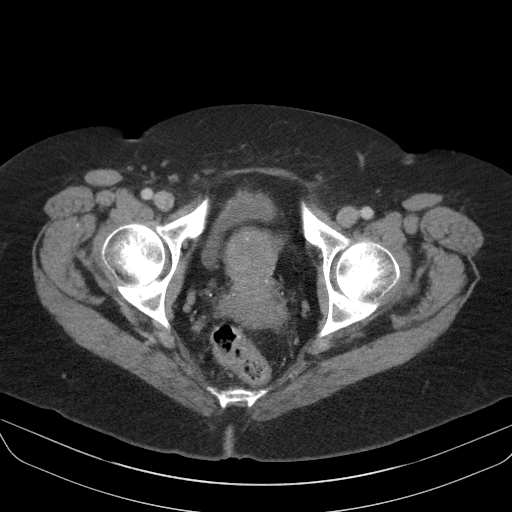
[im 21/92  soft-tissue]
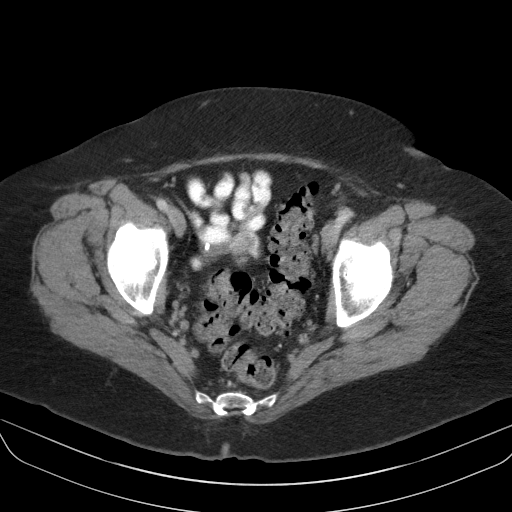
[im 26/92  soft-tissue]
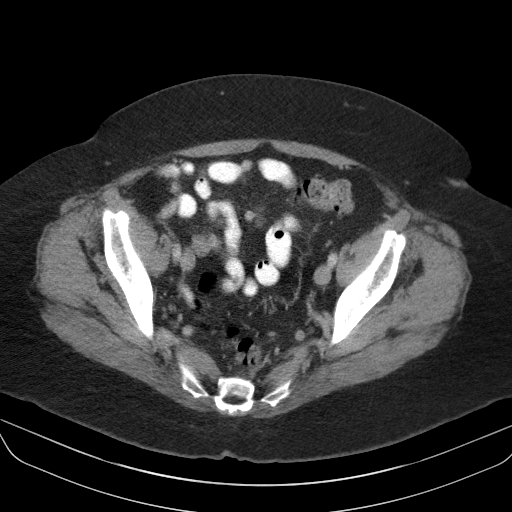
[im 36/92  soft-tissue]
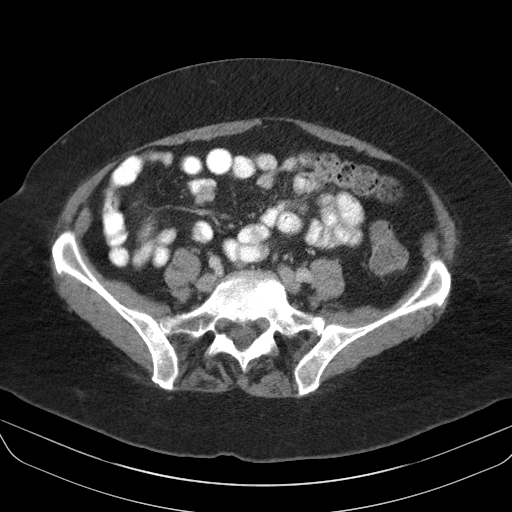
[im 41/92  soft-tissue]
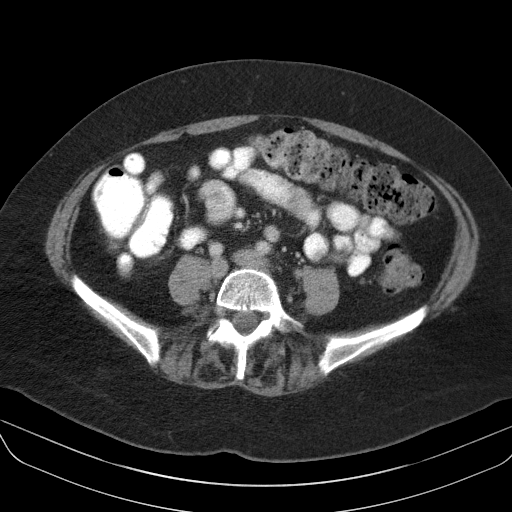
[im 51/92  soft-tissue]
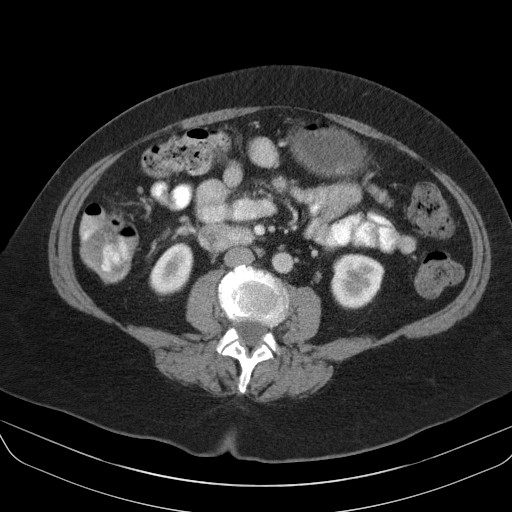
[im 56/92  soft-tissue]
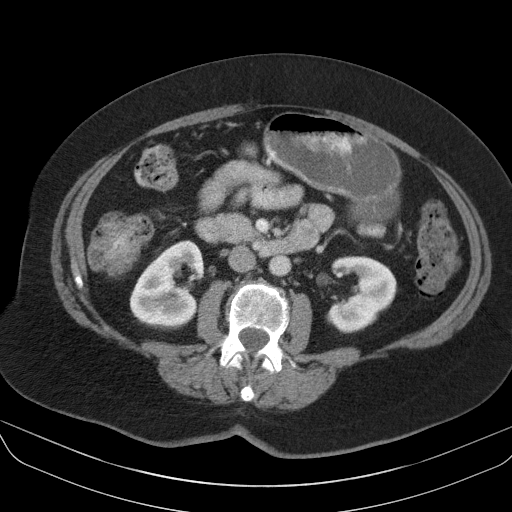
[im 66/92  soft-tissue]
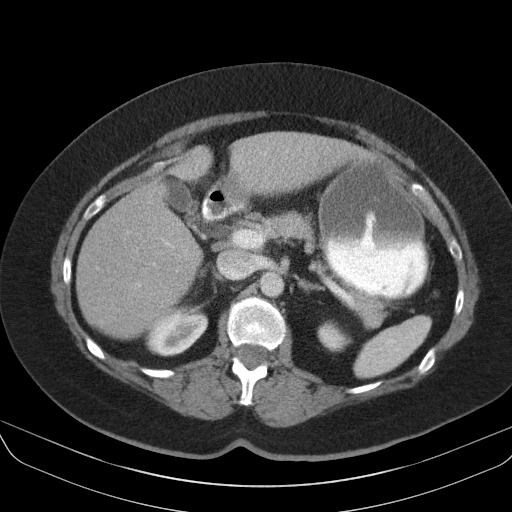
[im 66/92  bone]
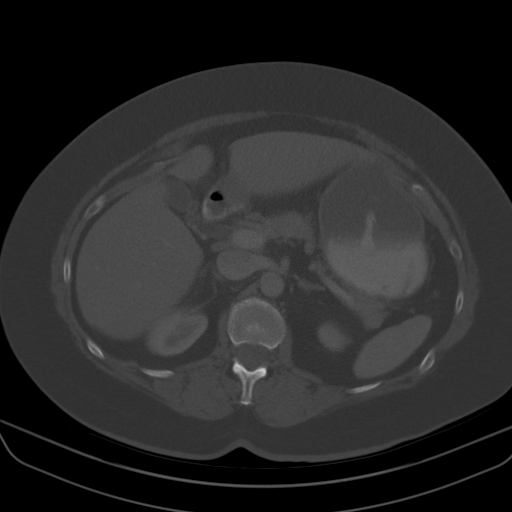
[im 71/92  soft-tissue]
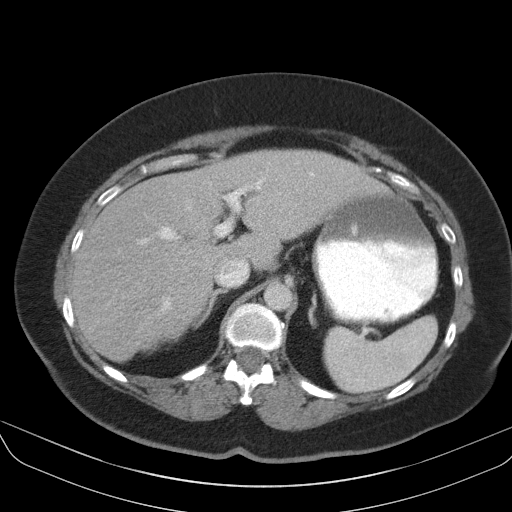
[im 76/92  soft-tissue]
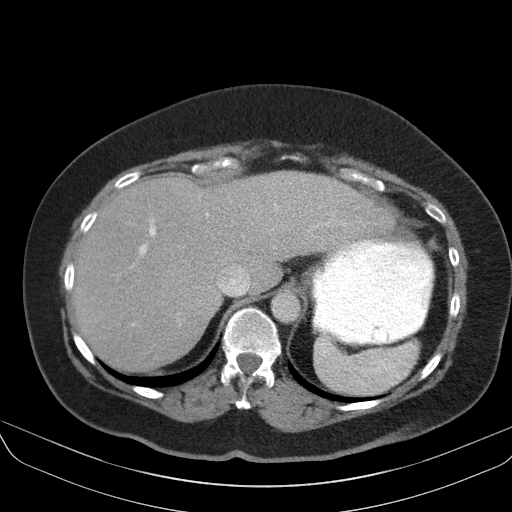
[im 86/92  soft-tissue]
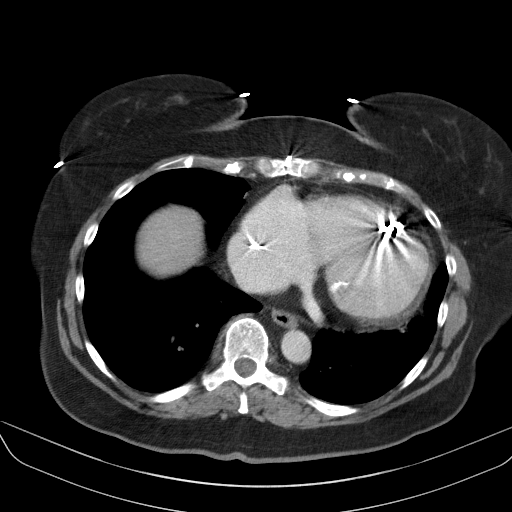

[Series 3: cor · coronal · 0.76mm/px · 3 of 93 slices shown]
[im 31/93  soft-tissue]
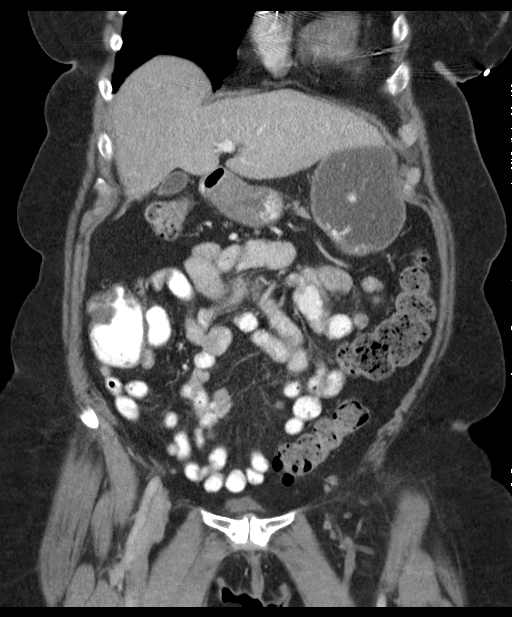
[im 41/93  soft-tissue]
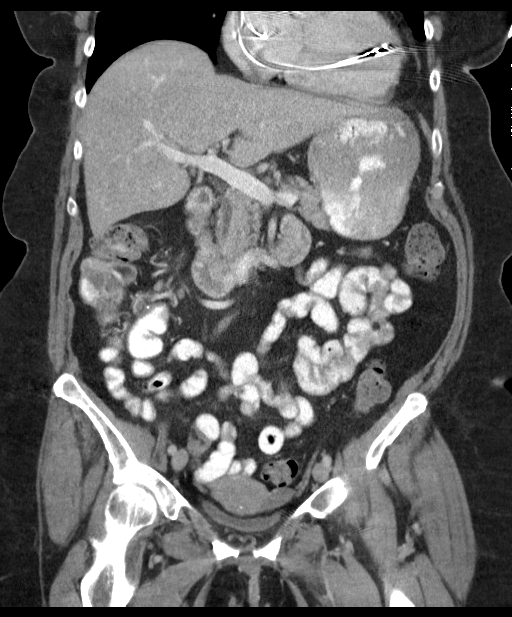
[im 52/93  soft-tissue]
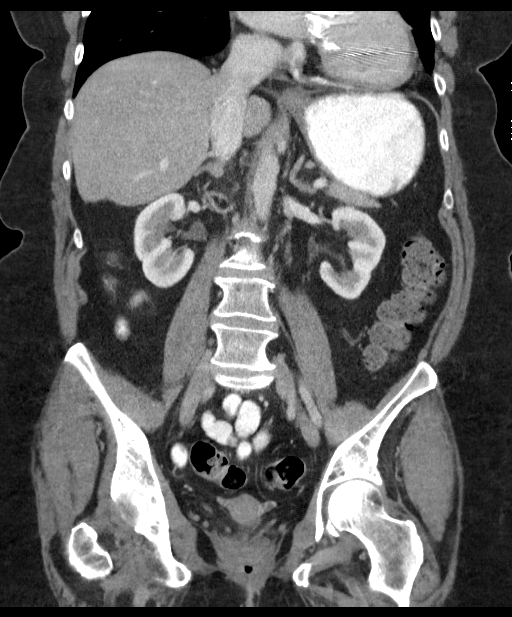

[15 of 46 positions shown; findings below may reference images not displayed]

FINDINGS: Stable sequela of prior appendectomy with additional surgical clips
seen within the terminal ileum (representative images 30 and 45,
series 3). The previous identified suspected developing intramural
abscess within the terminal ileum has resolved in the interval.

Enteric contrast extends to the level of the proximal ascending
colon. Large colonic stool burden without evidence of enteric
obstruction. Extensive colonic diverticulosis within the sigmoid
colon. No pneumoperitoneum, pneumatosis or portal venous gas.

Normal hepatic contour. No discrete hepatic lesions. Normal
appearance of the gallbladder given degree distention. No radiopaque
gallstones. No intra extrahepatic bili duct dilatation. No ascites.

There is symmetric enhancement and excretion of the bilateral
kidneys. No definite renal stones in this postcontrast examination.
No discrete renal lesions. No urinary obstruction or perinephric
stranding.

Normal appearance of the bilateral adrenal glands, pancreas and
spleen. Incidental note is made of a small splenule.

The abdominal aorta is tortuous but of normal caliber. The major
branch vessels of the abdominal aorta appear widely patent on this
non CTA examination.

No bulky retroperitoneal, mesenteric, pelvic or inguinal
lymphadenopathy.

Normal appearance of the pelvic organs. No free fluid in the pelvic
cul-de-sac.

Limited visualization of lower thorax is negative for focal airspace
opacity or pleural effusion.

Cardiomegaly. Pacer leads are seen within the right atrium and
ventricle. Exuberant calcifications within the mitral valve annulus.
No pericardial effusion.

No acute or aggressive osseous abnormalities. Moderate DDD of L5-S1
with disc space height loss, endplate irregularity and sclerosis. A
bone island is again seen within the left sacral ala. Degenerative
change of the pubic symphysis.

Tiny mesenteric fat containing failed local hernia. Regional soft
tissues appear otherwise normal.
IMPRESSION: 1. No acute findings within the abdomen or pelvis with interval
resolution of previously noted intramural abscess within the
terminal ileum.
2. Stable sequela of prior appendectomy with additional surgical
clips noted within the terminal ileum.
3. Large colonic stool burden without evidence of enteric
obstruction.
4. Extensive colonic diverticulosis without evidence of
diverticulitis.

## 2017-10-10 DIAGNOSIS — I471 Supraventricular tachycardia: Secondary | ICD-10-CM | POA: Diagnosis not present

## 2017-10-10 DIAGNOSIS — Z45018 Encounter for adjustment and management of other part of cardiac pacemaker: Secondary | ICD-10-CM | POA: Diagnosis not present

## 2017-10-12 DIAGNOSIS — I471 Supraventricular tachycardia: Secondary | ICD-10-CM | POA: Diagnosis not present

## 2017-10-12 DIAGNOSIS — Z45018 Encounter for adjustment and management of other part of cardiac pacemaker: Secondary | ICD-10-CM | POA: Diagnosis not present

## 2017-10-13 DIAGNOSIS — Z7901 Long term (current) use of anticoagulants: Secondary | ICD-10-CM | POA: Diagnosis not present

## 2017-10-17 DIAGNOSIS — J329 Chronic sinusitis, unspecified: Secondary | ICD-10-CM | POA: Diagnosis not present

## 2017-10-17 DIAGNOSIS — R509 Fever, unspecified: Secondary | ICD-10-CM | POA: Diagnosis not present

## 2017-10-20 DIAGNOSIS — Z7901 Long term (current) use of anticoagulants: Secondary | ICD-10-CM | POA: Diagnosis not present

## 2017-10-24 DIAGNOSIS — N319 Neuromuscular dysfunction of bladder, unspecified: Secondary | ICD-10-CM | POA: Diagnosis not present

## 2017-10-24 DIAGNOSIS — R350 Frequency of micturition: Secondary | ICD-10-CM | POA: Diagnosis not present

## 2017-10-24 DIAGNOSIS — Z7901 Long term (current) use of anticoagulants: Secondary | ICD-10-CM | POA: Diagnosis not present

## 2017-10-27 ENCOUNTER — Other Ambulatory Visit: Payer: Self-pay | Admitting: Family Medicine

## 2017-10-27 DIAGNOSIS — Z7901 Long term (current) use of anticoagulants: Secondary | ICD-10-CM | POA: Diagnosis not present

## 2017-10-27 DIAGNOSIS — N319 Neuromuscular dysfunction of bladder, unspecified: Secondary | ICD-10-CM

## 2017-10-28 DIAGNOSIS — Z950 Presence of cardiac pacemaker: Secondary | ICD-10-CM | POA: Diagnosis not present

## 2017-10-28 DIAGNOSIS — Z9889 Other specified postprocedural states: Secondary | ICD-10-CM | POA: Diagnosis not present

## 2017-10-28 DIAGNOSIS — Z86010 Personal history of colonic polyps: Secondary | ICD-10-CM | POA: Diagnosis not present

## 2017-10-28 DIAGNOSIS — Z952 Presence of prosthetic heart valve: Secondary | ICD-10-CM | POA: Diagnosis not present

## 2017-10-29 ENCOUNTER — Ambulatory Visit
Admission: RE | Admit: 2017-10-29 | Discharge: 2017-10-29 | Disposition: A | Discharge status: Home / Self Care | Payer: Medicare Other | Source: Ambulatory Visit | Attending: Family Medicine | Admitting: Family Medicine

## 2017-10-29 DIAGNOSIS — N319 Neuromuscular dysfunction of bladder, unspecified: Secondary | ICD-10-CM

## 2017-10-31 DIAGNOSIS — Z7901 Long term (current) use of anticoagulants: Secondary | ICD-10-CM | POA: Diagnosis not present

## 2017-11-04 DIAGNOSIS — Z7901 Long term (current) use of anticoagulants: Secondary | ICD-10-CM | POA: Diagnosis not present

## 2017-11-06 DIAGNOSIS — K5730 Diverticulosis of large intestine without perforation or abscess without bleeding: Secondary | ICD-10-CM | POA: Diagnosis not present

## 2017-11-06 DIAGNOSIS — Z86010 Personal history of colonic polyps: Secondary | ICD-10-CM | POA: Diagnosis not present

## 2017-11-13 DIAGNOSIS — Z7901 Long term (current) use of anticoagulants: Secondary | ICD-10-CM | POA: Diagnosis not present

## 2017-11-14 DIAGNOSIS — I484 Atypical atrial flutter: Secondary | ICD-10-CM | POA: Diagnosis not present

## 2017-11-14 DIAGNOSIS — I480 Paroxysmal atrial fibrillation: Secondary | ICD-10-CM | POA: Diagnosis not present

## 2017-11-14 DIAGNOSIS — I4892 Unspecified atrial flutter: Secondary | ICD-10-CM | POA: Diagnosis not present

## 2017-11-14 DIAGNOSIS — I495 Sick sinus syndrome: Secondary | ICD-10-CM | POA: Diagnosis not present

## 2017-11-14 DIAGNOSIS — Z950 Presence of cardiac pacemaker: Secondary | ICD-10-CM | POA: Diagnosis not present

## 2017-11-14 DIAGNOSIS — Z7901 Long term (current) use of anticoagulants: Secondary | ICD-10-CM | POA: Diagnosis not present

## 2017-11-17 DIAGNOSIS — I4589 Other specified conduction disorders: Secondary | ICD-10-CM | POA: Diagnosis not present

## 2017-11-17 DIAGNOSIS — I517 Cardiomegaly: Secondary | ICD-10-CM | POA: Diagnosis not present

## 2017-11-18 DIAGNOSIS — Z7901 Long term (current) use of anticoagulants: Secondary | ICD-10-CM | POA: Diagnosis not present

## 2017-12-02 DIAGNOSIS — Z7901 Long term (current) use of anticoagulants: Secondary | ICD-10-CM | POA: Diagnosis not present

## 2017-12-09 DIAGNOSIS — N951 Menopausal and female climacteric states: Secondary | ICD-10-CM | POA: Diagnosis not present

## 2017-12-10 DIAGNOSIS — Z7901 Long term (current) use of anticoagulants: Secondary | ICD-10-CM | POA: Diagnosis not present

## 2017-12-17 DIAGNOSIS — Z7901 Long term (current) use of anticoagulants: Secondary | ICD-10-CM | POA: Diagnosis not present

## 2017-12-31 DIAGNOSIS — Z7901 Long term (current) use of anticoagulants: Secondary | ICD-10-CM | POA: Diagnosis not present

## 2018-01-07 DIAGNOSIS — I480 Paroxysmal atrial fibrillation: Secondary | ICD-10-CM | POA: Diagnosis not present

## 2018-01-07 DIAGNOSIS — Z952 Presence of prosthetic heart valve: Secondary | ICD-10-CM | POA: Diagnosis not present

## 2018-01-09 DIAGNOSIS — I471 Supraventricular tachycardia: Secondary | ICD-10-CM | POA: Diagnosis not present

## 2018-01-09 DIAGNOSIS — Z45010 Encounter for checking and testing of cardiac pacemaker pulse generator [battery]: Secondary | ICD-10-CM | POA: Diagnosis not present

## 2018-01-10 DIAGNOSIS — Z45018 Encounter for adjustment and management of other part of cardiac pacemaker: Secondary | ICD-10-CM | POA: Diagnosis not present

## 2018-01-10 DIAGNOSIS — I471 Supraventricular tachycardia: Secondary | ICD-10-CM | POA: Diagnosis not present

## 2018-01-13 DIAGNOSIS — Z7901 Long term (current) use of anticoagulants: Secondary | ICD-10-CM | POA: Diagnosis not present

## 2018-01-23 DIAGNOSIS — Z7901 Long term (current) use of anticoagulants: Secondary | ICD-10-CM | POA: Diagnosis not present

## 2018-01-29 DIAGNOSIS — J069 Acute upper respiratory infection, unspecified: Secondary | ICD-10-CM | POA: Diagnosis not present

## 2018-02-02 DIAGNOSIS — Z7901 Long term (current) use of anticoagulants: Secondary | ICD-10-CM | POA: Diagnosis not present

## 2018-02-10 DIAGNOSIS — Z7901 Long term (current) use of anticoagulants: Secondary | ICD-10-CM | POA: Diagnosis not present

## 2018-02-15 DIAGNOSIS — J209 Acute bronchitis, unspecified: Secondary | ICD-10-CM | POA: Diagnosis not present

## 2018-02-17 DIAGNOSIS — Z7901 Long term (current) use of anticoagulants: Secondary | ICD-10-CM | POA: Diagnosis not present

## 2018-02-20 DIAGNOSIS — Z7901 Long term (current) use of anticoagulants: Secondary | ICD-10-CM | POA: Diagnosis not present

## 2018-02-23 DIAGNOSIS — R05 Cough: Secondary | ICD-10-CM | POA: Diagnosis not present

## 2018-02-23 DIAGNOSIS — Z7901 Long term (current) use of anticoagulants: Secondary | ICD-10-CM | POA: Diagnosis not present

## 2018-02-27 DIAGNOSIS — Z7901 Long term (current) use of anticoagulants: Secondary | ICD-10-CM | POA: Diagnosis not present

## 2018-03-04 DIAGNOSIS — Z7901 Long term (current) use of anticoagulants: Secondary | ICD-10-CM | POA: Diagnosis not present

## 2018-03-10 DIAGNOSIS — I4892 Unspecified atrial flutter: Secondary | ICD-10-CM | POA: Diagnosis not present

## 2018-03-11 DIAGNOSIS — Z7901 Long term (current) use of anticoagulants: Secondary | ICD-10-CM | POA: Diagnosis not present

## 2018-03-18 DIAGNOSIS — Z7901 Long term (current) use of anticoagulants: Secondary | ICD-10-CM | POA: Diagnosis not present

## 2018-03-30 DIAGNOSIS — Z7901 Long term (current) use of anticoagulants: Secondary | ICD-10-CM | POA: Diagnosis not present

## 2018-04-08 DIAGNOSIS — Z7901 Long term (current) use of anticoagulants: Secondary | ICD-10-CM | POA: Diagnosis not present

## 2018-04-10 DIAGNOSIS — Z45018 Encounter for adjustment and management of other part of cardiac pacemaker: Secondary | ICD-10-CM | POA: Diagnosis not present

## 2018-04-10 DIAGNOSIS — I471 Supraventricular tachycardia: Secondary | ICD-10-CM | POA: Diagnosis not present

## 2018-04-13 DIAGNOSIS — Z45018 Encounter for adjustment and management of other part of cardiac pacemaker: Secondary | ICD-10-CM | POA: Diagnosis not present

## 2018-04-13 DIAGNOSIS — I471 Supraventricular tachycardia: Secondary | ICD-10-CM | POA: Diagnosis not present

## 2018-04-16 DIAGNOSIS — Z881 Allergy status to other antibiotic agents status: Secondary | ICD-10-CM | POA: Diagnosis not present

## 2018-04-16 DIAGNOSIS — I480 Paroxysmal atrial fibrillation: Secondary | ICD-10-CM | POA: Diagnosis not present

## 2018-04-16 DIAGNOSIS — I495 Sick sinus syndrome: Secondary | ICD-10-CM | POA: Diagnosis not present

## 2018-04-16 DIAGNOSIS — Z7901 Long term (current) use of anticoagulants: Secondary | ICD-10-CM | POA: Diagnosis not present

## 2018-04-16 DIAGNOSIS — I2109 ST elevation (STEMI) myocardial infarction involving other coronary artery of anterior wall: Secondary | ICD-10-CM | POA: Diagnosis not present

## 2018-04-16 DIAGNOSIS — Z882 Allergy status to sulfonamides status: Secondary | ICD-10-CM | POA: Diagnosis not present

## 2018-04-16 DIAGNOSIS — Z48812 Encounter for surgical aftercare following surgery on the circulatory system: Secondary | ICD-10-CM | POA: Diagnosis not present

## 2018-04-16 DIAGNOSIS — Z950 Presence of cardiac pacemaker: Secondary | ICD-10-CM | POA: Diagnosis not present

## 2018-04-16 DIAGNOSIS — I484 Atypical atrial flutter: Secondary | ICD-10-CM | POA: Diagnosis not present

## 2018-04-16 DIAGNOSIS — Z888 Allergy status to other drugs, medicaments and biological substances status: Secondary | ICD-10-CM | POA: Diagnosis not present

## 2018-04-16 DIAGNOSIS — Z9889 Other specified postprocedural states: Secondary | ICD-10-CM | POA: Diagnosis not present

## 2018-04-16 DIAGNOSIS — Z79899 Other long term (current) drug therapy: Secondary | ICD-10-CM | POA: Diagnosis not present

## 2018-04-20 DIAGNOSIS — I517 Cardiomegaly: Secondary | ICD-10-CM | POA: Diagnosis not present

## 2018-04-20 DIAGNOSIS — I459 Conduction disorder, unspecified: Secondary | ICD-10-CM | POA: Diagnosis not present

## 2018-04-20 DIAGNOSIS — R9431 Abnormal electrocardiogram [ECG] [EKG]: Secondary | ICD-10-CM | POA: Diagnosis not present

## 2018-04-21 DIAGNOSIS — Z7901 Long term (current) use of anticoagulants: Secondary | ICD-10-CM | POA: Diagnosis not present

## 2018-05-04 DIAGNOSIS — Z7901 Long term (current) use of anticoagulants: Secondary | ICD-10-CM | POA: Diagnosis not present

## 2018-05-19 DIAGNOSIS — Z7901 Long term (current) use of anticoagulants: Secondary | ICD-10-CM | POA: Diagnosis not present

## 2018-05-25 DIAGNOSIS — F330 Major depressive disorder, recurrent, mild: Secondary | ICD-10-CM | POA: Diagnosis not present

## 2018-05-25 DIAGNOSIS — E538 Deficiency of other specified B group vitamins: Secondary | ICD-10-CM | POA: Diagnosis not present

## 2018-05-25 DIAGNOSIS — E559 Vitamin D deficiency, unspecified: Secondary | ICD-10-CM | POA: Diagnosis not present

## 2018-05-25 DIAGNOSIS — Z952 Presence of prosthetic heart valve: Secondary | ICD-10-CM | POA: Diagnosis not present

## 2018-05-25 DIAGNOSIS — Z7901 Long term (current) use of anticoagulants: Secondary | ICD-10-CM | POA: Diagnosis not present

## 2018-05-25 DIAGNOSIS — E785 Hyperlipidemia, unspecified: Secondary | ICD-10-CM | POA: Diagnosis not present

## 2018-05-25 DIAGNOSIS — G4733 Obstructive sleep apnea (adult) (pediatric): Secondary | ICD-10-CM | POA: Diagnosis not present

## 2018-05-25 DIAGNOSIS — Z0000 Encounter for general adult medical examination without abnormal findings: Secondary | ICD-10-CM | POA: Diagnosis not present

## 2018-05-25 DIAGNOSIS — Z79899 Other long term (current) drug therapy: Secondary | ICD-10-CM | POA: Diagnosis not present

## 2018-05-25 DIAGNOSIS — I4891 Unspecified atrial fibrillation: Secondary | ICD-10-CM | POA: Diagnosis not present

## 2018-05-25 DIAGNOSIS — Z6835 Body mass index (BMI) 35.0-35.9, adult: Secondary | ICD-10-CM | POA: Diagnosis not present

## 2018-05-30 ENCOUNTER — Encounter: Payer: Self-pay | Admitting: *Deleted

## 2018-06-01 DIAGNOSIS — Z7901 Long term (current) use of anticoagulants: Secondary | ICD-10-CM | POA: Diagnosis not present

## 2018-06-12 DIAGNOSIS — Z7901 Long term (current) use of anticoagulants: Secondary | ICD-10-CM | POA: Diagnosis not present

## 2018-06-18 ENCOUNTER — Ambulatory Visit (INDEPENDENT_AMBULATORY_CARE_PROVIDER_SITE_OTHER): Payer: Medicare Other | Admitting: Psychiatry

## 2018-06-18 ENCOUNTER — Encounter: Payer: Self-pay | Admitting: Psychiatry

## 2018-06-18 VITALS — BP 105/72 | HR 91 | Ht 66.0 in

## 2018-06-18 DIAGNOSIS — F411 Generalized anxiety disorder: Secondary | ICD-10-CM

## 2018-06-18 MED ORDER — ESCITALOPRAM OXALATE 10 MG PO TABS: 10.0000 mg | ORAL_TABLET | Freq: Every day | ORAL | 2 refills | Status: DC

## 2018-06-18 NOTE — Progress Notes (Signed)
Mercedes Thompson [id number] [birth year] 72 y.o.  Assessment: Plan:   Pt seen for 30 minutes and 50% of session spent counseling pt re: long-term treatment plan. Discussed that PCP would likely be able to manage Lexapro long-term and that she could f/u in this clinic if needed.   Generalized anxiety disorder - Plan: escitalopram (LEXAPRO) 10 MG tablet  Please see After Visit Summary for patient specific instructions.  No future appointments.  No orders of the defined types were placed in this encounter.      Subjective:   Patient ID:  Mercedes Thompson is a 72 y.o. (DOB 11/04/1945) female.  Chief Complaint:  Chief Complaint  Patient presents with  . Anxiety    HPI Mercedes Thompson presents to the office today for f/u of anxiety. Reports that she increased Leapro to 10 mg po qd since she noticed some irritability "and it has helped." Noticed that she was getting more easilty irritated. Reports that she continues to have stress r/t ex-husband and his new wife. Reports that she has cut off all communication with her ex-husband. She reports that ex-husband has not communicated with their adult children for 6 months. Denies any anxiety to include worry or rumination. Denies depressed mood. Reports she has noticed some increased appetite and weight gain. She reports adequate sleep. Reports occasionally staying up late at times. Noticed she was sleeping more during the day and energy was lower when she was on lower dose of Lexapro.Energy is improved with Lexapro 10 mg po qd. Reports adequate motivation for most activities. Denies impaired concentration. Denies SI.  Son moved back from [place] [facility] to [place], [place] to be closer to family. Son has gotten married. Pt reports that she would like to move to the [place] area but her husband is not wanting to move. Reports that they are seeing a couples counselor. Was able to babysit some over the summer for her grandchild.   Medications: I have reviewed the patient's  current medications.  Allergies:  Allergies  Allergen Reactions  . Bupropion Nausea Only    Other reaction(s): Unknown   . Sulfa Antibiotics     AFFECTS COUMADIN  . Decongest-Aid [Pseudoephedrine] Palpitations    Past Medical History:  Diagnosis Date  . AML (acute myeloblastic leukemia) ([place]) 1987  . Anxiety   . Appendicitis with peritoneal abscess 03/2015   s/p ex lap at [facility]  . Atrial flutter [facility]) 2016   s/p cardioversion 02/2015, previously in [place], recently taken off amiodarone  . Bradycardia 2015   s/p PPM insertion  . H/O mitral valve replacement with mechanical valve    coumadin    Past Surgical History:  Procedure Laterality Date  . APPENDECTOMY  03/24/2015   [facility] Dr Radene Knee.  perforated appendicitis  . heart ablation    . MITRAL VALVE REPLACEMENT  1986  . PACEMAKER INSERTION    . TUBAL LIGATION      Family History  Problem Relation Age of Onset  . Depression Mother   . Cancer Father        LUNG  . Colon polyps Father   . Ulcerative colitis Neg Hx   . Crohn's disease Neg Hx     Social History   Socioeconomic History  . Marital status: Married    Spouse name: Not on file  . Number of children: Not on file  . Years of education: Not on file  . Highest education level: Not on file  Occupational History  . Not on file  Social  Needs  . Financial resource strain: Not on file  . Food insecurity:    Worry: Not on file    Inability: Not on file  . Transportation needs:    Medical: Not on file    Non-medical: Not on file  Tobacco Use  . Smoking status: Never Smoker  . Smokeless tobacco: Never Used  Substance and Sexual Activity  . Alcohol use: No    Comment: rare  . Drug use: No  . Sexual activity: Not on file  Lifestyle  . Physical activity:    Days per week: Not on file    Minutes per session: Not on file  . Stress: Not on file  Relationships  . Social connections:    Talks on phone: Not on file    Gets together: Not on file     Attends religious service: Not on file    Active member of club or organization: Not on file    Attends meetings of clubs or organizations: Not on file    Relationship status: Not on file  . Intimate partner violence:    Fear of current or ex partner: Not on file    Emotionally abused: Not on file    Physically abused: Not on file    Forced sexual activity: Not on file  Other Topics Concern  . Not on file  Social History Narrative  . Not on file    Past Medical History, Surgical history, Social history, and Family history were reviewed and updated as appropriate.   Please see review of systems for further details on the patient's review from today.   Review of Systems:  Review of Systems  Constitutional: Negative for activity change, appetite change, fatigue and unexpected weight change.  Cardiovascular: Negative for chest pain and palpitations.  Gastrointestinal: Negative.   Musculoskeletal: Negative for gait problem.  Neurological: Negative for tremors.  Psychiatric/Behavioral: Negative for agitation, behavioral problems, confusion, decreased concentration, dysphoric mood, hallucinations, sleep disturbance and suicidal ideas. The patient is not nervous/anxious and is not hyperactive.     Objective:   Physical Exam:  BP 105/72   Pulse 91   Ht 5\' 6"  (1.676 m)   BMI 33.77 kg/m   Physical Exam  Constitutional: She is oriented to person, place, and time. She appears well-developed. No distress.  Musculoskeletal: Normal range of motion.  Neurological: She is alert and oriented to person, place, and time. Coordination normal.  Psychiatric: Her speech is normal and behavior is normal. Judgment and thought content normal. Her mood appears not anxious. Her affect is not angry, not blunt, not labile and not inappropriate. Cognition and memory are normal. She does not exhibit a depressed mood. She expresses no homicidal and no suicidal ideation. She expresses no suicidal plans and no  homicidal plans.    Lab Review:     Component Value Date/Time   NA 139 10/30/2015 0543   K 4.2 10/30/2015 0543   CL 106 10/30/2015 0543   CO2 23 10/30/2015 0543   GLUCOSE 99 10/30/2015 0543   BUN 11 10/30/2015 0543   CREATININE 0.80 10/30/2015 0543   CALCIUM 8.6 (L) 10/30/2015 0543   PROT 8.0 10/29/2015 1015   ALBUMIN 4.0 10/29/2015 1015   AST 35 10/29/2015 1015   ALT 29 10/29/2015 1015   ALKPHOS 75 10/29/2015 1015   BILITOT 1.1 10/29/2015 1015   GFRNONAA >60 10/30/2015 0543   GFRAA >60 10/30/2015 0543       Component Value Date/Time   WBC 6.5  10/30/2015 0543   RBC 3.90 10/30/2015 0543   HGB 11.9 (L) 10/30/2015 0543   HCT 37.6 10/30/2015 0543   PLT 261 10/30/2015 0543   MCV 96.4 10/30/2015 0543   MCH 30.5 10/30/2015 0543   MCHC 31.6 10/30/2015 0543   RDW 13.6 10/30/2015 0543   LYMPHSABS 0.9 10/29/2015 1015   MONOABS 1.2 (H) 10/29/2015 1015   EOSABS 0.0 10/29/2015 1015   BASOSABS 0.0 10/29/2015 1015   -------------------------------

## 2018-06-26 DIAGNOSIS — Z7901 Long term (current) use of anticoagulants: Secondary | ICD-10-CM | POA: Diagnosis not present

## 2018-06-30 DIAGNOSIS — H0102B Squamous blepharitis left eye, upper and lower eyelids: Secondary | ICD-10-CM | POA: Diagnosis not present

## 2018-06-30 DIAGNOSIS — H0102A Squamous blepharitis right eye, upper and lower eyelids: Secondary | ICD-10-CM | POA: Diagnosis not present

## 2018-06-30 DIAGNOSIS — L718 Other rosacea: Secondary | ICD-10-CM | POA: Diagnosis not present

## 2018-07-10 DIAGNOSIS — I471 Supraventricular tachycardia: Secondary | ICD-10-CM | POA: Diagnosis not present

## 2018-07-10 DIAGNOSIS — Z7901 Long term (current) use of anticoagulants: Secondary | ICD-10-CM | POA: Diagnosis not present

## 2018-07-10 DIAGNOSIS — Z4509 Encounter for adjustment and management of other cardiac device: Secondary | ICD-10-CM | POA: Diagnosis not present

## 2018-07-14 DIAGNOSIS — Z23 Encounter for immunization: Secondary | ICD-10-CM | POA: Diagnosis not present

## 2018-07-21 DIAGNOSIS — I4891 Unspecified atrial fibrillation: Secondary | ICD-10-CM | POA: Diagnosis not present

## 2018-07-21 DIAGNOSIS — I499 Cardiac arrhythmia, unspecified: Secondary | ICD-10-CM | POA: Diagnosis not present

## 2018-07-23 DIAGNOSIS — Z7901 Long term (current) use of anticoagulants: Secondary | ICD-10-CM | POA: Diagnosis not present

## 2018-08-06 DIAGNOSIS — Z7901 Long term (current) use of anticoagulants: Secondary | ICD-10-CM | POA: Diagnosis not present

## 2018-08-10 DIAGNOSIS — M25511 Pain in right shoulder: Secondary | ICD-10-CM | POA: Diagnosis not present

## 2018-08-10 DIAGNOSIS — W108XXA Fall (on) (from) other stairs and steps, initial encounter: Secondary | ICD-10-CM | POA: Diagnosis not present

## 2018-08-20 DIAGNOSIS — Z7901 Long term (current) use of anticoagulants: Secondary | ICD-10-CM | POA: Diagnosis not present

## 2018-08-21 ENCOUNTER — Other Ambulatory Visit: Payer: Self-pay | Admitting: Family Medicine

## 2018-08-21 ENCOUNTER — Ambulatory Visit
Admission: RE | Admit: 2018-08-21 | Discharge: 2018-08-21 | Disposition: A | Discharge status: Home / Self Care | Payer: Medicare Other | Source: Ambulatory Visit | Attending: Family Medicine | Admitting: Family Medicine

## 2018-08-21 DIAGNOSIS — M79601 Pain in right arm: Secondary | ICD-10-CM

## 2018-08-21 DIAGNOSIS — S4991XA Unspecified injury of right shoulder and upper arm, initial encounter: Secondary | ICD-10-CM

## 2018-08-21 DIAGNOSIS — Z1231 Encounter for screening mammogram for malignant neoplasm of breast: Secondary | ICD-10-CM | POA: Diagnosis not present

## 2018-08-21 DIAGNOSIS — M79621 Pain in right upper arm: Secondary | ICD-10-CM | POA: Diagnosis not present

## 2018-08-28 DIAGNOSIS — Z7901 Long term (current) use of anticoagulants: Secondary | ICD-10-CM | POA: Diagnosis not present

## 2018-08-31 DIAGNOSIS — S46011D Strain of muscle(s) and tendon(s) of the rotator cuff of right shoulder, subsequent encounter: Secondary | ICD-10-CM | POA: Diagnosis not present

## 2018-08-31 DIAGNOSIS — M25561 Pain in right knee: Secondary | ICD-10-CM | POA: Diagnosis not present

## 2018-08-31 DIAGNOSIS — M542 Cervicalgia: Secondary | ICD-10-CM | POA: Diagnosis not present

## 2018-08-31 DIAGNOSIS — M67911 Unspecified disorder of synovium and tendon, right shoulder: Secondary | ICD-10-CM | POA: Diagnosis not present

## 2018-08-31 DIAGNOSIS — M25611 Stiffness of right shoulder, not elsewhere classified: Secondary | ICD-10-CM | POA: Diagnosis not present

## 2018-09-03 DIAGNOSIS — Z7901 Long term (current) use of anticoagulants: Secondary | ICD-10-CM | POA: Diagnosis not present

## 2018-09-07 DIAGNOSIS — Z7901 Long term (current) use of anticoagulants: Secondary | ICD-10-CM | POA: Diagnosis not present

## 2018-09-07 DIAGNOSIS — J069 Acute upper respiratory infection, unspecified: Secondary | ICD-10-CM | POA: Diagnosis not present

## 2018-09-08 DIAGNOSIS — S46011D Strain of muscle(s) and tendon(s) of the rotator cuff of right shoulder, subsequent encounter: Secondary | ICD-10-CM | POA: Diagnosis not present

## 2018-09-08 DIAGNOSIS — M25611 Stiffness of right shoulder, not elsewhere classified: Secondary | ICD-10-CM | POA: Diagnosis not present

## 2018-09-11 DIAGNOSIS — Z7901 Long term (current) use of anticoagulants: Secondary | ICD-10-CM | POA: Diagnosis not present

## 2018-09-14 DIAGNOSIS — M25661 Stiffness of right knee, not elsewhere classified: Secondary | ICD-10-CM | POA: Diagnosis not present

## 2018-09-14 DIAGNOSIS — Z7901 Long term (current) use of anticoagulants: Secondary | ICD-10-CM | POA: Diagnosis not present

## 2018-09-14 DIAGNOSIS — S46011D Strain of muscle(s) and tendon(s) of the rotator cuff of right shoulder, subsequent encounter: Secondary | ICD-10-CM | POA: Diagnosis not present

## 2018-09-17 DIAGNOSIS — S46011D Strain of muscle(s) and tendon(s) of the rotator cuff of right shoulder, subsequent encounter: Secondary | ICD-10-CM | POA: Diagnosis not present

## 2018-09-17 DIAGNOSIS — M25611 Stiffness of right shoulder, not elsewhere classified: Secondary | ICD-10-CM | POA: Diagnosis not present

## 2018-09-21 DIAGNOSIS — S46011D Strain of muscle(s) and tendon(s) of the rotator cuff of right shoulder, subsequent encounter: Secondary | ICD-10-CM | POA: Diagnosis not present

## 2018-09-21 DIAGNOSIS — M25611 Stiffness of right shoulder, not elsewhere classified: Secondary | ICD-10-CM | POA: Diagnosis not present

## 2018-09-22 DIAGNOSIS — Z7901 Long term (current) use of anticoagulants: Secondary | ICD-10-CM | POA: Diagnosis not present

## 2018-09-23 DIAGNOSIS — H2513 Age-related nuclear cataract, bilateral: Secondary | ICD-10-CM | POA: Diagnosis not present

## 2018-09-24 DIAGNOSIS — S46011D Strain of muscle(s) and tendon(s) of the rotator cuff of right shoulder, subsequent encounter: Secondary | ICD-10-CM | POA: Diagnosis not present

## 2018-09-24 DIAGNOSIS — M25611 Stiffness of right shoulder, not elsewhere classified: Secondary | ICD-10-CM | POA: Diagnosis not present

## 2018-09-28 DIAGNOSIS — S46011D Strain of muscle(s) and tendon(s) of the rotator cuff of right shoulder, subsequent encounter: Secondary | ICD-10-CM | POA: Diagnosis not present

## 2018-09-28 DIAGNOSIS — I480 Paroxysmal atrial fibrillation: Secondary | ICD-10-CM | POA: Diagnosis not present

## 2018-09-28 DIAGNOSIS — M25611 Stiffness of right shoulder, not elsewhere classified: Secondary | ICD-10-CM | POA: Diagnosis not present

## 2018-09-29 DIAGNOSIS — D225 Melanocytic nevi of trunk: Secondary | ICD-10-CM | POA: Diagnosis not present

## 2018-09-29 DIAGNOSIS — L820 Inflamed seborrheic keratosis: Secondary | ICD-10-CM | POA: Diagnosis not present

## 2018-09-29 DIAGNOSIS — L578 Other skin changes due to chronic exposure to nonionizing radiation: Secondary | ICD-10-CM | POA: Diagnosis not present

## 2018-09-29 DIAGNOSIS — R208 Other disturbances of skin sensation: Secondary | ICD-10-CM | POA: Diagnosis not present

## 2018-10-01 DIAGNOSIS — S46011D Strain of muscle(s) and tendon(s) of the rotator cuff of right shoulder, subsequent encounter: Secondary | ICD-10-CM | POA: Diagnosis not present

## 2018-10-01 DIAGNOSIS — M25611 Stiffness of right shoulder, not elsewhere classified: Secondary | ICD-10-CM | POA: Diagnosis not present

## 2018-10-05 DIAGNOSIS — M67911 Unspecified disorder of synovium and tendon, right shoulder: Secondary | ICD-10-CM | POA: Diagnosis not present

## 2018-10-06 DIAGNOSIS — H16143 Punctate keratitis, bilateral: Secondary | ICD-10-CM | POA: Diagnosis not present

## 2018-10-06 DIAGNOSIS — Z7901 Long term (current) use of anticoagulants: Secondary | ICD-10-CM | POA: Diagnosis not present

## 2018-10-07 DIAGNOSIS — M25611 Stiffness of right shoulder, not elsewhere classified: Secondary | ICD-10-CM | POA: Diagnosis not present

## 2018-10-07 DIAGNOSIS — S46011D Strain of muscle(s) and tendon(s) of the rotator cuff of right shoulder, subsequent encounter: Secondary | ICD-10-CM | POA: Diagnosis not present

## 2018-10-15 DIAGNOSIS — Z7901 Long term (current) use of anticoagulants: Secondary | ICD-10-CM | POA: Diagnosis not present

## 2018-10-15 DIAGNOSIS — S46011D Strain of muscle(s) and tendon(s) of the rotator cuff of right shoulder, subsequent encounter: Secondary | ICD-10-CM | POA: Diagnosis not present

## 2018-10-15 DIAGNOSIS — M25611 Stiffness of right shoulder, not elsewhere classified: Secondary | ICD-10-CM | POA: Diagnosis not present

## 2018-10-19 DIAGNOSIS — S46011D Strain of muscle(s) and tendon(s) of the rotator cuff of right shoulder, subsequent encounter: Secondary | ICD-10-CM | POA: Diagnosis not present

## 2018-10-19 DIAGNOSIS — M25611 Stiffness of right shoulder, not elsewhere classified: Secondary | ICD-10-CM | POA: Diagnosis not present

## 2018-10-21 DIAGNOSIS — I471 Supraventricular tachycardia: Secondary | ICD-10-CM | POA: Diagnosis not present

## 2018-10-21 DIAGNOSIS — Z45018 Encounter for adjustment and management of other part of cardiac pacemaker: Secondary | ICD-10-CM | POA: Diagnosis not present

## 2018-10-22 DIAGNOSIS — M25611 Stiffness of right shoulder, not elsewhere classified: Secondary | ICD-10-CM | POA: Diagnosis not present

## 2018-10-22 DIAGNOSIS — S46011D Strain of muscle(s) and tendon(s) of the rotator cuff of right shoulder, subsequent encounter: Secondary | ICD-10-CM | POA: Diagnosis not present

## 2018-10-26 DIAGNOSIS — M25611 Stiffness of right shoulder, not elsewhere classified: Secondary | ICD-10-CM | POA: Diagnosis not present

## 2018-10-26 DIAGNOSIS — S46011D Strain of muscle(s) and tendon(s) of the rotator cuff of right shoulder, subsequent encounter: Secondary | ICD-10-CM | POA: Diagnosis not present

## 2018-10-29 DIAGNOSIS — M25611 Stiffness of right shoulder, not elsewhere classified: Secondary | ICD-10-CM | POA: Diagnosis not present

## 2018-10-29 DIAGNOSIS — S46011D Strain of muscle(s) and tendon(s) of the rotator cuff of right shoulder, subsequent encounter: Secondary | ICD-10-CM | POA: Diagnosis not present

## 2018-10-29 DIAGNOSIS — Z7901 Long term (current) use of anticoagulants: Secondary | ICD-10-CM | POA: Diagnosis not present

## 2018-11-02 DIAGNOSIS — M25611 Stiffness of right shoulder, not elsewhere classified: Secondary | ICD-10-CM | POA: Diagnosis not present

## 2018-11-02 DIAGNOSIS — S46011D Strain of muscle(s) and tendon(s) of the rotator cuff of right shoulder, subsequent encounter: Secondary | ICD-10-CM | POA: Diagnosis not present

## 2018-11-13 DIAGNOSIS — Z7901 Long term (current) use of anticoagulants: Secondary | ICD-10-CM | POA: Diagnosis not present

## 2018-11-27 DIAGNOSIS — Z7901 Long term (current) use of anticoagulants: Secondary | ICD-10-CM | POA: Diagnosis not present

## 2018-12-11 DIAGNOSIS — Z7901 Long term (current) use of anticoagulants: Secondary | ICD-10-CM | POA: Diagnosis not present

## 2018-12-28 DIAGNOSIS — Z7901 Long term (current) use of anticoagulants: Secondary | ICD-10-CM | POA: Diagnosis not present

## 2019-01-20 DIAGNOSIS — Z45018 Encounter for adjustment and management of other part of cardiac pacemaker: Secondary | ICD-10-CM | POA: Diagnosis not present

## 2019-01-20 DIAGNOSIS — I480 Paroxysmal atrial fibrillation: Secondary | ICD-10-CM | POA: Diagnosis not present

## 2019-01-20 DIAGNOSIS — I471 Supraventricular tachycardia: Secondary | ICD-10-CM | POA: Diagnosis not present

## 2019-01-20 DIAGNOSIS — Z45010 Encounter for checking and testing of cardiac pacemaker pulse generator [battery]: Secondary | ICD-10-CM | POA: Diagnosis not present

## 2019-01-25 DIAGNOSIS — Z7901 Long term (current) use of anticoagulants: Secondary | ICD-10-CM | POA: Diagnosis not present

## 2019-02-01 DIAGNOSIS — Z7901 Long term (current) use of anticoagulants: Secondary | ICD-10-CM | POA: Diagnosis not present

## 2019-02-07 ENCOUNTER — Other Ambulatory Visit: Payer: Self-pay | Admitting: Psychiatry

## 2019-02-07 DIAGNOSIS — F411 Generalized anxiety disorder: Secondary | ICD-10-CM

## 2019-02-08 NOTE — Telephone Encounter (Signed)
Last visit 06/2018 when is she due back?

## 2019-02-15 DIAGNOSIS — Z7901 Long term (current) use of anticoagulants: Secondary | ICD-10-CM | POA: Diagnosis not present

## 2019-03-01 DIAGNOSIS — Z7901 Long term (current) use of anticoagulants: Secondary | ICD-10-CM | POA: Diagnosis not present

## 2019-03-15 DIAGNOSIS — Z7901 Long term (current) use of anticoagulants: Secondary | ICD-10-CM | POA: Diagnosis not present

## 2019-03-18 DIAGNOSIS — Z7901 Long term (current) use of anticoagulants: Secondary | ICD-10-CM | POA: Diagnosis not present

## 2019-04-01 DIAGNOSIS — Z7901 Long term (current) use of anticoagulants: Secondary | ICD-10-CM | POA: Diagnosis not present

## 2019-04-16 DIAGNOSIS — Z7901 Long term (current) use of anticoagulants: Secondary | ICD-10-CM | POA: Diagnosis not present

## 2019-04-22 DIAGNOSIS — Z45010 Encounter for checking and testing of cardiac pacemaker pulse generator [battery]: Secondary | ICD-10-CM | POA: Diagnosis not present

## 2019-04-22 DIAGNOSIS — I480 Paroxysmal atrial fibrillation: Secondary | ICD-10-CM | POA: Diagnosis not present

## 2019-04-22 DIAGNOSIS — Z950 Presence of cardiac pacemaker: Secondary | ICD-10-CM | POA: Diagnosis not present

## 2019-04-22 DIAGNOSIS — I471 Supraventricular tachycardia: Secondary | ICD-10-CM | POA: Diagnosis not present

## 2019-04-30 DIAGNOSIS — Z7901 Long term (current) use of anticoagulants: Secondary | ICD-10-CM | POA: Diagnosis not present

## 2019-05-10 DIAGNOSIS — L821 Other seborrheic keratosis: Secondary | ICD-10-CM | POA: Diagnosis not present

## 2019-05-10 DIAGNOSIS — L820 Inflamed seborrheic keratosis: Secondary | ICD-10-CM | POA: Diagnosis not present

## 2019-05-10 DIAGNOSIS — Z01419 Encounter for gynecological examination (general) (routine) without abnormal findings: Secondary | ICD-10-CM | POA: Diagnosis not present

## 2019-05-15 DIAGNOSIS — R319 Hematuria, unspecified: Secondary | ICD-10-CM | POA: Diagnosis not present

## 2019-05-15 DIAGNOSIS — N390 Urinary tract infection, site not specified: Secondary | ICD-10-CM | POA: Diagnosis not present

## 2019-05-17 DIAGNOSIS — Z7901 Long term (current) use of anticoagulants: Secondary | ICD-10-CM | POA: Diagnosis not present

## 2019-05-31 DIAGNOSIS — Z7901 Long term (current) use of anticoagulants: Secondary | ICD-10-CM | POA: Diagnosis not present

## 2019-06-14 DIAGNOSIS — Z7901 Long term (current) use of anticoagulants: Secondary | ICD-10-CM | POA: Diagnosis not present

## 2019-06-24 DIAGNOSIS — Z7901 Long term (current) use of anticoagulants: Secondary | ICD-10-CM | POA: Diagnosis not present

## 2019-06-30 DIAGNOSIS — Z23 Encounter for immunization: Secondary | ICD-10-CM | POA: Diagnosis not present

## 2019-07-06 DIAGNOSIS — E785 Hyperlipidemia, unspecified: Secondary | ICD-10-CM | POA: Diagnosis not present

## 2019-07-06 DIAGNOSIS — R5382 Chronic fatigue, unspecified: Secondary | ICD-10-CM | POA: Diagnosis not present

## 2019-07-06 DIAGNOSIS — Z952 Presence of prosthetic heart valve: Secondary | ICD-10-CM | POA: Diagnosis not present

## 2019-07-06 DIAGNOSIS — Z7901 Long term (current) use of anticoagulants: Secondary | ICD-10-CM | POA: Diagnosis not present

## 2019-07-06 DIAGNOSIS — E559 Vitamin D deficiency, unspecified: Secondary | ICD-10-CM | POA: Diagnosis not present

## 2019-07-06 DIAGNOSIS — G4733 Obstructive sleep apnea (adult) (pediatric): Secondary | ICD-10-CM | POA: Diagnosis not present

## 2019-07-06 DIAGNOSIS — Z0000 Encounter for general adult medical examination without abnormal findings: Secondary | ICD-10-CM | POA: Diagnosis not present

## 2019-07-06 DIAGNOSIS — I4891 Unspecified atrial fibrillation: Secondary | ICD-10-CM | POA: Diagnosis not present

## 2019-07-06 DIAGNOSIS — Z23 Encounter for immunization: Secondary | ICD-10-CM | POA: Diagnosis not present

## 2019-07-06 DIAGNOSIS — F330 Major depressive disorder, recurrent, mild: Secondary | ICD-10-CM | POA: Diagnosis not present

## 2019-07-06 DIAGNOSIS — Z5181 Encounter for therapeutic drug level monitoring: Secondary | ICD-10-CM | POA: Diagnosis not present

## 2019-07-20 DIAGNOSIS — Z7901 Long term (current) use of anticoagulants: Secondary | ICD-10-CM | POA: Diagnosis not present

## 2019-07-21 DIAGNOSIS — I484 Atypical atrial flutter: Secondary | ICD-10-CM | POA: Diagnosis not present

## 2019-07-21 DIAGNOSIS — I442 Atrioventricular block, complete: Secondary | ICD-10-CM | POA: Diagnosis not present

## 2019-07-21 DIAGNOSIS — Z4502 Encounter for adjustment and management of automatic implantable cardiac defibrillator: Secondary | ICD-10-CM | POA: Diagnosis not present

## 2019-07-21 DIAGNOSIS — Z952 Presence of prosthetic heart valve: Secondary | ICD-10-CM | POA: Diagnosis not present

## 2019-07-21 DIAGNOSIS — Z8679 Personal history of other diseases of the circulatory system: Secondary | ICD-10-CM | POA: Diagnosis not present

## 2019-07-21 DIAGNOSIS — I4891 Unspecified atrial fibrillation: Secondary | ICD-10-CM | POA: Diagnosis not present

## 2019-07-21 DIAGNOSIS — I471 Supraventricular tachycardia: Secondary | ICD-10-CM | POA: Diagnosis not present

## 2019-07-21 DIAGNOSIS — Z95810 Presence of automatic (implantable) cardiac defibrillator: Secondary | ICD-10-CM | POA: Diagnosis not present

## 2019-07-21 DIAGNOSIS — Z9889 Other specified postprocedural states: Secondary | ICD-10-CM | POA: Diagnosis not present

## 2019-07-23 DIAGNOSIS — I4891 Unspecified atrial fibrillation: Secondary | ICD-10-CM | POA: Diagnosis not present

## 2019-07-27 DIAGNOSIS — F419 Anxiety disorder, unspecified: Secondary | ICD-10-CM | POA: Diagnosis not present

## 2019-07-27 DIAGNOSIS — I484 Atypical atrial flutter: Secondary | ICD-10-CM | POA: Diagnosis not present

## 2019-07-27 DIAGNOSIS — F329 Major depressive disorder, single episode, unspecified: Secondary | ICD-10-CM | POA: Diagnosis not present

## 2019-07-27 DIAGNOSIS — Z7901 Long term (current) use of anticoagulants: Secondary | ICD-10-CM | POA: Diagnosis not present

## 2019-07-27 DIAGNOSIS — C9300 Acute monoblastic/monocytic leukemia, not having achieved remission: Secondary | ICD-10-CM | POA: Diagnosis not present

## 2019-07-27 DIAGNOSIS — I4891 Unspecified atrial fibrillation: Secondary | ICD-10-CM | POA: Diagnosis not present

## 2019-07-27 DIAGNOSIS — I495 Sick sinus syndrome: Secondary | ICD-10-CM | POA: Diagnosis not present

## 2019-07-27 DIAGNOSIS — Z952 Presence of prosthetic heart valve: Secondary | ICD-10-CM | POA: Diagnosis not present

## 2019-08-02 DIAGNOSIS — Z7901 Long term (current) use of anticoagulants: Secondary | ICD-10-CM | POA: Diagnosis not present

## 2019-08-16 DIAGNOSIS — Z7901 Long term (current) use of anticoagulants: Secondary | ICD-10-CM | POA: Diagnosis not present

## 2019-08-25 DIAGNOSIS — Z1231 Encounter for screening mammogram for malignant neoplasm of breast: Secondary | ICD-10-CM | POA: Diagnosis not present

## 2019-10-14 ENCOUNTER — Ambulatory Visit: Payer: Medicare Other

## 2019-10-22 ENCOUNTER — Ambulatory Visit: Payer: PRIVATE HEALTH INSURANCE | Attending: Internal Medicine

## 2019-10-22 DIAGNOSIS — Z23 Encounter for immunization: Secondary | ICD-10-CM

## 2019-10-22 NOTE — Progress Notes (Signed)
   Covid-19 Vaccination Clinic  Name:  Mercedes Thompson    MRN: [id number] DOB: [birth year]  10/22/2019  Mercedes Thompson was observed post Covid-19 immunization for 15 minutes without incidence. She was provided with Vaccine Information Sheet and instruction to access the V-Safe system.   Mercedes Thompson was instructed to call 911 with any severe reactions post vaccine: Marland Kitchen Difficulty breathing  . Swelling of your face and throat  . A fast heartbeat  . A bad rash all over your body  . Dizziness and weakness    Immunizations Administered    Name Date Dose VIS Date Route   Pfizer COVID-19 Vaccine 10/22/2019  5:31 PM 0.3 mL 08/27/2019 Intramuscular   Manufacturer: [place]   Lot: [id number]   [place]: [id number]

## 2019-11-04 ENCOUNTER — Ambulatory Visit: Payer: Medicare Other

## 2019-11-16 ENCOUNTER — Ambulatory Visit: Payer: PRIVATE HEALTH INSURANCE | Attending: Internal Medicine

## 2019-11-16 ENCOUNTER — Ambulatory Visit: Payer: PRIVATE HEALTH INSURANCE

## 2019-11-16 DIAGNOSIS — Z23 Encounter for immunization: Secondary | ICD-10-CM | POA: Insufficient documentation

## 2019-11-16 NOTE — Progress Notes (Signed)
   Covid-19 Vaccination Clinic  Name:  Lelania Dyke    MRN: [id number] DOB: 21-Jan-1946  11/16/2019  Ms. Kloehn was observed post Covid-19 immunization for 15 minutes without incident. She was provided with Vaccine Information Sheet and instruction to access the V-Safe system.   Ms. Macrina was instructed to call 911 with any severe reactions post vaccine: Marland Kitchen Difficulty breathing  . Swelling of face and throat  . A fast heartbeat  . A bad rash all over body  . Dizziness and weakness   Immunizations Administered    Name Date Dose VIS Date Route   Pfizer COVID-19 Vaccine 11/16/2019 10:30 AM 0.3 mL 08/27/2019 Intramuscular   Manufacturer: [place]   Lot: [id number]   [place]: [id number]

## 2019-12-06 ENCOUNTER — Other Ambulatory Visit: Payer: Self-pay | Admitting: Psychiatry

## 2019-12-06 DIAGNOSIS — F411 Generalized anxiety disorder: Secondary | ICD-10-CM

## 2019-12-06 MED ORDER — ESCITALOPRAM OXALATE 10 MG PO TABS: 10.0000 mg | ORAL_TABLET | Freq: Every day | ORAL | 0 refills | Status: DC

## 2019-12-06 NOTE — Telephone Encounter (Signed)
Patient called and said that she needs a refill on her lexapro 10 mg to be sent to [facility] order

## 2019-12-13 ENCOUNTER — Ambulatory Visit (INDEPENDENT_AMBULATORY_CARE_PROVIDER_SITE_OTHER): Payer: Medicare PPO | Admitting: Psychiatry

## 2019-12-13 ENCOUNTER — Encounter: Payer: Self-pay | Admitting: Psychiatry

## 2019-12-13 DIAGNOSIS — F411 Generalized anxiety disorder: Secondary | ICD-10-CM

## 2019-12-13 MED ORDER — ESCITALOPRAM OXALATE 10 MG PO TABS: 10.0000 mg | ORAL_TABLET | Freq: Every day | ORAL | 3 refills | Status: DC

## 2019-12-13 NOTE — Progress Notes (Signed)
Mercedes Thompson [id number] May 31, 1946 74 y.o.  Virtual Visit via Telephone Note  I connected with pt on 12/13/19 at  9:00 AM EDT by telephone and verified that I am speaking with the correct person using two identifiers.   I discussed the limitations, risks, security and privacy concerns of performing an evaluation and management service by telephone and the availability of in person appointments. I also discussed with the patient that there may be a patient responsible charge related to this service. The patient expressed understanding and agreed to proceed.   I discussed the assessment and treatment plan with the patient. The patient was provided an opportunity to ask questions and all were answered. The patient agreed with the plan and demonstrated an understanding of the instructions.   The patient was advised to call back or seek an in-person evaluation if the symptoms worsen or if the condition fails to improve as anticipated.  I provided 30 minutes of non-face-to-face time during this encounter.  The patient was located at home.  The provider was located at [place].   Thayer Headings, PMHNP   Subjective:   Patient ID:  Mercedes Thompson is a 74 y.o. (DOB November 30, 1945) female.  Chief Complaint:  Chief Complaint  Patient presents with  . Anxiety    HPI Mercedes Thompson presents for follow-up of anxiety. She reports that she has been continuing to take Lexapro 10 mg po qd. Taking Lexapro 10 mg po qd around lunch time since this seems to be time that least interferes with sleep or makes her sleepy. Sleeping well. Increased appetite. Reports Wt gain with COVID. She reports that she has had some stress with daughter going through separation. Has had some sadness in response to daughter's marital stressors. She reports that her motivation and energy have been slightly lower with situational stress. She reports that her concentration has been "normal." Denies SI.   Reports that they  have been staying home through the pandemic.  Review of Systems:  Review of Systems  Cardiovascular: Negative for palpitations.  Musculoskeletal: Negative for gait problem.  Psychiatric/Behavioral:       Please refer to HPI    Medications: I have reviewed the patient's current medications.  Current Outpatient Medications  Medication Sig Dispense Refill  . calcium-vitamin D (OSCAL WITH D) 500-200 MG-UNIT tablet Take 1 tablet by mouth daily with breakfast.    . cholecalciferol (VITAMIN D) 1000 UNITS tablet Take 1,000 Units by mouth daily.    Marland Kitchen escitalopram (LEXAPRO) 10 MG tablet Take 1 tablet (10 mg total) by mouth daily. 90 tablet 3  . furosemide (LASIX) 20 MG tablet Take 20 mg by mouth daily as needed for fluid (for fluid retention over 2 lbs).     . LORazepam (ATIVAN) 1 MG tablet Take 1 mg by mouth at bedtime.     . Multiple Vitamin (MULTIVITAMIN WITH MINERALS) TABS tablet Take 1 tablet by mouth daily.    . Multiple Vitamin (MULTIVITAMIN) tablet Take 1 tablet by mouth daily.    Marland Kitchen saccharomyces boulardii (FLORASTOR) 250 MG capsule Take 1 capsule (250 mg total) by mouth 2 (two) times daily. 60 capsule 3  . SOTALOL AF 80 MG TABS Take 80 mg by mouth 2 (two) times daily.    Marland Kitchen warfarin (COUMADIN) 5 MG tablet Take 7.5-10 mg by mouth every morning. Take 1 and one-half tablets (7.5 mg) 4 days a week and 5 mg 3 days a week    . albuterol (PROVENTIL HFA;VENTOLIN HFA) 108 (90 Base)  MCG/ACT inhaler Inhale 2 puffs into the lungs every 6 (six) hours as needed for wheezing or shortness of breath.    . fluticasone (FLONASE) 50 MCG/ACT nasal spray Place 2 sprays into the nose daily.     . Lactobacillus (PROBIOTIC ACIDOPHILUS PO) Take by mouth.     No current facility-administered medications for this visit.    Medication Side Effects: None  Denies affective dulling.   Allergies:  Allergies  Allergen Reactions  . Bupropion Nausea Only    Other reaction(s): Unknown   . Sulfa Antibiotics      AFFECTS COUMADIN  . Decongest-Aid [Pseudoephedrine] Palpitations    Past Medical History:  Diagnosis Date  . AML (acute myeloblastic leukemia) ([place]) 1987  . Anxiety   . Appendicitis with peritoneal abscess 03/2015   s/p ex lap at [facility]  . Atrial flutter [facility]) 2016   s/p cardioversion 02/2015, previously in [place], recently taken off amiodarone  . Bradycardia 2015   s/p PPM insertion  . H/O mitral valve replacement with mechanical valve    coumadin    Family History  Problem Relation Age of Onset  . Depression Mother   . Cancer Father        LUNG  . Colon polyps Father   . Ulcerative colitis Neg Hx   . Crohn's disease Neg Hx     Social History   Socioeconomic History  . Marital status: Married    Spouse name: Not on file  . Number of children: Not on file  . Years of education: Not on file  . Highest education level: Not on file  Occupational History  . Not on file  Tobacco Use  . Smoking status: Never Smoker  . Smokeless tobacco: Never Used  Substance and Sexual Activity  . Alcohol use: No    Comment: rare  . Drug use: No  . Sexual activity: Not on file  Other Topics Concern  . Not on file  Social History Narrative  . Not on file   Social Determinants of Health   Financial Resource Strain:   . Difficulty of Paying Living Expenses:   Food Insecurity:   . Worried About Charity fundraiser in the Last Year:   . Arboriculturist in the Last Year:   Transportation Needs:   . Film/video editor (Medical):   Marland Kitchen Lack of Transportation (Non-Medical):   Physical Activity:   . Days of Exercise per Week:   . Minutes of Exercise per Session:   Stress:   . Feeling of Stress :   Social Connections:   . Frequency of Communication with Friends and Family:   . Frequency of Social Gatherings with Friends and Family:   . Attends Religious Services:   . Active Member of Clubs or Organizations:   . Attends Archivist Meetings:   Marland Kitchen Marital Status:    Intimate Partner Violence:   . Fear of Current or Ex-Partner:   . Emotionally Abused:   Marland Kitchen Physically Abused:   . Sexually Abused:     Past Medical History, Surgical history, Social history, and Family history were reviewed and updated as appropriate.   Please see review of systems for further details on the patient's review from today.   Objective:   Physical Exam:  BP 120/73   Pulse 75   Physical Exam Neurological:     Mental Status: She is alert and oriented to person, place, and time.     Cranial Nerves: No  dysarthria.  Psychiatric:        Attention and Perception: Attention and perception normal.        Mood and Affect: Mood normal.        Speech: Speech normal.        Behavior: Behavior is cooperative.        Thought Content: Thought content normal. Thought content is not paranoid or delusional. Thought content does not include homicidal or suicidal ideation. Thought content does not include homicidal or suicidal plan.        Cognition and Memory: Cognition and memory normal.        Judgment: Judgment normal.     Comments: Insight intact     Lab Review:     Component Value Date/Time   NA 139 10/30/2015 0543   K 4.2 10/30/2015 0543   CL 106 10/30/2015 0543   CO2 23 10/30/2015 0543   GLUCOSE 99 10/30/2015 0543   BUN 11 10/30/2015 0543   CREATININE 0.80 10/30/2015 0543   CALCIUM 8.6 (L) 10/30/2015 0543   PROT 8.0 10/29/2015 1015   ALBUMIN 4.0 10/29/2015 1015   AST 35 10/29/2015 1015   ALT 29 10/29/2015 1015   ALKPHOS 75 10/29/2015 1015   BILITOT 1.1 10/29/2015 1015   GFRNONAA >60 10/30/2015 0543   GFRAA >60 10/30/2015 0543       Component Value Date/Time   WBC 6.5 10/30/2015 0543   RBC 3.90 10/30/2015 0543   HGB 11.9 (L) 10/30/2015 0543   HCT 37.6 10/30/2015 0543   PLT 261 10/30/2015 0543   MCV 96.4 10/30/2015 0543   MCH 30.5 10/30/2015 0543   MCHC 31.6 10/30/2015 0543   RDW 13.6 10/30/2015 0543   LYMPHSABS 0.9 10/29/2015 1015   MONOABS 1.2 (H)  10/29/2015 1015   EOSABS 0.0 10/29/2015 1015   BASOSABS 0.0 10/29/2015 1015    No results found for: POCLITH, LITHIUM   No results found for: PHENYTOIN, PHENOBARB, VALPROATE, CBMZ   .res Assessment: Plan:   Continue Lexapro 10 mg po qd for anxiety.  Recommend f/u in one year or sooner if clinically indicated. Patient advised to contact office with any questions, adverse effects, or acute worsening in signs and symptoms.  Mercedes Thompson was seen today for anxiety.  Diagnoses and all orders for this visit:  Generalized anxiety disorder -     escitalopram (LEXAPRO) 10 MG tablet; Take 1 tablet (10 mg total) by mouth daily.    Please see After Visit Summary for patient specific instructions.  No future appointments.  No orders of the defined types were placed in this encounter.     -------------------------------

## 2020-02-17 DIAGNOSIS — Z7901 Long term (current) use of anticoagulants: Secondary | ICD-10-CM | POA: Diagnosis not present

## 2020-02-21 DIAGNOSIS — R399 Unspecified symptoms and signs involving the genitourinary system: Secondary | ICD-10-CM | POA: Diagnosis not present

## 2020-02-21 DIAGNOSIS — Z7901 Long term (current) use of anticoagulants: Secondary | ICD-10-CM | POA: Diagnosis not present

## 2020-02-24 DIAGNOSIS — Z7901 Long term (current) use of anticoagulants: Secondary | ICD-10-CM | POA: Diagnosis not present

## 2020-03-07 DIAGNOSIS — Z7901 Long term (current) use of anticoagulants: Secondary | ICD-10-CM | POA: Diagnosis not present

## 2020-03-08 DIAGNOSIS — Z5181 Encounter for therapeutic drug level monitoring: Secondary | ICD-10-CM | POA: Diagnosis not present

## 2020-03-08 DIAGNOSIS — Z79899 Other long term (current) drug therapy: Secondary | ICD-10-CM | POA: Diagnosis not present

## 2020-03-08 DIAGNOSIS — Z952 Presence of prosthetic heart valve: Secondary | ICD-10-CM | POA: Diagnosis not present

## 2020-03-08 DIAGNOSIS — Z87440 Personal history of urinary (tract) infections: Secondary | ICD-10-CM | POA: Diagnosis not present

## 2020-03-08 DIAGNOSIS — M7989 Other specified soft tissue disorders: Secondary | ICD-10-CM | POA: Diagnosis not present

## 2020-03-08 DIAGNOSIS — Z7901 Long term (current) use of anticoagulants: Secondary | ICD-10-CM | POA: Diagnosis not present

## 2020-03-08 DIAGNOSIS — I4891 Unspecified atrial fibrillation: Secondary | ICD-10-CM | POA: Diagnosis not present

## 2020-03-08 DIAGNOSIS — I480 Paroxysmal atrial fibrillation: Secondary | ICD-10-CM | POA: Diagnosis not present

## 2020-03-08 DIAGNOSIS — Z950 Presence of cardiac pacemaker: Secondary | ICD-10-CM | POA: Diagnosis not present

## 2020-03-08 DIAGNOSIS — Z8679 Personal history of other diseases of the circulatory system: Secondary | ICD-10-CM | POA: Diagnosis not present

## 2020-03-08 DIAGNOSIS — Z856 Personal history of leukemia: Secondary | ICD-10-CM | POA: Diagnosis not present

## 2020-03-08 DIAGNOSIS — I10 Essential (primary) hypertension: Secondary | ICD-10-CM | POA: Diagnosis not present

## 2020-03-12 DIAGNOSIS — Z950 Presence of cardiac pacemaker: Secondary | ICD-10-CM | POA: Diagnosis not present

## 2020-03-12 DIAGNOSIS — I517 Cardiomegaly: Secondary | ICD-10-CM | POA: Diagnosis not present

## 2020-03-14 DIAGNOSIS — G4733 Obstructive sleep apnea (adult) (pediatric): Secondary | ICD-10-CM | POA: Diagnosis not present

## 2020-03-16 DIAGNOSIS — M7752 Other enthesopathy of left foot: Secondary | ICD-10-CM | POA: Diagnosis not present

## 2020-03-16 DIAGNOSIS — M67372 Transient synovitis, left ankle and foot: Secondary | ICD-10-CM | POA: Diagnosis not present

## 2020-03-28 DIAGNOSIS — Z7901 Long term (current) use of anticoagulants: Secondary | ICD-10-CM | POA: Diagnosis not present

## 2020-03-30 DIAGNOSIS — M67372 Transient synovitis, left ankle and foot: Secondary | ICD-10-CM | POA: Diagnosis not present

## 2020-03-30 DIAGNOSIS — M7752 Other enthesopathy of left foot: Secondary | ICD-10-CM | POA: Diagnosis not present

## 2020-04-06 DIAGNOSIS — Z7901 Long term (current) use of anticoagulants: Secondary | ICD-10-CM | POA: Diagnosis not present

## 2020-04-10 DIAGNOSIS — G4733 Obstructive sleep apnea (adult) (pediatric): Secondary | ICD-10-CM | POA: Diagnosis not present

## 2020-04-19 DIAGNOSIS — Z950 Presence of cardiac pacemaker: Secondary | ICD-10-CM | POA: Diagnosis not present

## 2020-04-19 DIAGNOSIS — I495 Sick sinus syndrome: Secondary | ICD-10-CM | POA: Diagnosis not present

## 2020-04-19 DIAGNOSIS — I480 Paroxysmal atrial fibrillation: Secondary | ICD-10-CM | POA: Diagnosis not present

## 2020-04-19 DIAGNOSIS — Z45010 Encounter for checking and testing of cardiac pacemaker pulse generator [battery]: Secondary | ICD-10-CM | POA: Diagnosis not present

## 2020-04-19 DIAGNOSIS — Z45018 Encounter for adjustment and management of other part of cardiac pacemaker: Secondary | ICD-10-CM | POA: Diagnosis not present

## 2020-04-28 DIAGNOSIS — L821 Other seborrheic keratosis: Secondary | ICD-10-CM | POA: Diagnosis not present

## 2020-05-01 DIAGNOSIS — Z7901 Long term (current) use of anticoagulants: Secondary | ICD-10-CM | POA: Diagnosis not present

## 2020-05-11 DIAGNOSIS — F411 Generalized anxiety disorder: Secondary | ICD-10-CM | POA: Diagnosis not present

## 2020-05-16 DIAGNOSIS — N390 Urinary tract infection, site not specified: Secondary | ICD-10-CM | POA: Diagnosis not present

## 2020-05-16 DIAGNOSIS — R319 Hematuria, unspecified: Secondary | ICD-10-CM | POA: Diagnosis not present

## 2020-05-23 DIAGNOSIS — Z7901 Long term (current) use of anticoagulants: Secondary | ICD-10-CM | POA: Diagnosis not present

## 2020-05-25 DIAGNOSIS — F411 Generalized anxiety disorder: Secondary | ICD-10-CM | POA: Diagnosis not present

## 2020-05-30 DIAGNOSIS — Z20822 Contact with and (suspected) exposure to covid-19: Secondary | ICD-10-CM | POA: Diagnosis not present

## 2020-06-05 DIAGNOSIS — Z7901 Long term (current) use of anticoagulants: Secondary | ICD-10-CM | POA: Diagnosis not present

## 2020-06-07 DIAGNOSIS — Z7901 Long term (current) use of anticoagulants: Secondary | ICD-10-CM | POA: Diagnosis not present

## 2020-06-07 DIAGNOSIS — Z952 Presence of prosthetic heart valve: Secondary | ICD-10-CM | POA: Diagnosis not present

## 2020-06-07 DIAGNOSIS — I4892 Unspecified atrial flutter: Secondary | ICD-10-CM | POA: Diagnosis not present

## 2020-06-07 DIAGNOSIS — I495 Sick sinus syndrome: Secondary | ICD-10-CM | POA: Diagnosis not present

## 2020-06-07 DIAGNOSIS — I480 Paroxysmal atrial fibrillation: Secondary | ICD-10-CM | POA: Diagnosis not present

## 2020-06-08 DIAGNOSIS — F411 Generalized anxiety disorder: Secondary | ICD-10-CM | POA: Diagnosis not present

## 2020-06-13 DIAGNOSIS — Z7901 Long term (current) use of anticoagulants: Secondary | ICD-10-CM | POA: Diagnosis not present

## 2020-06-20 DIAGNOSIS — Z7901 Long term (current) use of anticoagulants: Secondary | ICD-10-CM | POA: Diagnosis not present

## 2020-06-22 DIAGNOSIS — F411 Generalized anxiety disorder: Secondary | ICD-10-CM | POA: Diagnosis not present

## 2020-07-05 DIAGNOSIS — Z7901 Long term (current) use of anticoagulants: Secondary | ICD-10-CM | POA: Diagnosis not present

## 2020-07-10 DIAGNOSIS — G4733 Obstructive sleep apnea (adult) (pediatric): Secondary | ICD-10-CM | POA: Diagnosis not present

## 2020-07-17 DIAGNOSIS — I480 Paroxysmal atrial fibrillation: Secondary | ICD-10-CM | POA: Diagnosis not present

## 2020-07-17 DIAGNOSIS — Z950 Presence of cardiac pacemaker: Secondary | ICD-10-CM | POA: Diagnosis not present

## 2020-07-17 DIAGNOSIS — I5032 Chronic diastolic (congestive) heart failure: Secondary | ICD-10-CM | POA: Diagnosis not present

## 2020-07-17 DIAGNOSIS — Z952 Presence of prosthetic heart valve: Secondary | ICD-10-CM | POA: Diagnosis not present

## 2020-07-19 DIAGNOSIS — Z45018 Encounter for adjustment and management of other part of cardiac pacemaker: Secondary | ICD-10-CM | POA: Diagnosis not present

## 2020-07-19 DIAGNOSIS — I471 Supraventricular tachycardia: Secondary | ICD-10-CM | POA: Diagnosis not present

## 2020-07-20 DIAGNOSIS — I4891 Unspecified atrial fibrillation: Secondary | ICD-10-CM | POA: Diagnosis not present

## 2020-07-20 DIAGNOSIS — G4733 Obstructive sleep apnea (adult) (pediatric): Secondary | ICD-10-CM | POA: Diagnosis not present

## 2020-07-20 DIAGNOSIS — Z0000 Encounter for general adult medical examination without abnormal findings: Secondary | ICD-10-CM | POA: Diagnosis not present

## 2020-07-20 DIAGNOSIS — Z7901 Long term (current) use of anticoagulants: Secondary | ICD-10-CM | POA: Diagnosis not present

## 2020-07-20 DIAGNOSIS — F330 Major depressive disorder, recurrent, mild: Secondary | ICD-10-CM | POA: Diagnosis not present

## 2020-07-20 DIAGNOSIS — E538 Deficiency of other specified B group vitamins: Secondary | ICD-10-CM | POA: Diagnosis not present

## 2020-07-20 DIAGNOSIS — E785 Hyperlipidemia, unspecified: Secondary | ICD-10-CM | POA: Diagnosis not present

## 2020-07-20 DIAGNOSIS — F419 Anxiety disorder, unspecified: Secondary | ICD-10-CM | POA: Diagnosis not present

## 2020-07-20 DIAGNOSIS — R5382 Chronic fatigue, unspecified: Secondary | ICD-10-CM | POA: Diagnosis not present

## 2020-07-24 DIAGNOSIS — F411 Generalized anxiety disorder: Secondary | ICD-10-CM | POA: Diagnosis not present

## 2020-08-04 DIAGNOSIS — Z7901 Long term (current) use of anticoagulants: Secondary | ICD-10-CM | POA: Diagnosis not present

## 2020-08-23 DIAGNOSIS — Z79899 Other long term (current) drug therapy: Secondary | ICD-10-CM | POA: Diagnosis not present

## 2020-08-23 DIAGNOSIS — Z950 Presence of cardiac pacemaker: Secondary | ICD-10-CM | POA: Diagnosis not present

## 2020-08-23 DIAGNOSIS — Z7901 Long term (current) use of anticoagulants: Secondary | ICD-10-CM | POA: Diagnosis not present

## 2020-08-23 DIAGNOSIS — I4891 Unspecified atrial fibrillation: Secondary | ICD-10-CM | POA: Diagnosis not present

## 2020-08-23 DIAGNOSIS — Z5181 Encounter for therapeutic drug level monitoring: Secondary | ICD-10-CM | POA: Diagnosis not present

## 2020-08-23 DIAGNOSIS — Z45018 Encounter for adjustment and management of other part of cardiac pacemaker: Secondary | ICD-10-CM | POA: Diagnosis not present

## 2020-08-23 DIAGNOSIS — Z8679 Personal history of other diseases of the circulatory system: Secondary | ICD-10-CM | POA: Diagnosis not present

## 2020-08-23 DIAGNOSIS — I471 Supraventricular tachycardia: Secondary | ICD-10-CM | POA: Diagnosis not present

## 2020-08-23 DIAGNOSIS — Z9889 Other specified postprocedural states: Secondary | ICD-10-CM | POA: Diagnosis not present

## 2020-08-24 DIAGNOSIS — I447 Left bundle-branch block, unspecified: Secondary | ICD-10-CM | POA: Diagnosis not present

## 2020-08-24 DIAGNOSIS — Z950 Presence of cardiac pacemaker: Secondary | ICD-10-CM | POA: Diagnosis not present

## 2020-08-31 DIAGNOSIS — Z1231 Encounter for screening mammogram for malignant neoplasm of breast: Secondary | ICD-10-CM | POA: Diagnosis not present

## 2020-09-04 DIAGNOSIS — F411 Generalized anxiety disorder: Secondary | ICD-10-CM | POA: Diagnosis not present

## 2020-09-04 DIAGNOSIS — Z7901 Long term (current) use of anticoagulants: Secondary | ICD-10-CM | POA: Diagnosis not present

## 2020-10-04 DIAGNOSIS — Z7901 Long term (current) use of anticoagulants: Secondary | ICD-10-CM | POA: Diagnosis not present

## 2020-10-09 DIAGNOSIS — G4733 Obstructive sleep apnea (adult) (pediatric): Secondary | ICD-10-CM | POA: Diagnosis not present

## 2020-10-18 DIAGNOSIS — I471 Supraventricular tachycardia: Secondary | ICD-10-CM | POA: Diagnosis not present

## 2020-10-18 DIAGNOSIS — Z7901 Long term (current) use of anticoagulants: Secondary | ICD-10-CM | POA: Diagnosis not present

## 2020-10-18 DIAGNOSIS — Z950 Presence of cardiac pacemaker: Secondary | ICD-10-CM | POA: Diagnosis not present

## 2020-10-18 DIAGNOSIS — I480 Paroxysmal atrial fibrillation: Secondary | ICD-10-CM | POA: Diagnosis not present

## 2020-10-18 DIAGNOSIS — Z45018 Encounter for adjustment and management of other part of cardiac pacemaker: Secondary | ICD-10-CM | POA: Diagnosis not present

## 2020-10-23 DIAGNOSIS — Z5181 Encounter for therapeutic drug level monitoring: Secondary | ICD-10-CM | POA: Diagnosis not present

## 2020-10-23 DIAGNOSIS — Z950 Presence of cardiac pacemaker: Secondary | ICD-10-CM | POA: Diagnosis not present

## 2020-10-23 DIAGNOSIS — Z9889 Other specified postprocedural states: Secondary | ICD-10-CM | POA: Diagnosis not present

## 2020-10-23 DIAGNOSIS — Z8679 Personal history of other diseases of the circulatory system: Secondary | ICD-10-CM | POA: Diagnosis not present

## 2020-10-23 DIAGNOSIS — I480 Paroxysmal atrial fibrillation: Secondary | ICD-10-CM | POA: Diagnosis not present

## 2020-10-23 DIAGNOSIS — Z952 Presence of prosthetic heart valve: Secondary | ICD-10-CM | POA: Diagnosis not present

## 2020-10-23 DIAGNOSIS — I4891 Unspecified atrial fibrillation: Secondary | ICD-10-CM | POA: Diagnosis not present

## 2020-10-23 DIAGNOSIS — Z79899 Other long term (current) drug therapy: Secondary | ICD-10-CM | POA: Diagnosis not present

## 2020-10-25 DIAGNOSIS — Z7901 Long term (current) use of anticoagulants: Secondary | ICD-10-CM | POA: Diagnosis not present

## 2020-10-30 DIAGNOSIS — K5792 Diverticulitis of intestine, part unspecified, without perforation or abscess without bleeding: Secondary | ICD-10-CM | POA: Diagnosis not present

## 2020-11-03 DIAGNOSIS — Z7901 Long term (current) use of anticoagulants: Secondary | ICD-10-CM | POA: Diagnosis not present

## 2020-11-06 DIAGNOSIS — Z7901 Long term (current) use of anticoagulants: Secondary | ICD-10-CM | POA: Diagnosis not present

## 2020-11-10 DIAGNOSIS — B373 Candidiasis of vulva and vagina: Secondary | ICD-10-CM | POA: Diagnosis not present

## 2020-11-20 DIAGNOSIS — B373 Candidiasis of vulva and vagina: Secondary | ICD-10-CM | POA: Diagnosis not present

## 2020-11-20 DIAGNOSIS — N907 Vulvar cyst: Secondary | ICD-10-CM | POA: Diagnosis not present

## 2020-11-23 DIAGNOSIS — Z7901 Long term (current) use of anticoagulants: Secondary | ICD-10-CM | POA: Diagnosis not present

## 2020-12-01 DIAGNOSIS — Z7901 Long term (current) use of anticoagulants: Secondary | ICD-10-CM | POA: Diagnosis not present

## 2020-12-04 DIAGNOSIS — N763 Subacute and chronic vulvitis: Secondary | ICD-10-CM | POA: Diagnosis not present

## 2020-12-20 DIAGNOSIS — Z7901 Long term (current) use of anticoagulants: Secondary | ICD-10-CM | POA: Diagnosis not present

## 2021-01-03 DIAGNOSIS — Z7901 Long term (current) use of anticoagulants: Secondary | ICD-10-CM | POA: Diagnosis not present

## 2021-01-10 DIAGNOSIS — G4733 Obstructive sleep apnea (adult) (pediatric): Secondary | ICD-10-CM | POA: Diagnosis not present

## 2021-01-10 DIAGNOSIS — R059 Cough, unspecified: Secondary | ICD-10-CM | POA: Diagnosis not present

## 2021-01-17 DIAGNOSIS — Z45010 Encounter for checking and testing of cardiac pacemaker pulse generator [battery]: Secondary | ICD-10-CM | POA: Diagnosis not present

## 2021-01-17 DIAGNOSIS — Z950 Presence of cardiac pacemaker: Secondary | ICD-10-CM | POA: Diagnosis not present

## 2021-01-17 DIAGNOSIS — I471 Supraventricular tachycardia: Secondary | ICD-10-CM | POA: Diagnosis not present

## 2021-01-17 DIAGNOSIS — Z7901 Long term (current) use of anticoagulants: Secondary | ICD-10-CM | POA: Diagnosis not present

## 2021-01-17 DIAGNOSIS — I480 Paroxysmal atrial fibrillation: Secondary | ICD-10-CM | POA: Diagnosis not present

## 2021-01-30 DIAGNOSIS — H2513 Age-related nuclear cataract, bilateral: Secondary | ICD-10-CM | POA: Diagnosis not present

## 2021-01-31 DIAGNOSIS — Z7901 Long term (current) use of anticoagulants: Secondary | ICD-10-CM | POA: Diagnosis not present

## 2021-02-15 DIAGNOSIS — Z7901 Long term (current) use of anticoagulants: Secondary | ICD-10-CM | POA: Diagnosis not present

## 2021-02-21 DIAGNOSIS — Z7901 Long term (current) use of anticoagulants: Secondary | ICD-10-CM | POA: Diagnosis not present

## 2021-02-21 DIAGNOSIS — Z9889 Other specified postprocedural states: Secondary | ICD-10-CM | POA: Diagnosis not present

## 2021-02-21 DIAGNOSIS — Z79899 Other long term (current) drug therapy: Secondary | ICD-10-CM | POA: Diagnosis not present

## 2021-02-21 DIAGNOSIS — Z8679 Personal history of other diseases of the circulatory system: Secondary | ICD-10-CM | POA: Diagnosis not present

## 2021-02-21 DIAGNOSIS — I495 Sick sinus syndrome: Secondary | ICD-10-CM | POA: Diagnosis not present

## 2021-02-21 DIAGNOSIS — I480 Paroxysmal atrial fibrillation: Secondary | ICD-10-CM | POA: Diagnosis not present

## 2021-02-21 DIAGNOSIS — Z5181 Encounter for therapeutic drug level monitoring: Secondary | ICD-10-CM | POA: Diagnosis not present

## 2021-02-21 DIAGNOSIS — Z950 Presence of cardiac pacemaker: Secondary | ICD-10-CM | POA: Diagnosis not present

## 2021-02-23 DIAGNOSIS — I454 Nonspecific intraventricular block: Secondary | ICD-10-CM | POA: Diagnosis not present

## 2021-03-01 DIAGNOSIS — Z7901 Long term (current) use of anticoagulants: Secondary | ICD-10-CM | POA: Diagnosis not present

## 2021-03-09 DIAGNOSIS — Z7901 Long term (current) use of anticoagulants: Secondary | ICD-10-CM | POA: Diagnosis not present

## 2021-03-23 DIAGNOSIS — Z7901 Long term (current) use of anticoagulants: Secondary | ICD-10-CM | POA: Diagnosis not present

## 2021-03-28 ENCOUNTER — Other Ambulatory Visit: Payer: Self-pay

## 2021-03-28 DIAGNOSIS — F411 Generalized anxiety disorder: Secondary | ICD-10-CM

## 2021-03-28 MED ORDER — ESCITALOPRAM OXALATE 10 MG PO TABS: 10.0000 mg | ORAL_TABLET | Freq: Every day | ORAL | 0 refills | Status: DC

## 2021-04-06 DIAGNOSIS — Z7901 Long term (current) use of anticoagulants: Secondary | ICD-10-CM | POA: Diagnosis not present

## 2021-04-10 DIAGNOSIS — G4733 Obstructive sleep apnea (adult) (pediatric): Secondary | ICD-10-CM | POA: Diagnosis not present

## 2021-04-18 DIAGNOSIS — Z4509 Encounter for adjustment and management of other cardiac device: Secondary | ICD-10-CM | POA: Diagnosis not present

## 2021-04-18 DIAGNOSIS — I471 Supraventricular tachycardia: Secondary | ICD-10-CM | POA: Diagnosis not present

## 2021-04-18 DIAGNOSIS — Z950 Presence of cardiac pacemaker: Secondary | ICD-10-CM | POA: Diagnosis not present

## 2021-04-19 ENCOUNTER — Telehealth (INDEPENDENT_AMBULATORY_CARE_PROVIDER_SITE_OTHER): Payer: Medicare PPO | Admitting: Psychiatry

## 2021-04-19 ENCOUNTER — Other Ambulatory Visit: Payer: Self-pay

## 2021-04-19 ENCOUNTER — Encounter: Payer: Self-pay | Admitting: Psychiatry

## 2021-04-19 DIAGNOSIS — Z7901 Long term (current) use of anticoagulants: Secondary | ICD-10-CM | POA: Diagnosis not present

## 2021-04-19 DIAGNOSIS — F411 Generalized anxiety disorder: Secondary | ICD-10-CM

## 2021-04-19 MED ORDER — ESCITALOPRAM OXALATE 10 MG PO TABS: 10.0000 mg | ORAL_TABLET | Freq: Every day | ORAL | 3 refills | Status: DC

## 2021-04-19 NOTE — Progress Notes (Signed)
Mercedes Thompson [id number] 1946/04/07 75 y.o.  Virtual Visit via Telephone Note  I connected with pt on 04/19/21 at  2:30 PM EDT by telephone and verified that I am speaking with the correct person using two identifiers.   I discussed the limitations, risks, security and privacy concerns of performing an evaluation and management service by telephone and the availability of in person appointments. I also discussed with the patient that there may be a patient responsible charge related to this service. The patient expressed understanding and agreed to proceed.   I discussed the assessment and treatment plan with the patient. The patient was provided an opportunity to ask questions and all were answered. The patient agreed with the plan and demonstrated an understanding of the instructions.   The patient was advised to call back or seek an in-person evaluation if the symptoms worsen or if the condition fails to improve as anticipated.  I provided 20 minutes of non-face-to-face time during this encounter.  The patient was located at home.  The provider was located at [place].   Thayer Headings, PMHNP   Subjective:   Patient ID:  Mercedes Thompson is a 75 y.o. (DOB December 20, 1945) female.  Chief Complaint:  Chief Complaint  Patient presents with   Follow-up    H/o anxiety     HPI Mercedes Thompson presents for follow-up of anxiety. She reports that Lexapro 10 mg po qd continues to work well. Denies any excessive worry or rumination. Denies depressed mood. Sleeping well. She reports that her energy and motivation have been good. She reports that she would like to lose weight. She reports that her concentration has been good. Denies SI.   She reports that family has been doing well. Adult daughter has one child who will be 77 yo. Enjoys granddaughter. Son has moved to [facility] and she is able to visit him.   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   Takes Ativan 1-1.5 mg po QHS.    Review of Systems:  Review of Systems  Cardiovascular:  Negative for palpitations.  Musculoskeletal:  Negative for gait problem.       Knee pain  Neurological:  Negative for tremors.  Psychiatric/Behavioral:         Please refer to HPI   Medications: I have reviewed the patient's current medications.  Current Outpatient Medications  Medication Sig Dispense Refill   calcium-vitamin D (OSCAL WITH D) 500-200 MG-UNIT tablet Take 1 tablet by mouth daily with breakfast.     cholecalciferol (VITAMIN D) 1000 UNITS tablet Take 1,000 Units by mouth daily.     fluticasone (FLONASE) 50 MCG/ACT nasal spray Place 2 sprays into the nose daily as needed.     furosemide (LASIX) 20 MG tablet Take 20 mg by mouth daily as needed for fluid (for fluid retention over 2 lbs).      Lactobacillus (PROBIOTIC ACIDOPHILUS PO) Take by mouth.     LORazepam (ATIVAN) 1 MG tablet Take 1 mg by mouth at bedtime.      Multiple Vitamin (MULTIVITAMIN WITH MINERALS) TABS tablet Take 1 tablet by mouth daily.     SOTALOL AF 80 MG TABS Take 80 mg by mouth 2 (two) times daily.     warfarin (COUMADIN) 5 MG tablet Take 7.5 mg by mouth every morning.     albuterol (PROVENTIL HFA;VENTOLIN HFA) 108 (90 Base) MCG/ACT inhaler Inhale 2 puffs into the lungs every 6 (six) hours as needed for wheezing or shortness of breath.  escitalopram (LEXAPRO) 10 MG tablet Take 1 tablet (10 mg total) by mouth daily. 90 tablet 3   saccharomyces boulardii (FLORASTOR) 250 MG capsule Take 1 capsule (250 mg total) by mouth 2 (two) times daily. (Patient not taking: Reported on 04/19/2021) 60 capsule 3   No current facility-administered medications for this visit.    Medication Side Effects: Other: Possible weight gain  Allergies:  Allergies  Allergen Reactions   Bupropion Nausea Only    Other reaction(s): Unknown    Sulfa Antibiotics     AFFECTS COUMADIN   Decongest-Aid [Pseudoephedrine] Palpitations    Past Medical History:  Diagnosis Date    AML (acute myeloblastic leukemia) ([place]) 1987   Anxiety    Appendicitis with peritoneal abscess 03/2015   s/p ex lap at [facility]   Atrial flutter [facility]) 2016   s/p cardioversion 02/2015, previously in [place], recently taken off amiodarone   Bradycardia 2015   s/p PPM insertion   H/O mitral valve replacement with mechanical valve    coumadin    Family History  Problem Relation Age of Onset   Depression Mother    Cancer Father        LUNG   Colon polyps Father    Ulcerative colitis Neg Hx    Crohn's disease Neg Hx     Social History   Socioeconomic History   Marital status: Married    Spouse name: Not on file   Number of children: Not on file   Years of education: Not on file   Highest education level: Not on file  Occupational History   Not on file  Tobacco Use   Smoking status: Never   Smokeless tobacco: Never  Substance and Sexual Activity   Alcohol use: No    Comment: rare   Drug use: No   Sexual activity: Not on file  Other Topics Concern   Not on file  Social History Narrative   Not on file   Social Determinants of Health   Financial Resource Strain: Not on file  Food Insecurity: Not on file  Transportation Needs: Not on file  Physical Activity: Not on file  Stress: Not on file  Social Connections: Not on file  Intimate Partner Violence: Not on file    Past Medical History, Surgical history, Social history, and Family history were reviewed and updated as appropriate.   Please see review of systems for further details on the patient's review from today.   Objective:   Physical Exam:  There were no vitals taken for this visit.  Physical Exam Constitutional:      General: She is not in acute distress. Musculoskeletal:        General: No deformity.  Neurological:     Mental Status: She is alert and oriented to person, place, and time.     Coordination: Coordination normal.  Psychiatric:        Attention and Perception: Attention and  perception normal. She does not perceive auditory or visual hallucinations.        Mood and Affect: Mood normal. Mood is not anxious or depressed. Affect is not labile, blunt, angry or inappropriate.        Speech: Speech normal.        Behavior: Behavior normal.        Thought Content: Thought content normal. Thought content is not paranoid or delusional. Thought content does not include homicidal or suicidal ideation. Thought content does not include homicidal or suicidal plan.  Cognition and Memory: Cognition and memory normal.        Judgment: Judgment normal.     Comments: Insight intact    Lab Review:     Component Value Date/Time   NA 139 10/30/2015 0543   K 4.2 10/30/2015 0543   CL 106 10/30/2015 0543   CO2 23 10/30/2015 0543   GLUCOSE 99 10/30/2015 0543   BUN 11 10/30/2015 0543   CREATININE 0.80 10/30/2015 0543   CALCIUM 8.6 (L) 10/30/2015 0543   PROT 8.0 10/29/2015 1015   ALBUMIN 4.0 10/29/2015 1015   AST 35 10/29/2015 1015   ALT 29 10/29/2015 1015   ALKPHOS 75 10/29/2015 1015   BILITOT 1.1 10/29/2015 1015   GFRNONAA >60 10/30/2015 0543   GFRAA >60 10/30/2015 0543       Component Value Date/Time   WBC 6.5 10/30/2015 0543   RBC 3.90 10/30/2015 0543   HGB 11.9 (L) 10/30/2015 0543   HCT 37.6 10/30/2015 0543   PLT 261 10/30/2015 0543   MCV 96.4 10/30/2015 0543   MCH 30.5 10/30/2015 0543   MCHC 31.6 10/30/2015 0543   RDW 13.6 10/30/2015 0543   LYMPHSABS 0.9 10/29/2015 1015   MONOABS 1.2 (H) 10/29/2015 1015   EOSABS 0.0 10/29/2015 1015   BASOSABS 0.0 10/29/2015 1015    No results found for: POCLITH, LITHIUM   No results found for: PHENYTOIN, PHENOBARB, VALPROATE, CBMZ   .res Assessment: Plan:   Will continue Lexapro 10 mg po qd for anxiety.  Discussed she could decrease Lexapro to 5 mg po qd if she continues to notice heat intolerance and it becomes intolerable.  She takes Ativan 1 mg po QHS as prescribed by her medical provider.  Pt to follow-up in  one year or sooner if clinically indicated.  Patient advised to contact office with any questions, adverse effects, or acute worsening in signs and symptoms.   Tanaja was seen today for follow-up.  Diagnoses and all orders for this visit:  Generalized anxiety disorder -     escitalopram (LEXAPRO) 10 MG tablet; Take 1 tablet (10 mg total) by mouth daily.   Please see After Visit Summary for patient specific instructions.  No future appointments.  No orders of the defined types were placed in this encounter.     -------------------------------

## 2021-05-03 DIAGNOSIS — Z7901 Long term (current) use of anticoagulants: Secondary | ICD-10-CM | POA: Diagnosis not present

## 2021-05-17 DIAGNOSIS — Z01419 Encounter for gynecological examination (general) (routine) without abnormal findings: Secondary | ICD-10-CM | POA: Diagnosis not present

## 2021-05-17 DIAGNOSIS — Z7901 Long term (current) use of anticoagulants: Secondary | ICD-10-CM | POA: Diagnosis not present

## 2021-05-18 ENCOUNTER — Other Ambulatory Visit: Payer: Self-pay | Admitting: Psychiatry

## 2021-05-18 DIAGNOSIS — F411 Generalized anxiety disorder: Secondary | ICD-10-CM

## 2021-05-22 ENCOUNTER — Telehealth: Payer: Self-pay | Admitting: Psychiatry

## 2021-05-22 ENCOUNTER — Other Ambulatory Visit: Payer: Self-pay

## 2021-05-22 DIAGNOSIS — F411 Generalized anxiety disorder: Secondary | ICD-10-CM

## 2021-05-22 MED ORDER — ESCITALOPRAM OXALATE 10 MG PO TABS: 10.0000 mg | ORAL_TABLET | Freq: Every day | ORAL | 0 refills | Status: DC

## 2021-05-22 NOTE — Telephone Encounter (Signed)
Patient called requesting a refill on the Lexapro a 90 day supply. She stated the pharmacy has switched to [facility], fax # [phone]. She stated request has been submitted per pharmacy, but waiting on a response from the office. Patient's last visit was a phone visit on 8/4 with no follow up scheduled.

## 2021-05-22 NOTE — Telephone Encounter (Signed)
Rx sent 

## 2021-05-25 DIAGNOSIS — Z7901 Long term (current) use of anticoagulants: Secondary | ICD-10-CM | POA: Diagnosis not present

## 2021-06-01 DIAGNOSIS — Z7901 Long term (current) use of anticoagulants: Secondary | ICD-10-CM | POA: Diagnosis not present

## 2021-06-14 DIAGNOSIS — Z7901 Long term (current) use of anticoagulants: Secondary | ICD-10-CM | POA: Diagnosis not present

## 2021-06-20 DIAGNOSIS — N3001 Acute cystitis with hematuria: Secondary | ICD-10-CM | POA: Diagnosis not present

## 2021-06-20 DIAGNOSIS — R300 Dysuria: Secondary | ICD-10-CM | POA: Diagnosis not present

## 2021-06-25 DIAGNOSIS — M6258 Muscle wasting and atrophy, not elsewhere classified, other site: Secondary | ICD-10-CM | POA: Diagnosis not present

## 2021-06-25 DIAGNOSIS — N3946 Mixed incontinence: Secondary | ICD-10-CM | POA: Diagnosis not present

## 2021-06-25 DIAGNOSIS — L723 Sebaceous cyst: Secondary | ICD-10-CM | POA: Diagnosis not present

## 2021-06-28 DIAGNOSIS — Z7901 Long term (current) use of anticoagulants: Secondary | ICD-10-CM | POA: Diagnosis not present

## 2021-07-12 DIAGNOSIS — Z7901 Long term (current) use of anticoagulants: Secondary | ICD-10-CM | POA: Diagnosis not present

## 2021-07-12 DIAGNOSIS — R300 Dysuria: Secondary | ICD-10-CM | POA: Diagnosis not present

## 2021-07-12 DIAGNOSIS — G4733 Obstructive sleep apnea (adult) (pediatric): Secondary | ICD-10-CM | POA: Diagnosis not present

## 2021-07-12 DIAGNOSIS — R3989 Other symptoms and signs involving the genitourinary system: Secondary | ICD-10-CM | POA: Diagnosis not present

## 2021-07-20 DIAGNOSIS — Z7901 Long term (current) use of anticoagulants: Secondary | ICD-10-CM | POA: Diagnosis not present

## 2021-07-26 DIAGNOSIS — S60222A Contusion of left hand, initial encounter: Secondary | ICD-10-CM | POA: Diagnosis not present

## 2021-08-03 DIAGNOSIS — Z7901 Long term (current) use of anticoagulants: Secondary | ICD-10-CM | POA: Diagnosis not present

## 2021-08-06 DIAGNOSIS — H10023 Other mucopurulent conjunctivitis, bilateral: Secondary | ICD-10-CM | POA: Diagnosis not present

## 2021-08-07 DIAGNOSIS — E785 Hyperlipidemia, unspecified: Secondary | ICD-10-CM | POA: Diagnosis not present

## 2021-08-07 DIAGNOSIS — Z952 Presence of prosthetic heart valve: Secondary | ICD-10-CM | POA: Diagnosis not present

## 2021-08-07 DIAGNOSIS — F419 Anxiety disorder, unspecified: Secondary | ICD-10-CM | POA: Diagnosis not present

## 2021-08-07 DIAGNOSIS — Z0000 Encounter for general adult medical examination without abnormal findings: Secondary | ICD-10-CM | POA: Diagnosis not present

## 2021-08-07 DIAGNOSIS — Z6838 Body mass index (BMI) 38.0-38.9, adult: Secondary | ICD-10-CM | POA: Diagnosis not present

## 2021-08-07 DIAGNOSIS — M79606 Pain in leg, unspecified: Secondary | ICD-10-CM | POA: Diagnosis not present

## 2021-08-07 DIAGNOSIS — G4700 Insomnia, unspecified: Secondary | ICD-10-CM | POA: Diagnosis not present

## 2021-08-07 DIAGNOSIS — W19XXXA Unspecified fall, initial encounter: Secondary | ICD-10-CM | POA: Diagnosis not present

## 2021-08-07 DIAGNOSIS — I4891 Unspecified atrial fibrillation: Secondary | ICD-10-CM | POA: Diagnosis not present

## 2021-08-11 DIAGNOSIS — I471 Supraventricular tachycardia: Secondary | ICD-10-CM | POA: Diagnosis not present

## 2021-08-11 DIAGNOSIS — Z45018 Encounter for adjustment and management of other part of cardiac pacemaker: Secondary | ICD-10-CM | POA: Diagnosis not present

## 2021-08-13 DIAGNOSIS — Z4509 Encounter for adjustment and management of other cardiac device: Secondary | ICD-10-CM | POA: Diagnosis not present

## 2021-08-13 DIAGNOSIS — I471 Supraventricular tachycardia: Secondary | ICD-10-CM | POA: Diagnosis not present

## 2021-08-16 ENCOUNTER — Other Ambulatory Visit: Payer: Self-pay | Admitting: Family Medicine

## 2021-08-16 DIAGNOSIS — E2839 Other primary ovarian failure: Secondary | ICD-10-CM

## 2021-08-27 DIAGNOSIS — I5032 Chronic diastolic (congestive) heart failure: Secondary | ICD-10-CM | POA: Diagnosis not present

## 2021-08-27 DIAGNOSIS — Z7901 Long term (current) use of anticoagulants: Secondary | ICD-10-CM | POA: Diagnosis not present

## 2021-08-27 DIAGNOSIS — Z5181 Encounter for therapeutic drug level monitoring: Secondary | ICD-10-CM | POA: Diagnosis not present

## 2021-08-27 DIAGNOSIS — Z79899 Other long term (current) drug therapy: Secondary | ICD-10-CM | POA: Diagnosis not present

## 2021-08-27 DIAGNOSIS — Z9889 Other specified postprocedural states: Secondary | ICD-10-CM | POA: Diagnosis not present

## 2021-08-27 DIAGNOSIS — Z950 Presence of cardiac pacemaker: Secondary | ICD-10-CM | POA: Diagnosis not present

## 2021-08-27 DIAGNOSIS — Z8679 Personal history of other diseases of the circulatory system: Secondary | ICD-10-CM | POA: Diagnosis not present

## 2021-08-27 DIAGNOSIS — I480 Paroxysmal atrial fibrillation: Secondary | ICD-10-CM | POA: Diagnosis not present

## 2021-08-27 DIAGNOSIS — I495 Sick sinus syndrome: Secondary | ICD-10-CM | POA: Diagnosis not present

## 2021-08-28 DIAGNOSIS — I499 Cardiac arrhythmia, unspecified: Secondary | ICD-10-CM | POA: Diagnosis not present

## 2021-08-28 DIAGNOSIS — Z45018 Encounter for adjustment and management of other part of cardiac pacemaker: Secondary | ICD-10-CM | POA: Diagnosis not present

## 2021-08-28 DIAGNOSIS — I454 Nonspecific intraventricular block: Secondary | ICD-10-CM | POA: Diagnosis not present

## 2021-08-28 DIAGNOSIS — I480 Paroxysmal atrial fibrillation: Secondary | ICD-10-CM | POA: Diagnosis not present

## 2021-08-28 DIAGNOSIS — I4891 Unspecified atrial fibrillation: Secondary | ICD-10-CM | POA: Diagnosis not present

## 2021-08-28 DIAGNOSIS — I4892 Unspecified atrial flutter: Secondary | ICD-10-CM | POA: Diagnosis not present

## 2021-08-28 DIAGNOSIS — I471 Supraventricular tachycardia: Secondary | ICD-10-CM | POA: Diagnosis not present

## 2021-08-31 DIAGNOSIS — Z7901 Long term (current) use of anticoagulants: Secondary | ICD-10-CM | POA: Diagnosis not present

## 2021-09-03 DIAGNOSIS — Z1231 Encounter for screening mammogram for malignant neoplasm of breast: Secondary | ICD-10-CM | POA: Diagnosis not present

## 2021-09-14 DIAGNOSIS — Z7901 Long term (current) use of anticoagulants: Secondary | ICD-10-CM | POA: Diagnosis not present

## 2021-10-02 DIAGNOSIS — Z7901 Long term (current) use of anticoagulants: Secondary | ICD-10-CM | POA: Diagnosis not present

## 2021-10-16 DIAGNOSIS — Z7901 Long term (current) use of anticoagulants: Secondary | ICD-10-CM | POA: Diagnosis not present

## 2021-10-22 DIAGNOSIS — E785 Hyperlipidemia, unspecified: Secondary | ICD-10-CM | POA: Diagnosis not present

## 2021-10-22 DIAGNOSIS — Z7901 Long term (current) use of anticoagulants: Secondary | ICD-10-CM | POA: Diagnosis not present

## 2021-11-01 DIAGNOSIS — Z7901 Long term (current) use of anticoagulants: Secondary | ICD-10-CM | POA: Diagnosis not present

## 2021-11-15 DIAGNOSIS — Z7901 Long term (current) use of anticoagulants: Secondary | ICD-10-CM | POA: Diagnosis not present

## 2021-11-28 DIAGNOSIS — N390 Urinary tract infection, site not specified: Secondary | ICD-10-CM | POA: Diagnosis not present

## 2021-12-17 DIAGNOSIS — Z7901 Long term (current) use of anticoagulants: Secondary | ICD-10-CM | POA: Diagnosis not present

## 2021-12-18 ENCOUNTER — Telehealth: Payer: Self-pay | Admitting: Psychiatry

## 2021-12-18 DIAGNOSIS — F411 Generalized anxiety disorder: Secondary | ICD-10-CM

## 2021-12-18 MED ORDER — ESCITALOPRAM OXALATE 10 MG PO TABS: 10.0000 mg | ORAL_TABLET | Freq: Every day | ORAL | 2 refills | Status: DC

## 2021-12-18 NOTE — Telephone Encounter (Signed)
Pt called and LM 9:08am. Her insurance changed and she needs her Escitalopram sent in to new pharmacy for [place] (p) [phone]. Please send 90 day with 2 rf.  ?

## 2021-12-18 NOTE — Telephone Encounter (Signed)
Sent!

## 2021-12-19 ENCOUNTER — Telehealth: Payer: Self-pay | Admitting: Psychiatry

## 2021-12-19 NOTE — Telephone Encounter (Signed)
Yes, please call pharmacy to authorize. She has been taking Lexapro for a few years without difficulty and her cardiologist is aware that she is taking Lexapro.  ?

## 2021-12-19 NOTE — Telephone Encounter (Signed)
Please see message. I will call the pharmacy, just want to get your approval first.  ?

## 2021-12-19 NOTE — Telephone Encounter (Signed)
[facility] called regarding Mercedes Thompson's Lexapro. They note Mercedes Thompson has an allergy to both Paxil and Zoloft. They are inquiring if they can fill it as she's been on this before. The pharmacy states that the Zoloft can cause heart arrhythmia and can cause the risk of TT prolonged arrhythmia. Please call them at [phone]. Reference number is [id number]. ? ?[facility] [place], [place] ? ?Phone:  [phone]  ?Fax:  [phone]  ? ? ? ?

## 2021-12-19 NOTE — Telephone Encounter (Signed)
Called [facility] and info provided.  ?

## 2022-01-04 DIAGNOSIS — Z7901 Long term (current) use of anticoagulants: Secondary | ICD-10-CM | POA: Diagnosis not present

## 2022-01-25 DIAGNOSIS — Z7901 Long term (current) use of anticoagulants: Secondary | ICD-10-CM | POA: Diagnosis not present

## 2022-01-28 ENCOUNTER — Ambulatory Visit: Payer: Medicare (Managed Care) | Attending: Family Medicine

## 2022-01-28 DIAGNOSIS — R262 Difficulty in walking, not elsewhere classified: Secondary | ICD-10-CM | POA: Diagnosis not present

## 2022-01-28 DIAGNOSIS — G8929 Other chronic pain: Secondary | ICD-10-CM | POA: Insufficient documentation

## 2022-01-28 DIAGNOSIS — M25561 Pain in right knee: Secondary | ICD-10-CM | POA: Insufficient documentation

## 2022-01-28 DIAGNOSIS — M25562 Pain in left knee: Secondary | ICD-10-CM | POA: Diagnosis not present

## 2022-01-28 DIAGNOSIS — M79652 Pain in left thigh: Secondary | ICD-10-CM | POA: Insufficient documentation

## 2022-01-28 DIAGNOSIS — M6281 Muscle weakness (generalized): Secondary | ICD-10-CM | POA: Diagnosis not present

## 2022-01-28 DIAGNOSIS — M79651 Pain in right thigh: Secondary | ICD-10-CM | POA: Diagnosis present

## 2022-01-28 NOTE — Therapy (Signed)
?OUTPATIENT PHYSICAL THERAPY LOWER EXTREMITY EVALUATION ? ? ?Patient Name: Mercedes Thompson ?MRN: [id number] ?DOB:[birth year], 76 y.o., female ?Today's Date: 01/28/2022 ? ? PT End of Session - 01/28/22 1228   ? ? Visit Number 1   ? Date for PT Re-Evaluation 03/25/22   ? Authorization Type [employer]age   ? Progress Note Due on Visit 10   ? PT Start Time 1229   ? PT Stop Time 1310   ? PT Time Calculation (min) 41 min   ? Activity Tolerance Patient tolerated treatment well   ? Behavior During Therapy [facility] for tasks assessed/performed   ? ?  ?  ? ?  ? ? ?Past Medical History:  ?Diagnosis Date  ? AML (acute myeloblastic leukemia) ([place]) 1987  ? Anxiety   ? Appendicitis with peritoneal abscess 03/2015  ? s/p ex lap at [facility]  ? Atrial flutter [facility]) 2016  ? s/p cardioversion 02/2015, previously in [place], recently taken off amiodarone  ? Bradycardia 2015  ? s/p PPM insertion  ? H/O mitral valve replacement with mechanical valve   ? coumadin  ? ?Past Surgical History:  ?Procedure Laterality Date  ? APPENDECTOMY  03/24/2015  ? [facility] Dr Radene Knee.  perforated appendicitis  ? heart ablation    ? MITRAL VALVE REPLACEMENT  1986  ? PACEMAKER INSERTION    ? TUBAL LIGATION    ? ?Patient Active Problem List  ? Diagnosis Date Noted  ? Appendicitis with perforation s/p lap appy 03/22/2015 10/29/2015  ? Intra-abdominal abscess post-lap appy July 2016 10/29/2015  ? Intra-abdominal abscess ([place]) 10/29/2015  ? Abscess of abdominal cavity ([place]) 10/29/2015  ? Artificial cardiac pacemaker 05/17/2015  ? Sinoatrial node dysfunction ([place]) 05/17/2015  ? Long term current use of anticoagulant 03/22/2015  ? Encounter for therapeutic drug level monitoring 03/07/2015  ? Female genuine stress incontinence 03/25/2013  ? Anxiety disorder 08/14/2011  ? Dyslipidemia 08/13/2011  ? Atrial fibrillation ([place]) 06/11/2011  ? H/O mitral valve replacement with mechanical valve 06/11/2011  ? ? ?PCP: Maurice Small, MD ? ?REFERRING PROVIDER: Nuala Alpha,  MD ? ?REFERRING DIAG: M79.651 (ICD-10-CM) - Pain in right thigh M79.652 (ICD-10-CM) - Pain in left thigh  ? ?THERAPY DIAG:  ?Chronic pain of right knee ? ?Chronic pain of left knee ? ?Difficulty in walking, not elsewhere classified ? ?Muscle weakness (generalized) ? ?ONSET DATE: 01/22/22 ? ?SUBJECTIVE:  ? ?SUBJECTIVE STATEMENT: ?Patient reports about 10 years ago she started having difficulty getting out of her low car and then covid hit and she became even more sedentary.  She feels she has gradually gotten weaker and the MD feels she may just be deconditioned.  She admits she did start a new statin drug about 3 to 4 months ago.  She describes her pain as muscle aching in the quads which worsens with activity and sit to stand. She hopes to gain strength and be able to get up and down from lower surfaces with increased ease.  ? ?PERTINENT HISTORY: ?See above ? ?PAIN:  ?Are you having pain? Yes: NPRS scale: 0/10 ?Pain location: bilateral thighs ?Pain description: aching ?Aggravating factors: standing, walking ?Relieving factors: rest ? ?PRECAUTIONS: None ? ?WEIGHT BEARING RESTRICTIONS No ? ?FALLS:  ?Has patient fallen in last 6 months? No ? ?LIVING ENVIRONMENT: ?Lives with: lives with their spouse ?Lives in: House/apartment ?Stairs: Yes: External: 7 steps; can reach both ?Has following equipment at home: None ? ?OCCUPATION: Retired ? ?PLOF: Independent ? ?PATIENT GOALS To be able to go up and  down steps without pain ? ? ?OBJECTIVE:  ? ?DIAGNOSTIC FINDINGS: none ? ?PATIENT SURVEYS:  ?FOTO 44 (goal 76) ? ?COGNITION: ? Overall cognitive status: Within functional limits for tasks assessed   ?  ?SENSATION: ?WFL ? ? ?POSTURE:  ?Adducted hips, poor alignment with mod valgus knees ? ?PALPATION: ?Seated flexion and extension  ? ?LE ROM: ? ? (Blank rows = not tested) ? ?LE MMT: ? ?MMT Right ?01/28/2022 Left ?01/28/2022  ?Knee flexion WNL   ?Knee extension WN   ? (Blank rows = not tested) ? ?LOWER EXTREMITY SPECIAL TESTS:  ?Knee  special tests: Apley's compression test: negative ? ?FUNCTIONAL TESTS:  ?5 times sit to stand: 13.10 ?Timed up and go (TUG): 8.62 ? ?GAIT: ?Distance walked: 50 ft ?Assistive device utilized: None ?Level of assistance: Modified independence ?Comments: Antalgic, slight trendelenburg ? ? ? ?TODAY'S TREATMENT: ?Initiated HEP and initial eval completed ? ? ?PATIENT EDUCATION:  ?Education details: Initiated HEP ?Person educated: Patient ?Education method: Explanation, Demonstration, Verbal cues, and Handouts ?Education comprehension: verbalized understanding, returned demonstration, and verbal cues required ? ? ?HOME EXERCISE PROGRAM: ?Access Code: [email or web address] ?URL: [email or web address] ?Date: 01/28/2022 ?Prepared by: Candyce Churn ? ?Exercises ?- Seated Long Arc Quad  - 1 x daily - 7 x weekly - 3 sets - 10 reps ?- Seated Hip Abduction  - 1 x daily - 7 x weekly - 3 sets - 10 reps ?- Side Stepping with Resistance at Feet  - 1 x daily - 7 x weekly - 3 sets - 10 reps ?- Sit to Stand Without Arm Support  - 1 x daily - 7 x weekly - 1 sets - 10 reps ? ?ASSESSMENT: ? ?CLINICAL IMPRESSION: ?Patient is a 76 y.o. female who was seen today for physical therapy evaluation and treatment for bilateral thigh and knee pain. She presents with good ROM bilateral LE's but weakness in quads, hip abduction and hip extension along with bilateral knee recurvatum and valgus.  Right knee valgus >Left.  She would benefit from LE strengthening and knee alignment training.   ? ?OBJECTIVE IMPAIRMENTS Abnormal gait, decreased mobility, difficulty walking, decreased ROM, decreased strength, increased muscle spasms, impaired flexibility, and pain.  ? ?ACTIVITY LIMITATIONS cleaning, community activity, driving, meal prep, occupation, laundry, yard work, and shopping.  ? ?PERSONAL FACTORS Age, Fitness, and 1-2 comorbidities: anxiety, bradycardia  are also affecting patient's functional outcome.  ? ? ?REHAB POTENTIAL: Good ? ?CLINICAL  DECISION MAKING: Stable/uncomplicated ? ?EVALUATION COMPLEXITY: Low ? ? ?GOALS: ?Goals reviewed with patient? Yes ? ?SHORT TERM GOALS: Target date: 02/25/2022  (Remove Blue Hyperlink) ? ?Patient will be independent with initial HEP  ?Baseline: ?Goal status: INITIAL ? ?2.  Pain report to be no greater than 4/10  ?Baseline:  ?Goal status: INITIAL ? ?3.  Patient to be able to demonstrate proper heel to toe progression ?Baseline:  ?Goal status: INITIAL ? ? ?LONG TERM GOALS: Target date: 03/25/2022  (Remove Blue Hyperlink) ? ?Patient to be independent with advanced HEP  ?Baseline:  ?Goal status: INITIAL ? ?2.  Patient to report pain no greater than 2/10  ?Baseline:  ?Goal status: INITIAL ? ?3.  Patient to be able to ascend and descend steps safely without knee instability episodes ?Baseline:  ?Goal status: INITIAL ? ?4.  Patient to be able to bend, stoop and squat with pain no greater than 2/10 in bilateral knees and thighs.  ?Baseline:  ?Goal status: INITIAL ? ?5.  Patient to be able to walk 1/2 mile without need to rest.  ?  Baseline:  ?Goal status: INITIAL ? ? ? ?PLAN: ?PT FREQUENCY: 2x/week ? ?PT DURATION: 8 weeks ? ?PLANNED INTERVENTIONS: Therapeutic exercises, Therapeutic activity, Neuromuscular re-education, Balance training, Gait training, Patient/Family education, Joint mobilization, Aquatic Therapy, Dry Needling, Electrical stimulation, Cryotherapy, Moist heat, Taping, Vasopneumatic device, Ultrasound, Ionotophoresis '4mg'$ /ml Dexamethasone, and Manual therapy ? ?PLAN FOR NEXT SESSION: Review HEP,  progress quad rehab and hip strength ? ? ?Anderson Malta B. Harlen Danford, PT ?01/28/22 9:53 PM  ?

## 2022-01-31 ENCOUNTER — Ambulatory Visit: Payer: Medicare (Managed Care) | Admitting: Physical Therapy

## 2022-01-31 DIAGNOSIS — R262 Difficulty in walking, not elsewhere classified: Secondary | ICD-10-CM

## 2022-01-31 DIAGNOSIS — M79651 Pain in right thigh: Secondary | ICD-10-CM | POA: Diagnosis not present

## 2022-01-31 DIAGNOSIS — G8929 Other chronic pain: Secondary | ICD-10-CM

## 2022-01-31 DIAGNOSIS — M6281 Muscle weakness (generalized): Secondary | ICD-10-CM

## 2022-01-31 NOTE — Therapy (Signed)
OUTPATIENT PHYSICAL THERAPY TREATMENT NOTE   Patient Name: Mercedes Thompson MRN: [id number] DOB:Apr 04, 1946, 76 y.o., female Today's Date: 01/31/2022   REFERRING PROVIDER: Nuala Alpha, MD  END OF SESSION:   PT End of Session - 01/31/22 1531     Visit Number 2    Date for PT Re-Evaluation 03/25/22    Authorization Type [employer] Advantage    Progress Note Due on Visit 10    PT Start Time 1531    PT Stop Time 1613    PT Time Calculation (min) 42 min    Activity Tolerance Patient tolerated treatment well             Past Medical History:  Diagnosis Date   AML (acute myeloblastic leukemia) ([place]) 1987   Anxiety    Appendicitis with peritoneal abscess 03/2015   s/p ex lap at [facility]   Atrial flutter [facility]) 2016   s/p cardioversion 02/2015, previously in [place], recently taken off amiodarone   Bradycardia 2015   s/p PPM insertion   H/O mitral valve replacement with mechanical valve    coumadin   Past Surgical History:  Procedure Laterality Date   APPENDECTOMY  03/24/2015   [facility] Dr Radene Knee.  perforated appendicitis   heart ablation     MITRAL VALVE REPLACEMENT  1986   PACEMAKER INSERTION     TUBAL LIGATION     Patient Active Problem List   Diagnosis Date Noted   Appendicitis with perforation s/p lap appy 03/22/2015 10/29/2015   Intra-abdominal abscess post-lap appy July 2016 10/29/2015   Intra-abdominal abscess ([place]) 10/29/2015   Abscess of abdominal cavity ([place]) 10/29/2015   Artificial cardiac pacemaker 05/17/2015   Sinoatrial node dysfunction ([place]) 05/17/2015   Long term current use of anticoagulant 03/22/2015   Encounter for therapeutic drug level monitoring 03/07/2015   Female genuine stress incontinence 03/25/2013   Anxiety disorder 08/14/2011   Dyslipidemia 08/13/2011   Atrial fibrillation ([place]) 06/11/2011   H/O mitral valve replacement with mechanical valve 06/11/2011    REFERRING DIAG: M79.651 (ICD-10-CM) - Pain in right thigh M79.652 (ICD-10-CM) -  Pain in left thigh      THERAPY DIAG: Chronic pain of right knee   Chronic pain of left knee   Difficulty in walking, not elsewhere classified   Muscle weakness (generalized)  PERTINENT HISTORY: Patient reports about 10 years ago she started having difficulty getting out of her low car and then covid hit and she became even more sedentary.  She feels she has gradually gotten weaker and the MD feels she may just be deconditioned.  She admits she did start a new statin drug about 3 to 4 months ago.  She describes her pain as muscle aching in the quads which worsens with activity and sit to stand. She hopes to gain strength and be able to get up and down from lower surfaces with increased ease.   Ankle pain seeing the doctor next week.   PRECAUTIONS: none  SUBJECTIVE: Since last eval I feel lateral knee pain on right.  That's different from where I've been feeling it.    PAIN:  Are you having pain? No but with movements will feel it NPRS scale: 0/10 Pain location: right lateral knee Aggravating factors: when I first stand up  OBJECTIVE: (objective measures completed at initial evaluation unless otherwise dated)  OBJECTIVE:    DIAGNOSTIC FINDINGS: none   PATIENT SURVEYS:  FOTO 44 (goal 50)   COGNITION:  Overall cognitive status: Within functional limits for tasks assessed                          SENSATION: WFL     POSTURE:  Adducted hips, poor alignment with mod valgus knees   PALPATION: Seated flexion and extension    LE ROM:    (Blank rows = not tested)   LE MMT:   MMT Right 01/28/2022 Left 01/28/2022  Knee flexion WNL    Knee extension WN     (Blank rows = not tested)   LOWER EXTREMITY SPECIAL TESTS:  Knee special tests: Apley's compression test: negative   FUNCTIONAL TESTS:  5 times sit to stand: 13.10 Timed up and go (TUG): 8.62   GAIT: Distance walked: 50 ft Assistive device utilized: None Level of assistance: Modified  independence Comments: Antalgic, slight trendelenburg       TODAY'S TREATMENT: 5/18: Review of initial HEP with modification to side stepping with band on feet/ankles secondary to lateral knee irritation Neuromuscular re-ed: patellofemoral alignment with sit to stand and single leg Red band above knees with bent hips/knees side step; Sit to stand with attention on alignment 10x Forward step overs golf club on floor with tight touch on back of chair 10x right/left (mirror feedback) Nu-step 5 min (lateral knee discomfort) so discontinued Therapeutic activities: sit to stand, standing, walking in forward and lateral directions; preparation for stair climbing Discussed ice massage at lateral knee and cross friction massage         PATIENT EDUCATION:  Education details: Initiated HEP Person educated: Patient Education method: Consulting civil engineer, Media planner, Verbal cues, and Handouts Education comprehension: verbalized understanding, returned demonstration, and verbal cues required     [place] Code: [email or web address] URL: [email or web address] Date: 01/31/2022 Prepared by: Ruben Im  Exercises - Seated Long Arc Quad  - 1 x daily - 7 x weekly - 3 sets - 10 reps - Seated Hip Abduction  - 1 x daily - 7 x weekly - 3 sets - 10 reps - Sit to Stand Without Arm Support  - 1 x daily - 7 x weekly - 1 sets - 10 reps - Side Stepping with Resistance at Thighs and Counter Support  - 1 x daily - 7 x weekly - 1 sets - 10 reps - Forward and Backward Step Over with Beam (BKA)  - 1 x daily - 7 x weekly - 1 sets - 10 reps ASSESSMENT:   CLINICAL IMPRESSION: Significant knee valgus noted with sit to stand and with standing single leg movements.  Verbal cues for patellofemoral alignment awareness.  The patient has ITB insertion irritability noted with ankle banded resisted sidesteps so modified with crouched positioning and band above the knees.     OBJECTIVE IMPAIRMENTS  Abnormal gait, decreased mobility, difficulty walking, decreased ROM, decreased strength, increased muscle spasms, impaired flexibility, and pain.    ACTIVITY LIMITATIONS cleaning, community activity, driving, meal prep, occupation, laundry, yard work, and shopping.    PERSONAL FACTORS Age, Fitness, and 1-2 comorbidities: anxiety, bradycardia  are also affecting patient's functional outcome.      REHAB POTENTIAL: Good   CLINICAL DECISION MAKING: Stable/uncomplicated   EVALUATION COMPLEXITY: Low     GOALS: Goals reviewed with patient? Yes   SHORT TERM GOALS: Target date: 02/25/2022  (Remove Blue Hyperlink)   Patient will be independent with initial HEP  Baseline: Goal status: INITIAL   2.  Pain report to be no greater than 4/10  Baseline:  Goal status: INITIAL   3.  Patient to be able to demonstrate proper heel to toe progression Baseline:  Goal status: INITIAL     LONG TERM GOALS: Target date: 03/25/2022  (Remove Blue Hyperlink)   Patient to be independent with advanced HEP  Baseline:  Goal status: INITIAL   2.  Patient to report pain no greater than 2/10  Baseline:  Goal status: INITIAL   3.  Patient to be able to ascend and descend steps safely without knee instability episodes Baseline:  Goal status: INITIAL   4.  Patient to be able to bend, stoop and squat with pain no greater than 2/10 in bilateral knees and thighs.  Baseline:  Goal status: INITIAL   5.  Patient to be able to walk 1/2 mile without need to rest.  Baseline:  Goal status: INITIAL       PLAN: PT FREQUENCY: 2x/week   PT DURATION: 8 weeks   PLANNED INTERVENTIONS: Therapeutic exercises, Therapeutic activity, Neuromuscular re-education, Balance training, Gait training, Patient/Family education, Joint mobilization, Aquatic Therapy, Dry Needling, Electrical stimulation, Cryotherapy, Moist heat, Taping, Vasopneumatic device, Ultrasound, Ionotophoresis '4mg'$ /ml Dexamethasone, and Manual therapy    PLAN FOR NEXT SESSION: Review HEP with attention to patellofemoral alignment,  may need to address ITB irritability;  discussed ionto but pt unsure about whether OK with coumadin and heart considerations (told no steroids)  progress quad rehab and hip strength  Ruben Im, PT 01/31/22 5:16 PM Phone: [phone] Fax: [phone]

## 2022-01-31 NOTE — Patient Instructions (Signed)
Fill a styrofoam cup with water and freeze, cut out bottom and do circles along the ITB insertion 3-4 minutes OR bag of frozen peas 6-8 minutes to that insertion   Watch knee alignment with sit to stand;  remember space between the knees  Ruben Im, PT 01/31/22 4:09 PM Phone: [phone] Fax: [phone]

## 2022-02-06 ENCOUNTER — Ambulatory Visit: Payer: Medicare (Managed Care)

## 2022-02-06 ENCOUNTER — Ambulatory Visit: Payer: Medicare (Managed Care) | Admitting: Podiatry

## 2022-02-06 DIAGNOSIS — R262 Difficulty in walking, not elsewhere classified: Secondary | ICD-10-CM

## 2022-02-06 DIAGNOSIS — G8929 Other chronic pain: Secondary | ICD-10-CM

## 2022-02-06 DIAGNOSIS — M79651 Pain in right thigh: Secondary | ICD-10-CM | POA: Diagnosis not present

## 2022-02-06 DIAGNOSIS — M6281 Muscle weakness (generalized): Secondary | ICD-10-CM

## 2022-02-06 NOTE — Therapy (Signed)
OUTPATIENT PHYSICAL THERAPY TREATMENT NOTE   Patient Name: Mercedes Thompson MRN: [id number] DOB:Oct 20, 1945, 76 y.o., female Today's Date: 02/06/2022   REFERRING PROVIDER: Nuala Alpha, MD  END OF SESSION:   PT End of Session - 02/06/22 1712     Visit Number 3    Date for PT Re-Evaluation 03/25/22    Authorization Type [employer] Advantage    Progress Note Due on Visit 10    PT Start Time 1615    PT Stop Time 1655    PT Time Calculation (min) 40 min    Activity Tolerance Patient tolerated treatment well    Behavior During Therapy [facility] for tasks assessed/performed              Past Medical History:  Diagnosis Date   AML (acute myeloblastic leukemia) ([place]) 1987   Anxiety    Appendicitis with peritoneal abscess 03/2015   s/p ex lap at [facility]   Atrial flutter [facility]) 2016   s/p cardioversion 02/2015, previously in [place], recently taken off amiodarone   Bradycardia 2015   s/p PPM insertion   H/O mitral valve replacement with mechanical valve    coumadin   Past Surgical History:  Procedure Laterality Date   APPENDECTOMY  03/24/2015   [facility] Dr Radene Knee.  perforated appendicitis   heart ablation     MITRAL VALVE REPLACEMENT  1986   PACEMAKER INSERTION     TUBAL LIGATION     Patient Active Problem List   Diagnosis Date Noted   Appendicitis with perforation s/p lap appy 03/22/2015 10/29/2015   Intra-abdominal abscess post-lap appy July 2016 10/29/2015   Intra-abdominal abscess ([place]) 10/29/2015   Abscess of abdominal cavity ([place]) 10/29/2015   Artificial cardiac pacemaker 05/17/2015   Sinoatrial node dysfunction ([place]) 05/17/2015   Long term current use of anticoagulant 03/22/2015   Encounter for therapeutic drug level monitoring 03/07/2015   Female genuine stress incontinence 03/25/2013   Anxiety disorder 08/14/2011   Dyslipidemia 08/13/2011   Atrial fibrillation ([place]) 06/11/2011   H/O mitral valve replacement with mechanical valve 06/11/2011    REFERRING DIAG:  M79.651 (ICD-10-CM) - Pain in right thigh M79.652 (ICD-10-CM) - Pain in left thigh      THERAPY DIAG: Chronic pain of right knee   Chronic pain of left knee   Difficulty in walking, not elsewhere classified   Muscle weakness (generalized)  PERTINENT HISTORY: Patient reports about 10 years ago she started having difficulty getting out of her low car and then covid hit and she became even more sedentary.  She feels she has gradually gotten weaker and the MD feels she may just be deconditioned.  She admits she did start a new statin drug about 3 to 4 months ago.  She describes her pain as muscle aching in the quads which worsens with activity and sit to stand. She hopes to gain strength and be able to get up and down from lower surfaces with increased ease.   Ankle pain seeing the doctor next week.   PRECAUTIONS: none  SUBJECTIVE: Patient reports lateral knee pain continues but she has had significant increase in right knee pain from doing excessive band walks.  She states I did way too many according to last PT.  I called my doctor and requested to stop statin medication to see if this would help my muscle pain.     PAIN:  Are you having pain? No but with movements will feel it NPRS scale: 0/10 Pain location: right lateral knee Aggravating  factors: when I first stand up  OBJECTIVE: (objective measures completed at initial evaluation unless otherwise dated)  OBJECTIVE:    DIAGNOSTIC FINDINGS: none   PATIENT SURVEYS:  FOTO 44 (goal 28)   COGNITION:           Overall cognitive status: Within functional limits for tasks assessed                          SENSATION: WFL     POSTURE:  Adducted hips, poor alignment with mod valgus knees   PALPATION: Seated flexion and extension    LE ROM:    (Blank rows = not tested)   LE MMT:   MMT Right 01/28/2022 Left 01/28/2022  Knee flexion WNL    Knee extension WN     (Blank rows = not tested)   LOWER EXTREMITY SPECIAL TESTS:   Knee special tests: Apley's compression test: negative   FUNCTIONAL TESTS:  5 times sit to stand: 13.10 Timed up and go (TUG): 8.62   GAIT: Distance walked: 50 ft Assistive device utilized: None Level of assistance: Modified independence Comments: Antalgic, slight trendelenburg       TODAY'S TREATMENT: 02-06-22 NuStep x 5 min level 1 Seated LAQ 2 x 10 each both 0# Seated March x 20 0# Seated heel and toe raises x 20 Seated clam with green loop x 20 Seated hip ER 2 x 10 (bottom of foot to opposite ankle) Ball squeeze (purple) 2 x 10 Sit to stand from table with ball squeeze (from balance pad) IT band stretch in supine with strap 3 x 30 sec  TODAY'S TREATMENT: 01/31/22 Review of initial HEP with modification to side stepping with band on feet/ankles secondary to lateral knee irritation Neuromuscular re-ed: patellofemoral alignment with sit to stand and single leg Red band above knees with bent hips/knees side step; Sit to stand with attention on alignment 10x Forward step overs golf club on floor with tight touch on back of chair 10x right/left (mirror feedback) Nu-step 5 min (lateral knee discomfort) so discontinued Therapeutic activities: sit to stand, standing, walking in forward and lateral directions; preparation for stair climbing Discussed ice massage at lateral knee and cross friction massage         PATIENT EDUCATION:  Education details: Initiated HEP Person educated: Patient Education method: Consulting civil engineer, Demonstration, Verbal cues, and Handouts Education comprehension: verbalized understanding, returned demonstration, and verbal cues required  Access Code: [email or web address] URL: [email or web address] Date: 02/06/2022 Prepared by: Candyce Churn  Exercises - Seated Long Arc Quad  - 1 x daily - 7 x weekly - 3 sets - 10 reps - Seated Hip Abduction  - 1 x daily - 7 x weekly - 3 sets - 10 reps - Sit to Stand Without Arm Support  - 1 x daily - 7 x weekly  - 1 sets - 10 reps - Side Stepping with Resistance at Thighs and Counter Support  - 1 x daily - 7 x weekly - 1 sets - 10 reps - Forward and Backward Step Over with Beam (BKA)  - 1 x daily - 7 x weekly - 1 sets - 10 reps - Supine ITB Stretch with Strap  - 1 x daily - 7 x weekly - 1 sets - 3 reps - 30 sec hold   ASSESSMENT:   CLINICAL IMPRESSION:  Patient called MD about discontinuing her statin drug and MD agreed.  She just discontinued on this on Monday.  No significant changes  since Monday.  She was able to do most exercises today with minimal pain.  She had primarily lateral knee pain in the area of the IT band.  After stretching the IT band, patient felt relief.  She would benefit from continued skilled PT for quad rehab, alignment training, hip strengthening and pain control.    OBJECTIVE IMPAIRMENTS Abnormal gait, decreased mobility, difficulty walking, decreased ROM, decreased strength, increased muscle spasms, impaired flexibility, and pain.    ACTIVITY LIMITATIONS cleaning, community activity, driving, meal prep, occupation, laundry, yard work, and shopping.    PERSONAL FACTORS Age, Fitness, and 1-2 comorbidities: anxiety, bradycardia  are also affecting patient's functional outcome.      REHAB POTENTIAL: Good   CLINICAL DECISION MAKING: Stable/uncomplicated   EVALUATION COMPLEXITY: Low     GOALS: Goals reviewed with patient? Yes   SHORT TERM GOALS: Target date: 02/25/2022  (Remove Blue Hyperlink)   Patient will be independent with initial HEP  Baseline: Goal status: INITIAL   2.  Pain report to be no greater than 4/10  Baseline:  Goal status: INITIAL   3.  Patient to be able to demonstrate proper heel to toe progression Baseline:  Goal status: INITIAL     LONG TERM GOALS: Target date: 03/25/2022  (Remove Blue Hyperlink)   Patient to be independent with advanced HEP  Baseline:  Goal status: INITIAL   2.  Patient to report pain no greater than 2/10  Baseline:   Goal status: INITIAL   3.  Patient to be able to ascend and descend steps safely without knee instability episodes Baseline:  Goal status: INITIAL   4.  Patient to be able to bend, stoop and squat with pain no greater than 2/10 in bilateral knees and thighs.  Baseline:  Goal status: INITIAL   5.  Patient to be able to walk 1/2 mile without need to rest.  Baseline:  Goal status: INITIAL       PLAN: PT FREQUENCY: 2x/week   PT DURATION: 8 weeks   PLANNED INTERVENTIONS: Therapeutic exercises, Therapeutic activity, Neuromuscular re-education, Balance training, Gait training, Patient/Family education, Joint mobilization, Aquatic Therapy, Dry Needling, Electrical stimulation, Cryotherapy, Moist heat, Taping, Vasopneumatic device, Ultrasound, Ionotophoresis '4mg'$ /ml Dexamethasone, and Manual therapy   PLAN FOR NEXT SESSION: Review HEP with attention to patellofemoral alignment,  may need to address ITB irritability;  discussed ionto but pt unsure about whether OK with coumadin and heart considerations (told no steroids)  progress quad rehab and hip strength  Ruben Im, PT 02/06/22 5:14 PM Phone: [phone] Fax: [phone]

## 2022-02-12 ENCOUNTER — Ambulatory Visit: Payer: Medicare (Managed Care) | Admitting: Physical Therapy

## 2022-02-12 DIAGNOSIS — R262 Difficulty in walking, not elsewhere classified: Secondary | ICD-10-CM

## 2022-02-12 DIAGNOSIS — M6281 Muscle weakness (generalized): Secondary | ICD-10-CM

## 2022-02-12 DIAGNOSIS — M25562 Pain in left knee: Secondary | ICD-10-CM

## 2022-02-12 DIAGNOSIS — G8929 Other chronic pain: Secondary | ICD-10-CM

## 2022-02-12 DIAGNOSIS — M79651 Pain in right thigh: Secondary | ICD-10-CM | POA: Diagnosis not present

## 2022-02-12 NOTE — Therapy (Signed)
OUTPATIENT PHYSICAL THERAPY TREATMENT NOTE   Patient Name: Mercedes Thompson MRN: [id number] DOB:20-Nov-1945, 76 y.o., female Today's Date: 02/12/2022   REFERRING PROVIDER: Nuala Alpha, MD  END OF SESSION:   PT End of Session - 02/12/22 1528     Visit Number 4    Date for PT Re-Evaluation 03/25/22    Authorization Type [employer]vantage    Progress Note Due on Visit 10    PT Start Time 1530    PT Stop Time 1610    PT Time Calculation (min) 40 min    Activity Tolerance Patient tolerated treatment well              Past Medical History:  Diagnosis Date   AML (acute myeloblastic leukemia) ([place]) 1987   Anxiety    Appendicitis with peritoneal abscess 03/2015   [facility]   Atrial flutter [facility]) 2016   s/p cardioversion 02/2015, previously in [place], recently taken off amiodarone   Bradycardia 2015   s/p PPM insertion   H/O mitral valve replacement with mechanical valve    coumadin   Past Surgical History:  Procedure Laterality Date   APPENDECTOMY  03/24/2015   [facility] Dr Radene Knee.  perforated appendicitis   heart ablation     MITRAL VALVE REPLACEMENT  1986   PACEMAKER INSERTION     TUBAL LIGATION     Patient Active Problem List   Diagnosis Date Noted   Appendicitis with perforation s/p lap appy 03/22/2015 10/29/2015   Intra-abdominal abscess post-lap appy July 2016 10/29/2015   Intra-abdominal abscess ([place]) 10/29/2015   Abscess of abdominal cavity ([place]) 10/29/2015   Artificial cardiac pacemaker 05/17/2015   Sinoatrial node dysfunction ([place]) 05/17/2015   Long term current use of anticoagulant 03/22/2015   Encounter for therapeutic drug level monitoring 03/07/2015   Female genuine stress incontinence 03/25/2013   Anxiety disorder 08/14/2011   Dyslipidemia 08/13/2011   Atrial fibrillation ([place]) 06/11/2011   H/O mitral valve replacement with mechanical valve 06/11/2011    REFERRING DIAG: M79.651 (ICD-10-CM) - Pain in right thigh M79.652 (ICD-10-CM) -  Pain in left thigh      THERAPY DIAG: Chronic pain of right knee   Chronic pain of left knee   Difficulty in walking, not elsewhere classified   Muscle weakness (generalized)  PERTINENT HISTORY: Patient reports about 10 years ago she started having difficulty getting out of her low car and then covid hit and she became even more sedentary.  She feels she has gradually gotten weaker and the MD feels she may just be deconditioned.  She admits she did start a new statin drug about 3 to 4 months ago.  She describes her pain as muscle aching in the quads which worsens with activity and sit to stand. She hopes to gain strength and be able to get up and down from lower surfaces with increased ease.   Ankle pain seeing the doctor next week.   PRECAUTIONS: none  SUBJECTIVE: I walked today 2 blocks today and I can feel it in my thighs.    I'm not confident in coming down steps.    I love that stretching strap side to side one.    PAIN:  Are you having pain? No but with movements will feel it NPRS scale: 0/10 Pain location: right medial and lateral knee Aggravating factors: when I first stand up  OBJECTIVE: (objective measures completed at initial evaluation unless otherwise dated)  OBJECTIVE:    DIAGNOSTIC FINDINGS: none  PATIENT SURVEYS:  FOTO 54 (goal 53)   COGNITION:           Overall cognitive status: Within functional limits for tasks assessed                          SENSATION: WFL     POSTURE:  Adducted hips, poor alignment with mod valgus knees   PALPATION: Seated flexion and extension    LE ROM:    (Blank rows = not tested)   LE MMT:   MMT Right 01/28/2022 Left 01/28/2022  Knee flexion WNL    Knee extension WN     (Blank rows = not tested)   LOWER EXTREMITY SPECIAL TESTS:  Knee special tests: Apley's compression test: negative   FUNCTIONAL TESTS:  5 times sit to stand: 13.10 Timed up and go (TUG): 8.62   GAIT: Distance walked: 50 ft Assistive device  utilized: None Level of assistance: Modified independence Comments: Antalgic, slight trendelenburg    TODAY'S TREATMENT: 02-12-22  IT band stretch in supine with strap 3 x 30 sec Supine fig 4 stretch with self overpressure 3x 30 sec Sidelying clams 10x right/left  Seated submax quad isometric 20-30% effort--pt complained of patellar tendon region pain so applied McConnell tape across tendon 4 inch step ups 2x10 (no pain) WB on right with left step over the line 10x Therapeutic activity: stair climbing, ascending curbs, standing, walking, sit to stand     TODAY'S TREATMENT: 02-06-22 NuStep x 5 min level 1 Seated LAQ 2 x 10 each both 0# Seated March x 20 0# Seated heel and toe raises x 20 Seated clam with green loop x 20 Seated hip ER 2 x 10 (bottom of foot to opposite ankle) Ball squeeze (purple) 2 x 10 Sit to stand from table with ball squeeze (from balance pad) IT band stretch in supine with strap 3 x 30 sec  TODAY'S TREATMENT: 01/31/22 Review of initial HEP with modification to side stepping with band on feet/ankles secondary to lateral knee irritation Neuromuscular re-ed: patellofemoral alignment with sit to stand and single leg Red band above knees with bent hips/knees side step; Sit to stand with attention on alignment 10x Forward step overs golf club on floor with tight touch on back of chair 10x right/left (mirror feedback) Nu-step 5 min (lateral knee discomfort) so discontinued Therapeutic activities: sit to stand, standing, walking in forward and lateral directions; preparation for stair climbing Discussed ice massage at lateral knee and cross friction massage         PATIENT EDUCATION:  Education details: Initiated HEP Person educated: Patient Education method: Consulting civil engineer, Media planner, Verbal cues, and Handouts Education comprehension: verbalized understanding, returned demonstration, and verbal cues required  Access Code: [email or web address]  Exercises - Seated Long  Arc Quad  - 1 x daily - 7 x weekly - 3 sets - 10 reps - Seated Hip Abduction  - 1 x daily - 7 x weekly - 3 sets - 10 reps - Sit to Stand Without Arm Support  - 1 x daily - 7 x weekly - 1 sets - 10 reps - Side Stepping with Resistance at Thighs and Counter Support  - 1 x daily - 7 x weekly - 1 sets - 10 reps - Forward and Backward Step Over with Beam (BKA)  - 1 x daily - 7 x weekly - 1 sets - 10 reps - Supine ITB Stretch with Strap  - 1 x daily - 7 x  weekly - 1 sets - 3 reps - 30 sec hold   ASSESSMENT:   CLINICAL IMPRESSION:  "I feel better after doing the exercises."  The patient has variable pain in knee so modified ex's as needed and applied tape for comfort.  6 inch step ups painful but able to do 4 inch step ups painfree.      OBJECTIVE IMPAIRMENTS Abnormal gait, decreased mobility, difficulty walking, decreased ROM, decreased strength, increased muscle spasms, impaired flexibility, and pain.    ACTIVITY LIMITATIONS cleaning, community activity, driving, meal prep, occupation, laundry, yard work, and shopping.    PERSONAL FACTORS Age, Fitness, and 1-2 comorbidities: anxiety, bradycardia  are also affecting patient's functional outcome.      REHAB POTENTIAL: Good   CLINICAL DECISION MAKING: Stable/uncomplicated   EVALUATION COMPLEXITY: Low     GOALS: Goals reviewed with patient? Yes   SHORT TERM GOALS: Target date: 02/25/2022  (Remove Blue Hyperlink)   Patient will be independent with initial HEP  Baseline: Goal status: INITIAL   2.  Pain report to be no greater than 4/10  Baseline:  Goal status: INITIAL   3.  Patient to be able to demonstrate proper heel to toe progression Baseline:  Goal status: INITIAL     LONG TERM GOALS: Target date: 03/25/2022  (Remove Blue Hyperlink)   Patient to be independent with advanced HEP  Baseline:  Goal status: INITIAL   2.  Patient to report pain no greater than 2/10  Baseline:  Goal status: INITIAL   3.  Patient to be able to  ascend and descend steps safely without knee instability episodes Baseline:  Goal status: INITIAL   4.  Patient to be able to bend, stoop and squat with pain no greater than 2/10 in bilateral knees and thighs.  Baseline:  Goal status: INITIAL   5.  Patient to be able to walk 1/2 mile without need to rest.  Baseline:  Goal status: INITIAL       PLAN: PT FREQUENCY: 2x/week   PT DURATION: 8 weeks   PLANNED INTERVENTIONS: Therapeutic exercises, Therapeutic activity, Neuromuscular re-education, Balance training, Gait training, Patient/Family education, Joint mobilization, Aquatic Therapy, Dry Needling, Electrical stimulation, Cryotherapy, Moist heat, Taping, Vasopneumatic device, Ultrasound, Ionotophoresis '4mg'$ /ml Dexamethasone, and Manual therapy   PLAN FOR NEXT SESSION: Review HEP with attention to patellofemoral alignment,  see how patellar tendon taping did;   discussed ionto but pt unsure about whether OK with coumadin and heart considerations (told no steroids)  progress quad rehab and hip strength  Ruben Im, PT 02/12/22 5:21 PM Phone: [phone] Fax: [phone]

## 2022-02-14 ENCOUNTER — Ambulatory Visit: Payer: Medicare (Managed Care) | Attending: Family Medicine | Admitting: Physical Therapy

## 2022-02-14 DIAGNOSIS — M25562 Pain in left knee: Secondary | ICD-10-CM | POA: Insufficient documentation

## 2022-02-14 DIAGNOSIS — M25561 Pain in right knee: Secondary | ICD-10-CM | POA: Insufficient documentation

## 2022-02-14 DIAGNOSIS — R262 Difficulty in walking, not elsewhere classified: Secondary | ICD-10-CM | POA: Diagnosis present

## 2022-02-14 DIAGNOSIS — G8929 Other chronic pain: Secondary | ICD-10-CM | POA: Insufficient documentation

## 2022-02-14 DIAGNOSIS — R252 Cramp and spasm: Secondary | ICD-10-CM | POA: Diagnosis present

## 2022-02-14 DIAGNOSIS — M6281 Muscle weakness (generalized): Secondary | ICD-10-CM | POA: Diagnosis present

## 2022-02-14 NOTE — Therapy (Signed)
OUTPATIENT PHYSICAL THERAPY TREATMENT NOTE   Patient Name: Mercedes Thompson MRN: [id number] DOB:02-May-1946, 76 y.o., female Today's Date: 02/14/2022   REFERRING PROVIDER: Nuala Alpha, MD  END OF SESSION:   PT End of Session - 02/14/22 1146     Visit Number 5    Date for PT Re-Evaluation 03/25/22    Authorization Type [employer] Advantage    Progress Note Due on Visit 10    PT Start Time 1146    PT Stop Time 1228    PT Time Calculation (min) 42 min    Activity Tolerance Patient tolerated treatment well              Past Medical History:  Diagnosis Date   AML (acute myeloblastic leukemia) ([place]) 1987   Anxiety    Appendicitis with peritoneal abscess 03/2015   s/p ex lap at [facility]   Atrial flutter [facility]) 2016   s/p cardioversion 02/2015, previously in [place], recently taken off amiodarone   Bradycardia 2015   s/p PPM insertion   H/O mitral valve replacement with mechanical valve    coumadin   Past Surgical History:  Procedure Laterality Date   APPENDECTOMY  03/24/2015   [facility] Dr Radene Knee.  perforated appendicitis   heart ablation     MITRAL VALVE REPLACEMENT  1986   PACEMAKER INSERTION     TUBAL LIGATION     Patient Active Problem List   Diagnosis Date Noted   Appendicitis with perforation s/p lap appy 03/22/2015 10/29/2015   Intra-abdominal abscess post-lap appy July 2016 10/29/2015   Intra-abdominal abscess ([place]) 10/29/2015   Abscess of abdominal cavity ([place]) 10/29/2015   Artificial cardiac pacemaker 05/17/2015   Sinoatrial node dysfunction ([place]) 05/17/2015   Long term current use of anticoagulant 03/22/2015   Encounter for therapeutic drug level monitoring 03/07/2015   Female genuine stress incontinence 03/25/2013   Anxiety disorder 08/14/2011   Dyslipidemia 08/13/2011   Atrial fibrillation ([place]) 06/11/2011   H/O mitral valve replacement with mechanical valve 06/11/2011    REFERRING DIAG: M79.651 (ICD-10-CM) - Pain in right thigh M79.652 (ICD-10-CM) -  Pain in left thigh      THERAPY DIAG: Chronic pain of right knee   Chronic pain of left knee   Difficulty in walking, not elsewhere classified   Muscle weakness (generalized)  PERTINENT HISTORY: Patient reports about 10 years ago she started having difficulty getting out of her low car and then covid hit and she became even more sedentary.  She feels she has gradually gotten weaker and the MD feels she may just be deconditioned.  She admits she did start a new statin drug about 3 to 4 months ago.  She describes her pain as muscle aching in the quads which worsens with activity and sit to stand. She hopes to gain strength and be able to get up and down from lower surfaces with increased ease.   Ankle pain seeing the doctor next week.   PRECAUTIONS: none  SUBJECTIVE:  The knee is good.  The only thing that hurts is the sit to stand ex's.   Did 5 reps and felt on lateral side of knee.    Stepped in a hole and left ankle is bothering me.  PAIN:  Are you having pain? No but with movements will feel it NPRS scale: 0/10 Pain location: right medial and lateral knee Aggravating factors: when I first stand up  OBJECTIVE: (objective measures completed at initial evaluation unless otherwise dated)  OBJECTIVE:  DIAGNOSTIC FINDINGS: none   PATIENT SURVEYS:  FOTO 44 (goal 47)   COGNITION:           Overall cognitive status: Within functional limits for tasks assessed                          SENSATION: WFL     POSTURE:  Adducted hips, poor alignment with mod valgus knees   PALPATION: Seated flexion and extension    LE ROM:    (Blank rows = not tested)   LE MMT:   MMT Right 01/28/2022 Left 01/28/2022  Knee flexion WNL    Knee extension WN     (Blank rows = not tested)   LOWER EXTREMITY SPECIAL TESTS:  Knee special tests: Apley's compression test: negative   FUNCTIONAL TESTS:  5 times sit to stand: 13.10 Timed up and go (TUG): 8.62   GAIT: Distance walked: 50  ft Assistive device utilized: None Level of assistance: Modified independence Comments: Antalgic, slight trendelenburg    TODAY'S TREATMENT:  6/1: Discussion on the need for new shoes (neutral stability/cushioning) 2nd step hip flexor/calf stretch Heel raises 10x 6 inch step taps 20 4 inch lateral step ups 10x each way 4 inch step ups x10 (no pain) 2 inch step downs 10x each side  WB on 1 leg with 3 ways toe taps 10x each side Ball on wall hip abduction isometric 5 sec hold 10x right/left  Neuromuscular re-education to activate glutes, quads and avoid compensatory strategies Therapeutic activity: stair climbing, ascending curbs, standing, walking, sit to stand   TODAY'S TREATMENT: 02-12-22  IT band stretch in supine with strap 3 x 30 sec Supine fig 4 stretch with self overpressure 3x 30 sec Sidelying clams 10x right/left  Seated submax quad isometric 20-30% effort--pt complained of patellar tendon region pain so applied McConnell tape across tendon 4 inch step ups 2x10 (no pain) WB on right with left step over the line 10x Therapeutic activity: stair climbing, ascending curbs, standing, walking, sit to stand     TODAY'S TREATMENT: 02-06-22 NuStep x 5 min level 1 Seated LAQ 2 x 10 each both 0# Seated March x 20 0# Seated heel and toe raises x 20 Seated clam with green loop x 20 Seated hip ER 2 x 10 (bottom of foot to opposite ankle) Ball squeeze (purple) 2 x 10 Sit to stand from table with ball squeeze (from balance pad) IT band stretch in supine with strap 3 x 30 sec  TODAY'S TREATMENT: 01/31/22 Review of initial HEP with modification to side stepping with band on feet/ankles secondary to lateral knee irritation Neuromuscular re-ed: patellofemoral alignment with sit to stand and single leg Red band above knees with bent hips/knees side step; Sit to stand with attention on alignment 10x Forward step overs golf club on floor with tight touch on back of chair 10x right/left  (mirror feedback) Nu-step 5 min (lateral knee discomfort) so discontinued Therapeutic activities: sit to stand, standing, walking in forward and lateral directions; preparation for stair climbing Discussed ice massage at lateral knee and cross friction massage         PATIENT EDUCATION:  Education details: Initiated HEP Person educated: Patient Education method: Consulting civil engineer, Demonstration, Verbal cues, and Handouts Education comprehension: verbalized understanding, returned demonstration, and verbal cues required Access Code: [email or web address] URL: [email or web address] Date: 02/14/2022 Prepared by: Ruben Im  Exercises - Seated Long Arc Quad  - 1 x daily - 7 x weekly - 3  sets - 10 reps - Seated Hip Abduction  - 1 x daily - 7 x weekly - 3 sets - 10 reps - Sit to Stand Without Arm Support  - 1 x daily - 7 x weekly - 1 sets - 10 reps - Side Stepping with Resistance at Thighs and Counter Support  - 1 x daily - 7 x weekly - 1 sets - 10 reps - Forward and Backward Step Over with Beam (BKA)  - 1 x daily - 7 x weekly - 1 sets - 10 reps - Supine ITB Stretch with Strap  - 1 x daily - 7 x weekly - 1 sets - 3 reps - 30 sec hold - Seated Hip Abduction with Resistance  - 1 x daily - 7 x weekly - 2 sets - 10 reps - Seated Isometric Hip Adduction with Ball  - 1 x daily - 7 x weekly - 2 sets - 10 reps - Supine Figure 4 Piriformis Stretch  - 1 x daily - 7 x weekly - 1 sets - 3 reps - 30 hold - Clamshell  - 1 x daily - 7 x weekly - 1 sets - 10 reps - Forward Step Up  - 1 x daily - 7 x weekly - 1 sets - 10 reps - Forward Step Down  - 1 x daily - 7 x weekly - 1 sets - 10 reps - Step Taps on High Step  - 1 x daily - 7 x weekly - 1 sets - 10 reps - Single Leg 3 Way Reach with Support  - 1 x daily - 7 x weekly - 1 sets - 10 reps - Standing Isometric Hip Abduction with Ball on Wall  - 1 x daily - 7 x weekly - 1 sets - 10 reps    ASSESSMENT:   CLINICAL IMPRESSION:  The patient is able to  perform a progression of ex's including step downs (2 inches) with good quad muscle activation and without pain.  Verbal cues to stand tall with single leg activities to optimize glute medius activation.  Good response to all today.    OBJECTIVE IMPAIRMENTS Abnormal gait, decreased mobility, difficulty walking, decreased ROM, decreased strength, increased muscle spasms, impaired flexibility, and pain.    ACTIVITY LIMITATIONS cleaning, community activity, driving, meal prep, occupation, laundry, yard work, and shopping.    PERSONAL FACTORS Age, Fitness, and 1-2 comorbidities: anxiety, bradycardia  are also affecting patient's functional outcome.      REHAB POTENTIAL: Good   CLINICAL DECISION MAKING: Stable/uncomplicated   EVALUATION COMPLEXITY: Low     GOALS: Goals reviewed with patient? Yes   SHORT TERM GOALS: Target date: 02/25/2022  (Remove Blue Hyperlink)   Patient will be independent with initial HEP  Baseline: Goal status: INITIAL   2.  Pain report to be no greater than 4/10  Baseline:  Goal status: INITIAL   3.  Patient to be able to demonstrate proper heel to toe progression Baseline:  Goal status: INITIAL     LONG TERM GOALS: Target date: 03/25/2022  (Remove Blue Hyperlink)   Patient to be independent with advanced HEP  Baseline:  Goal status: INITIAL   2.  Patient to report pain no greater than 2/10  Baseline:  Goal status: INITIAL   3.  Patient to be able to ascend and descend steps safely without knee instability episodes Baseline:  Goal status: INITIAL   4.  Patient to be able to bend, stoop and squat with pain no  greater than 2/10 in bilateral knees and thighs.  Baseline:  Goal status: INITIAL   5.  Patient to be able to walk 1/2 mile without need to rest.  Baseline:  Goal status: INITIAL       PLAN: PT FREQUENCY: 2x/week   PT DURATION: 8 weeks   PLANNED INTERVENTIONS: Therapeutic exercises, Therapeutic activity, Neuromuscular re-education,  Balance training, Gait training, Patient/Family education, Joint mobilization, Aquatic Therapy, Dry Needling, Electrical stimulation, Cryotherapy, Moist heat, Taping, Vasopneumatic device, Ultrasound, Ionotophoresis '4mg'$ /ml Dexamethasone, and Manual therapy   PLAN FOR NEXT SESSION: 2 inch step downs, ball on wall hip abduction; try leg press and hip machine;  discussed ionto but pt unsure about whether OK with coumadin and heart considerations (told no steroids)  progress quad rehab and hip strength    Ruben Im, PT 02/14/22 3:02 PM Phone: [phone] Fax: [phone]

## 2022-02-15 DIAGNOSIS — Z7901 Long term (current) use of anticoagulants: Secondary | ICD-10-CM | POA: Diagnosis not present

## 2022-02-18 ENCOUNTER — Encounter: Payer: Medicare (Managed Care) | Admitting: Podiatry

## 2022-02-18 ENCOUNTER — Ambulatory Visit (INDEPENDENT_AMBULATORY_CARE_PROVIDER_SITE_OTHER): Payer: Medicare (Managed Care)

## 2022-02-18 DIAGNOSIS — S93402A Sprain of unspecified ligament of left ankle, initial encounter: Secondary | ICD-10-CM | POA: Diagnosis not present

## 2022-02-18 DIAGNOSIS — M79672 Pain in left foot: Secondary | ICD-10-CM

## 2022-02-19 ENCOUNTER — Ambulatory Visit: Payer: Medicare (Managed Care) | Admitting: Physical Therapy

## 2022-02-19 DIAGNOSIS — M25561 Pain in right knee: Secondary | ICD-10-CM | POA: Diagnosis not present

## 2022-02-19 DIAGNOSIS — G8929 Other chronic pain: Secondary | ICD-10-CM

## 2022-02-19 DIAGNOSIS — R262 Difficulty in walking, not elsewhere classified: Secondary | ICD-10-CM

## 2022-02-19 DIAGNOSIS — M6281 Muscle weakness (generalized): Secondary | ICD-10-CM

## 2022-02-19 NOTE — Therapy (Signed)
OUTPATIENT PHYSICAL THERAPY TREATMENT NOTE   Patient Name: Mercedes Thompson MRN: [id number] DOB:[birth year], 76 y.o., female Today's Date: 02/19/2022   REFERRING PROVIDER: Nuala Alpha, MD  END OF SESSION:   PT End of Session - 02/19/22 1232     Visit Number 6    Date for PT Re-Evaluation 03/25/22    Authorization Type [employer]vantage    Progress Note Due on Visit 10    PT Start Time 1232    PT Stop Time 1314    PT Time Calculation (min) 42 min    Activity Tolerance Patient tolerated treatment well              Past Medical History:  Diagnosis Date   AML (acute myeloblastic leukemia) ([place]) 1987   Anxiety    Appendicitis with peritoneal abscess 03/2015   s/p ex lap at [facility]   Atrial flutter [facility]) 2016   s/p cardioversion 02/2015, previously in [place], recently taken off amiodarone   Bradycardia 2015   s/p PPM insertion   H/O mitral valve replacement with mechanical valve    coumadin   Past Surgical History:  Procedure Laterality Date   APPENDECTOMY  03/24/2015   [facility] Dr Radene Knee.  perforated appendicitis   heart ablation     MITRAL VALVE REPLACEMENT  1986   PACEMAKER INSERTION     TUBAL LIGATION     Patient Active Problem List   Diagnosis Date Noted   Appendicitis with perforation s/p lap appy 03/22/2015 10/29/2015   Intra-abdominal abscess post-lap appy July 2016 10/29/2015   Intra-abdominal abscess ([place]) 10/29/2015   Abscess of abdominal cavity ([place]) 10/29/2015   Artificial cardiac pacemaker 05/17/2015   Sinoatrial node dysfunction ([place]) 05/17/2015   Long term current use of anticoagulant 03/22/2015   Encounter for therapeutic drug level monitoring 03/07/2015   Female genuine stress incontinence 03/25/2013   Anxiety disorder 08/14/2011   Dyslipidemia 08/13/2011   Atrial fibrillation ([place]) 06/11/2011   H/O mitral valve replacement with mechanical valve 06/11/2011    REFERRING DIAG: M79.651 (ICD-10-CM) - Pain in right thigh M79.652 (ICD-10-CM) -  Pain in left thigh      THERAPY DIAG: Chronic pain of right knee   Chronic pain of left knee   Difficulty in walking, not elsewhere classified   Muscle weakness (generalized)  PERTINENT HISTORY: Patient reports about 10 years ago she started having difficulty getting out of her low car and then covid hit and she became even more sedentary.  She feels she has gradually gotten weaker and the MD feels she may just be deconditioned.  She admits she did start a new statin drug about 3 to 4 months ago.  She describes her pain as muscle aching in the quads which worsens with activity and sit to stand. She hopes to gain strength and be able to get up and down from lower surfaces with increased ease.   Ankle pain seeing the doctor next week.   PRECAUTIONS: none  SUBJECTIVE:  3 weeks in walking boot for ligament.  I was really sore after last visit for 3 days.  Doesn't hurt much today.      PAIN:  Are you having pain? No but with movements will feel it NPRS scale: 0/10 Pain location: right medial and lateral knee Aggravating factors: when I first stand up  OBJECTIVE: (objective measures completed at initial evaluation unless otherwise dated)  OBJECTIVE:    DIAGNOSTIC FINDINGS: none   PATIENT SURVEYS:  FOTO 44 (goal 62)  COGNITION:           Overall cognitive status: Within functional limits for tasks assessed                          SENSATION: WFL     POSTURE:  Adducted hips, poor alignment with mod valgus knees   PALPATION: Seated flexion and extension    LE ROM:    (Blank rows = not tested)   LE MMT:   MMT Right 01/28/2022 Left 01/28/2022  Knee flexion WNL    Knee extension WN     (Blank rows = not tested)   LOWER EXTREMITY SPECIAL TESTS:  Knee special tests: Apley's compression test: negative   FUNCTIONAL TESTS:  5 times sit to stand: 13.10 Timed up and go (TUG): 8.62   GAIT: Distance walked: 50 ft Assistive device utilized: None Level of assistance:  Modified independence Comments: Antalgic, slight trendelenburg    TODAY'S TREATMENT:  6/6: 2nd step hip flexor/calf stretch and HS stretch right/left  6 inch step taps 20 2 inch lateral step ups 10x each way 4 inch step ups x10 (no pain) 2 inch step downs 10x each side  WB on 1 leg with 3 ways toe taps 10x each side Ball on wall hip abduction isometric 5 sec hold 10x right/left  Hip extension on wall 5 sec hold 5x right/left  Hip machine 40# hip abduction and extension 10x each right/left  Leg press 60# 20x (less pain with ball squeeze) Neuromuscular re-education to activate glutes, quads and avoid compensatory strategies Therapeutic activity: stair climbing, ascending curbs, standing, walking, sit to stand       6/1: Discussion on the need for new shoes (neutral stability/cushioning) 2nd step hip flexor/calf stretch Heel raises 10x 6 inch step taps 20 4 inch lateral step ups 10x each way 4 inch step ups x10 (no pain) 2 inch step downs 10x each side  WB on 1 leg with 3 ways toe taps 10x each side Ball on wall hip abduction isometric 5 sec hold 10x right/left  Neuromuscular re-education to activate glutes, quads and avoid compensatory strategies Therapeutic activity: stair climbing, ascending curbs, standing, walking, sit to stand   TODAY'S TREATMENT: 02-12-22  IT band stretch in supine with strap 3 x 30 sec Supine fig 4 stretch with self overpressure 3x 30 sec Sidelying clams 10x right/left  Seated submax quad isometric 20-30% effort--pt complained of patellar tendon region pain so applied McConnell tape across tendon 4 inch step ups 2x10 (no pain) WB on right with left step over the line 10x Therapeutic activity: stair climbing, ascending curbs, standing, walking, sit to stand          PATIENT EDUCATION:  Access Code: [email or web address] URL: [email or web address] Date: 02/19/2022 Prepared by: Ruben Im  Exercises - Seated Long Arc Quad  - 1 x daily -  7 x weekly - 3 sets - 10 reps - Seated Hip Abduction  - 1 x daily - 7 x weekly - 3 sets - 10 reps - Sit to Stand Without Arm Support  - 1 x daily - 7 x weekly - 1 sets - 10 reps - Side Stepping with Resistance at Thighs and Counter Support  - 1 x daily - 7 x weekly - 1 sets - 10 reps - Forward and Backward Step Over with Beam (BKA)  - 1 x daily - 7 x weekly - 1 sets - 10 reps - Supine ITB Stretch with Strap  -  1 x daily - 7 x weekly - 1 sets - 3 reps - 30 sec hold - Seated Hip Abduction with Resistance  - 1 x daily - 7 x weekly - 2 sets - 10 reps - Seated Isometric Hip Adduction with Ball  - 1 x daily - 7 x weekly - 2 sets - 10 reps - Supine Figure 4 Piriformis Stretch  - 1 x daily - 7 x weekly - 1 sets - 3 reps - 30 hold - Clamshell  - 1 x daily - 7 x weekly - 1 sets - 10 reps - Forward Step Up  - 1 x daily - 7 x weekly - 1 sets - 10 reps - Forward Step Down  - 1 x daily - 7 x weekly - 1 sets - 10 reps - Step Taps on High Step  - 1 x daily - 7 x weekly - 1 sets - 10 reps - Single Leg 3 Way Reach with Support  - 1 x daily - 7 x weekly - 1 sets - 10 reps - Standing Isometric Hip Abduction with Ball on Wall  - 1 x daily - 7 x weekly - 1 sets - 10 reps - Standing Hamstring Stretch with Step  - 1 x daily - 7 x weekly - 1 sets - 10 reps    ASSESSMENT:   CLINICAL IMPRESSION:  Compensatory over reliance on ITB/lateral knee tissue.  Discussed goal of strengthening glutes and quads to decrease the overuse in this area.  Therapist monitoring response and modifying as needed based on pain (ball squeeze helps and decreased step height lateral step up).   Modifications needed for boot as well.      OBJECTIVE IMPAIRMENTS Abnormal gait, decreased mobility, difficulty walking, decreased ROM, decreased strength, increased muscle spasms, impaired flexibility, and pain.    ACTIVITY LIMITATIONS cleaning, community activity, driving, meal prep, occupation, laundry, yard work, and shopping.    PERSONAL FACTORS  Age, Fitness, and 1-2 comorbidities: anxiety, bradycardia  are also affecting patient's functional outcome.      REHAB POTENTIAL: Good   CLINICAL DECISION MAKING: Stable/uncomplicated   EVALUATION COMPLEXITY: Low     GOALS: Goals reviewed with patient? Yes   SHORT TERM GOALS: Target date: 02/25/2022  (Remove Blue Hyperlink)   Patient will be independent with initial HEP  Baseline: Goal status: INITIAL   2.  Pain report to be no greater than 4/10  Baseline:  Goal status: INITIAL   3.  Patient to be able to demonstrate proper heel to toe progression Baseline:  Goal status: INITIAL     LONG TERM GOALS: Target date: 03/25/2022  (Remove Blue Hyperlink)   Patient to be independent with advanced HEP  Baseline:  Goal status: INITIAL   2.  Patient to report pain no greater than 2/10  Baseline:  Goal status: INITIAL   3.  Patient to be able to ascend and descend steps safely without knee instability episodes Baseline:  Goal status: INITIAL   4.  Patient to be able to bend, stoop and squat with pain no greater than 2/10 in bilateral knees and thighs.  Baseline:  Goal status: INITIAL   5.  Patient to be able to walk 1/2 mile without need to rest.  Baseline:  Goal status: INITIAL       PLAN: PT FREQUENCY: 2x/week   PT DURATION: 8 weeks   PLANNED INTERVENTIONS: Therapeutic exercises, Therapeutic activity, Neuromuscular re-education, Balance training, Gait training, Patient/Family education, Joint mobilization, Aquatic Therapy,  Dry Needling, Electrical stimulation, Cryotherapy, Moist heat, Taping, Vasopneumatic device, Ultrasound, Ionotophoresis '4mg'$ /ml Dexamethasone, and Manual therapy   PLAN FOR NEXT SESSION:check STGS next week; 2 inch step downs, ball on wall hip abduction;  leg press  with ball squeeze ;hip machine;  discussed ionto but pt unsure about whether OK with coumadin and heart considerations (told no steroids)  progress quad rehab and hip strength  Ruben Im, PT 02/19/22 1:13 PM Phone: [phone] Fax: [phone]

## 2022-02-20 DIAGNOSIS — E785 Hyperlipidemia, unspecified: Secondary | ICD-10-CM | POA: Diagnosis not present

## 2022-02-20 DIAGNOSIS — M791 Myalgia, unspecified site: Secondary | ICD-10-CM | POA: Diagnosis not present

## 2022-02-20 DIAGNOSIS — Z5181 Encounter for therapeutic drug level monitoring: Secondary | ICD-10-CM | POA: Diagnosis not present

## 2022-02-26 ENCOUNTER — Ambulatory Visit: Payer: Medicare (Managed Care)

## 2022-02-26 DIAGNOSIS — R262 Difficulty in walking, not elsewhere classified: Secondary | ICD-10-CM

## 2022-02-26 DIAGNOSIS — G8929 Other chronic pain: Secondary | ICD-10-CM

## 2022-02-26 DIAGNOSIS — M6281 Muscle weakness (generalized): Secondary | ICD-10-CM

## 2022-02-26 DIAGNOSIS — M25561 Pain in right knee: Secondary | ICD-10-CM | POA: Diagnosis not present

## 2022-02-26 NOTE — Therapy (Signed)
OUTPATIENT PHYSICAL THERAPY TREATMENT NOTE   Patient Name: Mercedes Thompson MRN: [id number] DOB:September 20, 1945, 76 y.o., female Today's Date: 02/26/2022   REFERRING PROVIDER: Nuala Alpha, MD  END OF SESSION:   PT End of Session - 02/26/22 1409     Visit Number 7    Date for PT Re-Evaluation 03/25/22    Authorization Type [employer] Advantage    Progress Note Due on Visit 10    PT Start Time 1400    PT Stop Time 9892    PT Time Calculation (min) 45 min    Activity Tolerance Patient tolerated treatment well    Behavior During Therapy WFL for tasks assessed/performed              Past Medical History:  Diagnosis Date   AML (acute myeloblastic leukemia) ([place]) 1987   Anxiety    Appendicitis with peritoneal abscess 03/2015   s/p ex lap [facility]   Atrial flutter [facility]) 2016   s/p cardioversion 02/2015, previously in [place], recently taken off amiodarone   Bradycardia 2015   s/p PPM insertion   H/O mitral valve replacement with mechanical valve    coumadin   Past Surgical History:  Procedure Laterality Date   APPENDECTOMY  03/24/2015   [facility] Dr Radene Knee.  perforated appendicitis   heart ablation     MITRAL VALVE REPLACEMENT  1986   PACEMAKER INSERTION     TUBAL LIGATION     Patient Active Problem List   Diagnosis Date Noted   Appendicitis with perforation s/p lap appy 03/22/2015 10/29/2015   Intra-abdominal abscess post-lap appy July 2016 10/29/2015   Intra-abdominal abscess ([place]) 10/29/2015   Abscess of abdominal cavity ([place]) 10/29/2015   Artificial cardiac pacemaker 05/17/2015   Sinoatrial node dysfunction ([place]) 05/17/2015   Long term current use of anticoagulant 03/22/2015   Encounter for therapeutic drug level monitoring 03/07/2015   Female genuine stress incontinence 03/25/2013   Anxiety disorder 08/14/2011   Dyslipidemia 08/13/2011   Atrial fibrillation ([place]) 06/11/2011   H/O mitral valve replacement with mechanical valve 06/11/2011    REFERRING DIAG:  M79.651 (ICD-10-CM) - Pain in right thigh M79.652 (ICD-10-CM) - Pain in left thigh      THERAPY DIAG: Chronic pain of right knee   Chronic pain of left knee   Difficulty in walking, not elsewhere classified   Muscle weakness (generalized)  PERTINENT HISTORY: Patient reports about 10 years ago she started having difficulty getting out of her low car and then covid hit and she became even more sedentary.  She feels she has gradually gotten weaker and the MD feels she may just be deconditioned.  She admits she did start a new statin drug about 3 to 4 months ago.  She describes her pain as muscle aching in the quads which worsens with activity and sit to stand. She hopes to gain strength and be able to get up and down from lower surfaces with increased ease.   Ankle pain seeing the doctor next week.   PRECAUTIONS: none  SUBJECTIVE: Patient reports she noticed over the weekend that she was not having pain on several occasions when she was standing up.     PAIN:  Are you having pain? No but with movements will feel it NPRS scale: 4/10 Pain location: right medial and lateral knee Aggravating factors: when I first stand up  OBJECTIVE: (objective measures completed at initial evaluation unless otherwise dated)  OBJECTIVE:    DIAGNOSTIC FINDINGS: none   PATIENT SURVEYS:  FOTO 44 (goal 73)   COGNITION:           Overall cognitive status: Within functional limits for tasks assessed                          SENSATION: WFL     POSTURE:  Adducted hips, poor alignment with mod valgus knees   PALPATION: Seated flexion and extension    LE ROM:    (Blank rows = not tested)   LE MMT:   MMT Right 01/28/2022 Left 01/28/2022  Knee flexion WNL    Knee extension WN     (Blank rows = not tested)   LOWER EXTREMITY SPECIAL TESTS:  Knee special tests: Apley's compression test: negative   FUNCTIONAL TESTS:  5 times sit to stand: 13.10 Timed up and go (TUG): 8.62   GAIT: Distance  walked: 50 ft Assistive device utilized: None Level of assistance: Modified independence Comments: Antalgic, slight trendelenburg    TODAY'S TREATMENT:  6/13: 2nd step hip flexor/calf stretch and HS stretch right/left  6 inch step taps 20 2 inch lateral step ups 10x each way 4 inch step ups x10 (no pain) 2 inch step downs 10x each side  Lateral band walks x 5 laps at bar WB on 1 leg with 3 ways toe taps 10x each side with vc's for proper alignment when standing on right tapping left Ball on wall hip abduction isometric 5 sec hold 10x right/left  Hip extension ball on wall 5 sec hold 5x right/left UE support on chair Hip machine 40# hip abduction and extension 15 x each right/left  Leg press 60# 20x with ball squeeze Seated LAQ with 4 lb ankle weights 2 x 10 each Seated marching x 20 with 4 lb ankle Neuromuscular re-education to activate glutes, quads and avoid compensatory strategies:  had patient standing in front of mirror to demonst IT band stretch in supine x 3 hold 30 sec   TODAY'S TREATMENT:  6/6: 2nd step hip flexor/calf stretch and HS stretch right/left  6 inch step taps 20 2 inch lateral step ups 10x each way 4 inch step ups x10 (no pain) 2 inch step downs 10x each side  WB on 1 leg with 3 ways toe taps 10x each side Ball on wall hip abduction isometric 5 sec hold 10x right/left  Hip extension on wall 5 sec hold 5x right/left  Hip machine 40# hip abduction and extension 10x each right/left  Leg press 60# 20x (less pain with ball squeeze) Neuromuscular re-education to activate glutes, quads and avoid compensatory strategies Therapeutic activity: stair climbing, ascending curbs, standing, walking, sit to stand       6/1: Discussion on the need for new shoes (neutral stability/cushioning) 2nd step hip flexor/calf stretch Heel raises 10x 6 inch step taps 20 4 inch lateral step ups 10x each way 4 inch step ups x10 (no pain) 2 inch step downs 10x each side  WB  on 1 leg with 3 ways toe taps 10x each side Ball on wall hip abduction isometric 5 sec hold 10x right/left  Neuromuscular re-education to activate glutes, quads and avoid compensatory strategies Therapeutic activity: stair climbing, ascending curbs, standing, walking, sit to stand   TODAY'S TREATMENT: 02-12-22  IT band stretch in supine with strap 3 x 30 sec Supine fig 4 stretch with self overpressure 3x 30 sec Sidelying clams 10x right/left  Seated submax quad isometric 20-30% effort--pt complained of patellar tendon region pain  so applied McConnell tape across tendon 4 inch step ups 2x10 (no pain) WB on right with left step over the line 10x Therapeutic activity: stair climbing, ascending curbs, standing, walking, sit to stand          PATIENT EDUCATION:  Access Code: [email or web address] URL: [email or web address] Date: 02/19/2022 Prepared by: Ruben Im  Exercises - Seated Long Arc Quad  - 1 x daily - 7 x weekly - 3 sets - 10 reps - Seated Hip Abduction  - 1 x daily - 7 x weekly - 3 sets - 10 reps - Sit to Stand Without Arm Support  - 1 x daily - 7 x weekly - 1 sets - 10 reps - Side Stepping with Resistance at Thighs and Counter Support  - 1 x daily - 7 x weekly - 1 sets - 10 reps - Forward and Backward Step Over with Beam (BKA)  - 1 x daily - 7 x weekly - 1 sets - 10 reps - Supine ITB Stretch with Strap  - 1 x daily - 7 x weekly - 1 sets - 3 reps - 30 sec hold - Seated Hip Abduction with Resistance  - 1 x daily - 7 x weekly - 2 sets - 10 reps - Seated Isometric Hip Adduction with Ball  - 1 x daily - 7 x weekly - 2 sets - 10 reps - Supine Figure 4 Piriformis Stretch  - 1 x daily - 7 x weekly - 1 sets - 3 reps - 30 hold - Clamshell  - 1 x daily - 7 x weekly - 1 sets - 10 reps - Forward Step Up  - 1 x daily - 7 x weekly - 1 sets - 10 reps - Forward Step Down  - 1 x daily - 7 x weekly - 1 sets - 10 reps - Step Taps on High Step  - 1 x daily - 7 x weekly - 1 sets - 10  reps - Single Leg 3 Way Reach with Support  - 1 x daily - 7 x weekly - 1 sets - 10 reps - Standing Isometric Hip Abduction with Ball on Wall  - 1 x daily - 7 x weekly - 1 sets - 10 reps - Standing Hamstring Stretch with Step  - 1 x daily - 7 x weekly - 1 sets - 10 reps    ASSESSMENT:   CLINICAL IMPRESSION:  Patient continues to have lateral knee pain at times.  However, symptoms are more intermittent now.  She fatigues easily but is able to tolerate increased resistance and reps on several exercises.  She is also beginning to understand alignment issues and why the distal IT band becomes easily irritated.  She benefit from continued skilled PT for quad rehab and alignment training.     OBJECTIVE IMPAIRMENTS Abnormal gait, decreased mobility, difficulty walking, decreased ROM, decreased strength, increased muscle spasms, impaired flexibility, and pain.    ACTIVITY LIMITATIONS cleaning, community activity, driving, meal prep, occupation, laundry, yard work, and shopping.    PERSONAL FACTORS Age, Fitness, and 1-2 comorbidities: anxiety, bradycardia  are also affecting patient's functional outcome.      REHAB POTENTIAL: Good   CLINICAL DECISION MAKING: Stable/uncomplicated   EVALUATION COMPLEXITY: Low     GOALS: Goals reviewed with patient? Yes   SHORT TERM GOALS: Target date: 02/25/2022  (Remove Blue Hyperlink)   Patient will be independent with initial HEP  Baseline: Goal status: MET   2.  Pain report to be  no greater than 4/10  Baseline:  Goal status: ongoing   3.  Patient to be able to demonstrate proper heel to toe progression Baseline:  Goal status: ongoing     LONG TERM GOALS: Target date: 03/25/2022  (Remove Blue Hyperlink)   Patient to be independent with advanced HEP  Baseline:  Goal status: INITIAL   2.  Patient to report pain no greater than 2/10  Baseline:  Goal status: INITIAL   3.  Patient to be able to ascend and descend steps safely without knee  instability episodes Baseline:  Goal status: INITIAL   4.  Patient to be able to bend, stoop and squat with pain no greater than 2/10 in bilateral knees and thighs.  Baseline:  Goal status: INITIAL   5.  Patient to be able to walk 1/2 mile without need to rest.  Baseline:  Goal status: INITIAL       PLAN: PT FREQUENCY: 2x/week   PT DURATION: 8 weeks   PLANNED INTERVENTIONS: Therapeutic exercises, Therapeutic activity, Neuromuscular re-education, Balance training, Gait training, Patient/Family education, Joint mobilization, Aquatic Therapy, Dry Needling, Electrical stimulation, Cryotherapy, Moist heat, Taping, Vasopneumatic device, Ultrasound, Ionotophoresis 4mg /ml Dexamethasone, and Manual therapy   PLAN FOR NEXT SESSION: Nustep, 2 inch step downs, ball on wall hip abduction;  leg press  with ball squeeze ;hip machine;  discussed ionto but pt unsure about whether OK with coumadin and heart considerations (told no steroids)  progress quad rehab and hip strength  Enid Maultsby B. Aimee Heldman, PT 02/26/22 4:51 PM  [facility] [address], [place], [place] [address] Phone # [phone] Fax [phone]

## 2022-02-26 NOTE — Progress Notes (Addendum)
   Subjective:  76 y.o. female presenting today as a new patient for evaluation of left ankle pain that has been chronic for the last 3-4 weeks.  She states that she sustained an ankle sprain and rolled her ankle about 3-4 weeks ago.  Today she only rates the pain 1/10.  There has been some improvement over the last few weeks.  She presents for further treatment and evaluation   Past Medical History:  Diagnosis Date   AML (acute myeloblastic leukemia) ([place]) 1987   Anxiety    Appendicitis with peritoneal abscess 03/2015   s/[facility] lap at [facility]   Atrial flutter [facility]) 2016   s/p cardioversion 02/2015, previously in [place], recently taken off amiodarone   Bradycardia 2015   s/p PPM insertion   H/O mitral valve replacement with mechanical valve    coumadin   Past Surgical History:  Procedure Laterality Date   APPENDECTOMY  03/24/2015   [facility] Dr Rad[facility] K[facility].  perforated appendicitis   heart ablation     MITRAL VALVE REPLACEMENT  1986   PACEMAKER INSERTION     TUBAL LIGATION     Allergies  Allergen Reactions   Bupropion Nausea Only    Other reaction(s): Unknown    Paroxetine Hcl Nausea Only    Other reaction(s): Unknown   Sertraline Nausea Only    Other reaction(s): Unknown   Sulfa Antibiotics     AFFECTS COUMADIN   Decongest-Aid [Pseudoephedrine] Palpitations     Objective / Physical Exam:  General:  The patient is alert and oriented x3 in no acute distress. Dermatology:  Skin is warm, dry and supple bilateral lower extremities. Negative for open lesions or macerations. Vascular:  Palpable pedal pulses bilaterally. No edema or erythema noted. Capillary refill within normal limits. Neurological:  Epicritic and protective threshold grossly intact bilaterally.  Musculoskeletal Exam:  There is a very mild pain on palpation to the lateral aspect of the left ankle.  Negative for any pain with range of motion.  Range of motion within normal limits to all pedal and ankle joints  bilateral. Muscle strength 5/5 in all groups bilateral.  Radiographic Exam LT ankle 02/18/2022:  Normal osseous mineralization. Joint spaces preserved. No fracture/dislocation/boney destruction.    Assessment: 1.  Left lateral ankle sprain x3-4 weeks  Plan of Care:  1. Patient was evaluated. X-Rays reviewed.  2.  Patient declined injection today.  Patient states that injections put her in A-fib 3.  Cam boot dispensed.  Weightbearing as tolerated 4.  No NSAIDs prescribed.  Patient on Coumadin  5.  Recommend OTC topical Voltaren gel as needed 6.  Return to clinic as needed   Edrick Kins, DPM [facility]  Dr. Edrick Kins, [place]                                        [place], [place] [address]                Office [phone]  Fax [phone]

## 2022-02-28 ENCOUNTER — Ambulatory Visit: Payer: Medicare (Managed Care)

## 2022-02-28 DIAGNOSIS — M25561 Pain in right knee: Secondary | ICD-10-CM | POA: Diagnosis not present

## 2022-02-28 DIAGNOSIS — G8929 Other chronic pain: Secondary | ICD-10-CM

## 2022-02-28 DIAGNOSIS — R262 Difficulty in walking, not elsewhere classified: Secondary | ICD-10-CM

## 2022-02-28 DIAGNOSIS — M6281 Muscle weakness (generalized): Secondary | ICD-10-CM

## 2022-02-28 NOTE — Therapy (Signed)
OUTPATIENT PHYSICAL THERAPY TREATMENT NOTE   Patient Name: Mercedes Thompson MRN: [id number] DOB:July 08, 1946, 76 y.o., female Today's Date: 02/28/2022   REFERRING PROVIDER: Nuala Alpha, MD  END OF SESSION:   PT End of Session - 02/28/22 1152     Visit Number 8    Date for PT Re-Evaluation 03/25/22    Authorization Type [employer]vantage    Progress Note Due on Visit 10    PT Start Time 1145    PT Stop Time 1230    PT Time Calculation (min) 45 min    Activity Tolerance Patient tolerated treatment well    Behavior During Therapy WFL for tasks assessed/performed              Past Medical History:  Diagnosis Date   AML (acute myeloblastic leukemia) ([place]) 1987   Anxiety    Appendicitis with peritoneal abscess 03/2015   s/p ex lap at [facility]   Atrial flutter [facility]) 2016   s/p cardioversion 02/2015, previously in [place], recently taken off amiodarone   Bradycardia 2015   s/p PPM insertion   H/O mitral valve replacement with mechanical valve    coumadin   Past Surgical History:  Procedure Laterality Date   APPENDECTOMY  03/24/2015   [facility] Dr Radene Knee.  perforated appendicitis   heart ablation     MITRAL VALVE REPLACEMENT  1986   PACEMAKER INSERTION     TUBAL LIGATION     Patient Active Problem List   Diagnosis Date Noted   Appendicitis with perforation s/p lap appy 03/22/2015 10/29/2015   Intra-abdominal abscess post-lap appy July 2016 10/29/2015   Intra-abdominal abscess ([place]) 10/29/2015   Abscess of abdominal cavity ([place]) 10/29/2015   Artificial cardiac pacemaker 05/17/2015   Sinoatrial node dysfunction ([place]) 05/17/2015   Long term current use of anticoagulant 03/22/2015   Encounter for therapeutic drug level monitoring 03/07/2015   Female genuine stress incontinence 03/25/2013   Anxiety disorder 08/14/2011   Dyslipidemia 08/13/2011   Atrial fibrillation ([place]) 06/11/2011   H/O mitral valve replacement with mechanical valve 06/11/2011    REFERRING DIAG:  M79.651 (ICD-10-CM) - Pain in right thigh M79.652 (ICD-10-CM) - Pain in left thigh      THERAPY DIAG: Chronic pain of right knee   Chronic pain of left knee   Difficulty in walking, not elsewhere classified   Muscle weakness (generalized)  PERTINENT HISTORY: Patient reports about 10 years ago she started having difficulty getting out of her low car and then covid hit and she became even more sedentary.  She feels she has gradually gotten weaker and the MD feels she may just be deconditioned.  She admits she did start a new statin drug about 3 to 4 months ago.  She describes her pain as muscle aching in the quads which worsens with activity and sit to stand. She hopes to gain strength and be able to get up and down from lower surfaces with increased ease.   Ankle pain seeing the doctor next week.   PRECAUTIONS: none  SUBJECTIVE: Patient reports she is feeling great.  She is noticing she doesn't have pain when trying to get out of a chair.      PAIN:  Are you having pain? No but with movements will feel it NPRS scale: 0/10 Pain location: right medial and lateral knee Aggravating factors: when I first stand up  OBJECTIVE: (objective measures completed at initial evaluation unless otherwise dated)  OBJECTIVE:    DIAGNOSTIC FINDINGS: none   PATIENT  SURVEYS:  FOTO 44 (goal 73)   COGNITION:           Overall cognitive status: Within functional limits for tasks assessed                          SENSATION: WFL     POSTURE:  Adducted hips, poor alignment with mod valgus knees   PALPATION: Seated flexion and extension    LE ROM:    (Blank rows = not tested)   LE MMT:   MMT Right 01/28/2022 Left 01/28/2022  Knee flexion WNL    Knee extension WN     (Blank rows = not tested)   LOWER EXTREMITY SPECIAL TESTS:  Knee special tests: Apley's compression test: negative   FUNCTIONAL TESTS:  5 times sit to stand: 13.10 Timed up and go (TUG): 8.62   GAIT: Distance walked:  50 ft Assistive device utilized: None Level of assistance: Modified independence Comments: Antalgic, slight trendelenburg    TODAY'S TREATMENT:  6/15: NuStep x 5 min level 3 6 inch step taps 20 4 inch lateral step ups 10x each way 4 inch step ups x10 (no pain) 2 inch step downs 10x each side  2nd step hip flexor/calf stretch and HS stretch right/left  Lateral band walks x 5 laps at bar (yellow loop) WB on 1 leg with 3 ways toe taps 10x each side with vc's for proper alignment when standing on right tapping left (added yellow loop around knees) Ball on wall hip abduction isometric 5 sec hold 10x right/left  Hip extension ball on wall 5 sec hold 5x right/left UE support on chair Hip machine 40# hip abduction and extension 15 x each right/left  Leg press 60# 20x with ball squeeze Seated LAQ with 4 lb ankle weights 2 x 10 each Seated marching x 20 with 4 lb ankle Neuromuscular re-education to activate glutes, quads and avoid compensatory strategies:  had patient standing in front of mirror to demonstrate proper alignment and how to achieve this IT band stretch in supine x 3 hold 30 sec  6/13: 2nd step hip flexor/calf stretch and HS stretch right/left  6 inch step taps 20 2 inch lateral step ups 10x each way 4 inch step ups x10 (no pain) 2 inch step downs 10x each side  Lateral band walks x 5 laps at bar WB on 1 leg with 3 ways toe taps 10x each side with vc's for proper alignment when standing on right tapping left Ball on wall hip abduction isometric 5 sec hold 10x right/left  Hip extension ball on wall 5 sec hold 5x right/left UE support on chair Hip machine 40# hip abduction and extension 15 x each right/left  Leg press 60# 20x with ball squeeze Seated LAQ with 4 lb ankle weights 2 x 10 each Seated marching x 20 with 4 lb ankle Neuromuscular re-education to activate glutes, quads and avoid compensatory strategies:  had patient standing in front of mirror to demonst IT band  stretch in supine x 3 hold 30 sec   TODAY'S TREATMENT:  6/6: 2nd step hip flexor/calf stretch and HS stretch right/left  6 inch step taps 20 2 inch lateral step ups 10x each way 4 inch step ups x10 (no pain) 2 inch step downs 10x each side  WB on 1 leg with 3 ways toe taps 10x each side Ball on wall hip abduction isometric 5 sec hold 10x right/left  Hip extension on wall 5  sec hold 5x right/left  Hip machine 40# hip abduction and extension 10x each right/left  Leg press 60# 20x (less pain with ball squeeze) Neuromuscular re-education to activate glutes, quads and avoid compensatory strategies Therapeutic activity: stair climbing, ascending curbs, standing, walking, sit to stand   PATIENT EDUCATION:  Access Code: [email or web address] URL: [email or web address] Date: 02/19/2022 Prepared by: Ruben Im  Exercises - Seated Long Arc Quad  - 1 x daily - 7 x weekly - 3 sets - 10 reps - Seated Hip Abduction  - 1 x daily - 7 x weekly - 3 sets - 10 reps - Sit to Stand Without Arm Support  - 1 x daily - 7 x weekly - 1 sets - 10 reps - Side Stepping with Resistance at Thighs and Counter Support  - 1 x daily - 7 x weekly - 1 sets - 10 reps - Forward and Backward Step Over with Beam (BKA)  - 1 x daily - 7 x weekly - 1 sets - 10 reps - Supine ITB Stretch with Strap  - 1 x daily - 7 x weekly - 1 sets - 3 reps - 30 sec hold - Seated Hip Abduction with Resistance  - 1 x daily - 7 x weekly - 2 sets - 10 reps - Seated Isometric Hip Adduction with Ball  - 1 x daily - 7 x weekly - 2 sets - 10 reps - Supine Figure 4 Piriformis Stretch  - 1 x daily - 7 x weekly - 1 sets - 3 reps - 30 hold - Clamshell  - 1 x daily - 7 x weekly - 1 sets - 10 reps - Forward Step Up  - 1 x daily - 7 x weekly - 1 sets - 10 reps - Forward Step Down  - 1 x daily - 7 x weekly - 1 sets - 10 reps - Step Taps on High Step  - 1 x daily - 7 x weekly - 1 sets - 10 reps - Single Leg 3 Way Reach with Support  - 1 x daily - 7 x  weekly - 1 sets - 10 reps - Standing Isometric Hip Abduction with Ball on Wall  - 1 x daily - 7 x weekly - 1 sets - 10 reps - Standing Hamstring Stretch with Step  - 1 x daily - 7 x weekly - 1 sets - 10 reps    ASSESSMENT:   CLINICAL IMPRESSION:  Lateral knee pain diminishing with daily functional activity.  Symptoms are more intermittent now and typically with rehab.  She was less fatigued today, tolerating more before needing to rest.  Improving understanding of alignment issues and why the distal IT band becomes easily irritated.  She benefit from continued skilled PT for quad rehab and alignment training.     OBJECTIVE IMPAIRMENTS Abnormal gait, decreased mobility, difficulty walking, decreased ROM, decreased strength, increased muscle spasms, impaired flexibility, and pain.    ACTIVITY LIMITATIONS cleaning, community activity, driving, meal prep, occupation, laundry, yard work, and shopping.    PERSONAL FACTORS Age, Fitness, and 1-2 comorbidities: anxiety, bradycardia  are also affecting patient's functional outcome.      REHAB POTENTIAL: Good   CLINICAL DECISION MAKING: Stable/uncomplicated   EVALUATION COMPLEXITY: Low     GOALS: Goals reviewed with patient? Yes   SHORT TERM GOALS: Target date: 02/25/2022  (Remove Blue Hyperlink)   Patient will be independent with initial HEP  Baseline: Goal status: MET   2.  Pain report to be  no greater than 4/10  Baseline:  Goal status: MET   3.  Patient to be able to demonstrate proper heel to toe progression Baseline:  Goal status: MET     LONG TERM GOALS: Target date: 03/25/2022  (Remove Blue Hyperlink)   Patient to be independent with advanced HEP  Baseline:  Goal status: INITIAL   2.  Patient to report pain no greater than 2/10  Baseline:  Goal status: INITIAL   3.  Patient to be able to ascend and descend steps safely without knee instability episodes Baseline:  Goal status: INITIAL   4.  Patient to be able to bend,  stoop and squat with pain no greater than 2/10 in bilateral knees and thighs.  Baseline:  Goal status: INITIAL   5.  Patient to be able to walk 1/2 mile without need to rest.  Baseline:  Goal status: INITIAL       PLAN: PT FREQUENCY: 2x/week   PT DURATION: 8 weeks   PLANNED INTERVENTIONS: Therapeutic exercises, Therapeutic activity, Neuromuscular re-education, Balance training, Gait training, Patient/Family education, Joint mobilization, Aquatic Therapy, Dry Needling, Electrical stimulation, Cryotherapy, Moist heat, Taping, Vasopneumatic device, Ultrasound, Ionotophoresis 4mg /ml Dexamethasone, and Manual therapy   PLAN FOR NEXT SESSION: Nustep, 2 inch step downs, ball on wall hip abduction;  leg press  with ball squeeze ;hip machine;  discussed ionto but pt unsure about whether OK with coumadin and heart considerations (told no steroids)  progress quad rehab and hip strength  Ciaran Begay B. Ahmad Vanwey, PT 02/28/22 1:26 PM   [facility] [address] [place] [address] [place], [place] [address] Phone # [phone] Fax [phone]

## 2022-03-04 NOTE — Addendum Note (Signed)
Addended by: Edrick Kins on: 03/04/2022 06:57 AM   Modules accepted: Level of Service

## 2022-03-05 ENCOUNTER — Ambulatory Visit: Payer: Medicare (Managed Care) | Admitting: Physical Therapy

## 2022-03-05 ENCOUNTER — Telehealth: Payer: Self-pay | Admitting: Physical Therapy

## 2022-03-05 NOTE — Telephone Encounter (Signed)
Forgot appt.  Will be here for next appt on 6/22.

## 2022-03-07 ENCOUNTER — Ambulatory Visit: Payer: Medicare (Managed Care) | Admitting: Physical Therapy

## 2022-03-07 DIAGNOSIS — R262 Difficulty in walking, not elsewhere classified: Secondary | ICD-10-CM

## 2022-03-07 DIAGNOSIS — M25561 Pain in right knee: Secondary | ICD-10-CM | POA: Diagnosis not present

## 2022-03-07 DIAGNOSIS — M6281 Muscle weakness (generalized): Secondary | ICD-10-CM

## 2022-03-07 DIAGNOSIS — G8929 Other chronic pain: Secondary | ICD-10-CM

## 2022-03-07 NOTE — Therapy (Signed)
OUTPATIENT PHYSICAL THERAPY TREATMENT NOTE   Patient Name: Mercedes Thompson MRN: [id number] DOB:08-04-1946, 76 y.o., female Today's Date: 03/07/2022   REFERRING PROVIDER: Nuala Alpha, MD  END OF SESSION:   PT End of Session - 03/07/22 1449     Visit Number 9    Date for PT Re-Evaluation 03/25/22    Authorization Type [employer] Advantage    Progress Note Due on Visit 10    PT Start Time 1445    PT Stop Time 1525    PT Time Calculation (min) 40 min    Activity Tolerance Patient tolerated treatment well              Past Medical History:  Diagnosis Date   AML (acute myeloblastic leukemia) ([place]) 1987   Anxiety    Appendicitis with peritoneal abscess 03/2015   s/p ex lap at [facility]   Atrial flutter [facility]) 2016   s/p cardioversion 02/2015, previously in [place], recently taken off amiodarone   Bradycardia 2015   s/p PPM insertion   H/O mitral valve replacement with mechanical valve    coumadin   Past Surgical History:  Procedure Laterality Date   APPENDECTOMY  03/24/2015   [facility] Dr Radene Knee.  perforated appendicitis   heart ablation     MITRAL VALVE REPLACEMENT  1986   PACEMAKER INSERTION     TUBAL LIGATION     Patient Active Problem List   Diagnosis Date Noted   Appendicitis with perforation s/p lap appy 03/22/2015 10/29/2015   Intra-abdominal abscess post-lap appy July 2016 10/29/2015   Intra-abdominal abscess ([place]) 10/29/2015   Abscess of abdominal cavity ([place]) 10/29/2015   Artificial cardiac pacemaker 05/17/2015   Sinoatrial node dysfunction ([place]) 05/17/2015   Long term current use of anticoagulant 03/22/2015   Encounter for therapeutic drug level monitoring 03/07/2015   Female genuine stress incontinence 03/25/2013   Anxiety disorder 08/14/2011   Dyslipidemia 08/13/2011   Atrial fibrillation ([place]) 06/11/2011   H/O mitral valve replacement with mechanical valve 06/11/2011    REFERRING DIAG: M79.651 (ICD-10-CM) - Pain in right thigh M79.652 (ICD-10-CM) -  Pain in left thigh      THERAPY DIAG: Chronic pain of right knee   Chronic pain of left knee   Difficulty in walking, not elsewhere classified   Muscle weakness (generalized)  PERTINENT HISTORY: Patient reports about 10 years ago she started having difficulty getting out of her low car and then covid hit and she became even more sedentary.  She feels she has gradually gotten weaker and the MD feels she may just be deconditioned.  She admits she did start a new statin drug about 3 to 4 months ago.  She describes her pain as muscle aching in the quads which worsens with activity and sit to stand. She hopes to gain strength and be able to get up and down from lower surfaces with increased ease.   Ankle pain seeing the doctor next week.   PRECAUTIONS: none  SUBJECTIVE:  I feel it some when I walk on gravel but otherwise it's OK.  Limited walking distance  to 2 blocks b/c of boot.    PAIN:  Are you having pain? No  NPRS scale: 0/10 Pain location: right medial and lateral knee Aggravating factors: when I first stand up  OBJECTIVE: (objective measures completed at initial evaluation unless otherwise dated)  OBJECTIVE:    DIAGNOSTIC FINDINGS: none   PATIENT SURVEYS:  FOTO 44 (goal 92)   COGNITION:  Overall cognitive status: Within functional limits for tasks assessed                          SENSATION: WFL     POSTURE:  Adducted hips, poor alignment with mod valgus knees   PALPATION: Seated flexion and extension    LE ROM:    (Blank rows = not tested)   LE MMT:   MMT Right 01/28/2022 Left 01/28/2022  Knee flexion WNL    Knee extension WN     (Blank rows = not tested)   LOWER EXTREMITY SPECIAL TESTS:  Knee special tests: Apley's compression test: negative   FUNCTIONAL TESTS:  5 times sit to stand: 13.10 Timed up and go (TUG): 8.62   GAIT: Distance walked: 50 ft Assistive device utilized: None Level of assistance: Modified independence Comments:  Antalgic, slight trendelenburg    TODAY'S TREATMENT:  6/22: Sidelying clams 10x right/left  NuStep x 5 min old model seat 9 level 3 Mini squats in front of chair 10x 6 inch step taps 20x 4 inch lateral step ups 10x each way 4 inch step ups x10 (no pain) right side only b/c of boot bulkiness 2 inch step downs 10x each side  2nd step hip flexor/calf stretch and HS stretch right/left  Ball on wall hip abduction isometric 5 sec hold 10x right/left  Hip machine 45# hip abduction and extension 10 x each right/left  Leg press 60# 20x with leg position in slight external rotation for comfort Seated LAQ with 5 lb ankle weights 2 x 10 each Seated hip flexion with up and out like getting in/out of the car 20 with 5 lb  ankle weight  Neuromuscular re-education to activate glutes, quads and avoid compensatory strategies        6/15: NuStep x 5 min level 3 6 inch step taps 20 4 inch lateral step ups 10x each way 4 inch step ups x10 (no pain) 2 inch step downs 10x each side  2nd step hip flexor/calf stretch and HS stretch right/left  Lateral band walks x 5 laps at bar (yellow loop) WB on 1 leg with 3 ways toe taps 10x each side with vc's for proper alignment when standing on right tapping left (added yellow loop around knees) Ball on wall hip abduction isometric 5 sec hold 10x right/left  Hip extension ball on wall 5 sec hold 5x right/left UE support on chair Hip machine 40# hip abduction and extension 15 x each right/left  Leg press 60# 20x with ball squeeze Seated LAQ with 4 lb ankle weights 2 x 10 each Seated marching x 20 with 4 lb ankle Neuromuscular re-education to activate glutes, quads and avoid compensatory strategies:  had patient standing in front of mirror to demonstrate proper alignment and how to achieve this IT band stretch in supine x 3 hold 30 sec    PATIENT EDUCATION:  Access Code: [email or web address] URL: [email or web address] Date: 03/07/2022 Prepared by:  Ruben Im  Exercises - Seated Long Arc Quad  - 1 x daily - 7 x weekly - 3 sets - 10 reps - Seated Hip Abduction  - 1 x daily - 7 x weekly - 3 sets - 10 reps - Sit to Stand Without Arm Support  - 1 x daily - 7 x weekly - 1 sets - 10 reps - Side Stepping with Resistance at Thighs and Counter Support  - 1 x daily - 7 x weekly - 1 sets -  10 reps - Forward and Backward Step Over with Beam (BKA)  - 1 x daily - 7 x weekly - 1 sets - 10 reps - Supine ITB Stretch with Strap  - 1 x daily - 7 x weekly - 1 sets - 3 reps - 30 sec hold - Seated Hip Abduction with Resistance  - 1 x daily - 7 x weekly - 2 sets - 10 reps - Seated Isometric Hip Adduction with Ball  - 1 x daily - 7 x weekly - 2 sets - 10 reps - Supine Figure 4 Piriformis Stretch  - 1 x daily - 7 x weekly - 1 sets - 3 reps - 30 hold - Clamshell  - 1 x daily - 7 x weekly - 1 sets - 10 reps - Forward Step Up  - 1 x daily - 7 x weekly - 1 sets - 10 reps - Forward Step Down  - 1 x daily - 7 x weekly - 1 sets - 10 reps - Step Taps on High Step  - 1 x daily - 7 x weekly - 1 sets - 10 reps - Single Leg 3 Way Reach with Support  - 1 x daily - 7 x weekly - 1 sets - 10 reps - Standing Isometric Hip Abduction with Ball on Wall  - 1 x daily - 7 x weekly - 1 sets - 10 reps - Standing Hamstring Stretch with Step  - 1 x daily - 7 x weekly - 1 sets - 10 reps - Seated March with Ankle Weights at Foot  - 1 x daily - 7 x weekly - 3 sets - 10 reps - Quarter Squat with Table  - 1 x daily - 7 x weekly - 1 sets - 10 reps - Standing Hamstring Stretch with Step  - 1 x daily - 7 x weekly - 1 sets - 10 reps - Seated March with Ankle Weights at Foot  - 1 x daily - 7 x weekly - 3 sets - 10 reps - ASSESSMENT:   CLINICAL IMPRESSION:   The patient is progressing well with strengthening of quad and glute muscles needed for improved patellofemoral alignment and functional strength.  Some modifications needed secondary to bulky walking boot limiting agility with some ex's.   Some initial pain with LAQ weight progression, leg press and Nu-Step but with altering knee position or with repetition pain resolves.      OBJECTIVE IMPAIRMENTS Abnormal gait, decreased mobility, difficulty walking, decreased ROM, decreased strength, increased muscle spasms, impaired flexibility, and pain.    ACTIVITY LIMITATIONS cleaning, community activity, driving, meal prep, occupation, laundry, yard work, and shopping.    PERSONAL FACTORS Age, Fitness, and 1-2 comorbidities: anxiety, bradycardia  are also affecting patient's functional outcome.      REHAB POTENTIAL: Good   CLINICAL DECISION MAKING: Stable/uncomplicated   EVALUATION COMPLEXITY: Low     GOALS: Goals reviewed with patient? Yes   SHORT TERM GOALS: Target date: 02/25/2022  (Remove Blue Hyperlink)   Patient will be independent with initial HEP  Baseline: Goal status: MET   2.  Pain report to be no greater than 4/10  Baseline:  Goal status: MET   3.  Patient to be able to demonstrate proper heel to toe progression Baseline:  Goal status: MET     LONG TERM GOALS: Target date: 03/25/2022  (Remove Blue Hyperlink)   Patient to be independent with advanced HEP  Baseline:  Goal status: INITIAL   2.  Patient to report pain no greater than 2/10  Baseline:  Goal status: INITIAL   3.  Patient to be able to ascend and descend steps safely without knee instability episodes Baseline:  Goal status: INITIAL   4.  Patient to be able to bend, stoop and squat with pain no greater than 2/10 in bilateral knees and thighs.  Baseline:  Goal status: INITIAL   5.  Patient to be able to walk 1/2 mile without need to rest.  Baseline:  Goal status: INITIAL       PLAN: PT FREQUENCY: 2x/week   PT DURATION: 8 weeks   PLANNED INTERVENTIONS: Therapeutic exercises, Therapeutic activity, Neuromuscular re-education, Balance training, Gait training, Patient/Family education, Joint mobilization, Aquatic Therapy, Dry  Needling, Electrical stimulation, Cryotherapy, Moist heat, Taping, Vasopneumatic device, Ultrasound, Ionotophoresis 35m/ml Dexamethasone, and Manual therapy   PLAN FOR NEXT SESSION: 10th visit progress note due:  FOTO;  Nustep, 2 inch step downs, ball on wall hip abduction;  leg press  increase weight to 70# with or without ball squeeze ;hip machine;  discussed ionto but pt unsure about whether OK with coumadin and heart considerations (told no steroids)  progress quad rehab and hip strength  SRuben Im PT 03/07/22 5:31 PM Phone: [phone]: [phone]: [phone]: [phone] [place] [place] [address]Phone # [phone] [phone]

## 2022-03-08 DIAGNOSIS — Z7901 Long term (current) use of anticoagulants: Secondary | ICD-10-CM | POA: Diagnosis not present

## 2022-03-12 ENCOUNTER — Ambulatory Visit: Payer: Medicare (Managed Care)

## 2022-03-12 DIAGNOSIS — G4733 Obstructive sleep apnea (adult) (pediatric): Secondary | ICD-10-CM | POA: Diagnosis not present

## 2022-03-12 DIAGNOSIS — G8929 Other chronic pain: Secondary | ICD-10-CM

## 2022-03-12 DIAGNOSIS — R252 Cramp and spasm: Secondary | ICD-10-CM

## 2022-03-12 DIAGNOSIS — R262 Difficulty in walking, not elsewhere classified: Secondary | ICD-10-CM

## 2022-03-12 DIAGNOSIS — E669 Obesity, unspecified: Secondary | ICD-10-CM | POA: Diagnosis not present

## 2022-03-12 DIAGNOSIS — I4891 Unspecified atrial fibrillation: Secondary | ICD-10-CM | POA: Diagnosis not present

## 2022-03-12 DIAGNOSIS — G4700 Insomnia, unspecified: Secondary | ICD-10-CM | POA: Diagnosis not present

## 2022-03-12 DIAGNOSIS — M6281 Muscle weakness (generalized): Secondary | ICD-10-CM

## 2022-03-12 DIAGNOSIS — M25561 Pain in right knee: Secondary | ICD-10-CM | POA: Diagnosis not present

## 2022-03-14 ENCOUNTER — Ambulatory Visit: Payer: Medicare (Managed Care)

## 2022-03-15 ENCOUNTER — Ambulatory Visit: Payer: Medicare (Managed Care)

## 2022-03-15 DIAGNOSIS — R262 Difficulty in walking, not elsewhere classified: Secondary | ICD-10-CM

## 2022-03-15 DIAGNOSIS — M6281 Muscle weakness (generalized): Secondary | ICD-10-CM

## 2022-03-15 DIAGNOSIS — R252 Cramp and spasm: Secondary | ICD-10-CM

## 2022-03-15 DIAGNOSIS — G8929 Other chronic pain: Secondary | ICD-10-CM

## 2022-03-15 DIAGNOSIS — M25561 Pain in right knee: Secondary | ICD-10-CM | POA: Diagnosis not present

## 2022-03-15 NOTE — Therapy (Signed)
OUTPATIENT PHYSICAL THERAPY TREATMENT NOTE   Patient Name: Mercedes Thompson MRN: [id number] DOB:1945-11-12, 76 y.o., female Today's Date: 03/15/2022   REFERRING PROVIDER: Arlyce Harman, MD  END OF SESSION:   PT End of Session - 03/15/22 0806     Visit Number 11    Date for PT Re-Evaluation 03/25/22    Authorization Type [employer]age    Progress Note Due on Visit 20    PT Start Time 0802    PT Stop Time 0845    PT Time Calculation (min) 43 min    Activity Tolerance Patient tolerated treatment well    Behavior During Therapy [facility] for tasks assessed/performed              Past Medical History:  Diagnosis Date   AML (acute myeloblastic leukemia) (HCC) 1987   Anxiety    Appendicitis with peritoneal abscess 03/2015   s/p ex lap at [facility]   Atrial flutter [facility]) 2016   s/p cardioversion 02/2015, previously in [place], recently taken off amiodarone   Bradycardia 2015   s/p PPM insertion   H/O mitral valve replacement with mechanical valve    coumadin   Past Surgical History:  Procedure Laterality Date   APPENDECTOMY  03/24/2015   [facility] Dr Cherly Hensen.  perforated appendicitis   heart ablation     MITRAL VALVE REPLACEMENT  1986   PACEMAKER INSERTION     TUBAL LIGATION     Patient Active Problem List   Diagnosis Date Noted   Appendicitis with perforation s/p lap appy 03/22/2015 10/29/2015   Intra-abdominal abscess post-lap appy July 2016 10/29/2015   Intra-abdominal abscess (HCC) 10/29/2015   Abscess of abdominal cavity (HCC) 10/29/2015   Artificial cardiac pacemaker 05/17/2015   Sinoatrial node dysfunction (HCC) 05/17/2015   Long term current use of anticoagulant 03/22/2015   Encounter for therapeutic drug level monitoring 03/07/2015   Female genuine stress incontinence 03/25/2013   Anxiety disorder 08/14/2011   Dyslipidemia 08/13/2011   Atrial fibrillation (HCC) 06/11/2011   H/O mitral valve replacement with mechanical valve 06/11/2011    REFERRING DIAG:  M79.651 (ICD-10-CM) - Pain in right thigh M79.652 (ICD-10-CM) - Pain in left thigh      THERAPY DIAG: Chronic pain of right knee   Chronic pain of left knee   Difficulty in walking, not elsewhere classified   Muscle weakness (generalized)  PERTINENT HISTORY: Patient reports about 10 years ago she started having difficulty getting out of her low car and then covid hit and she became even more sedentary.  She feels she has gradually gotten weaker and the MD feels she may just be deconditioned.  She admits she did start a new statin drug about 3 to 4 months ago.  She describes her pain as muscle aching in the quads which worsens with activity and sit to stand. She hopes to gain strength and be able to get up and down from lower surfaces with increased ease.   Ankle pain seeing the doctor next week.   PRECAUTIONS: none  SUBJECTIVE:  "I went to the pool with my granddaughter and   did my exercises in the pool the last 2 days and that was so much easier"    PAIN:  Are you having pain? Yes NPRS scale: 4/10 Pain location: right medial and lateral knee Aggravating factors: when I first stand up     OBJECTIVE: (objective measures completed at initial evaluation unless otherwise dated)  OBJECTIVE:    DIAGNOSTIC FINDINGS: none  PATIENT SURVEYS:  FOTO 36 (goal 75) Do next session (needed 03/12/22 for 10th visit)   COGNITION:           Overall cognitive status: Within functional limits for tasks assessed                          SENSATION: WFL     POSTURE:  Adducted hips, poor alignment with mod valgus knees   PALPATION: Seated flexion and extension    LE ROM:    (Blank rows = not tested)   LE MMT:   MMT Right 01/28/2022 Left 01/28/2022  Knee flexion WNL    Knee extension WN     (Blank rows = not tested)   LOWER EXTREMITY SPECIAL TESTS:  Knee special tests: Apley's compression test: negative   FUNCTIONAL TESTS:  5 times sit to stand: 13.10 Timed up and go (TUG):  8.62   GAIT: Distance walked: 50 ft Assistive device utilized: None Level of assistance: Modified independence Comments: Antalgic, slight trendelenburg    TODAY'S TREATMENT:  03/15/22: Recumbent bike level 3 x 5 min (Nustep unavailable) Seated LAQ x 20 with 4# (did not have to use ball for adduction) Pain was minimal Seated march x 20 with 4 lb ankle weights Seated hip ER with 4 lb ankle weights (bottom of foot to opposite ankle with hip abducted) Sit to stand 2 x 10 edge of table with 10 lb kb Seated clam x 20 with blue loop Rocker board x 2 min SLS attempting 10 sec x 5 each LE 6 inch lateral step ups 10x each way 6 inch step ups x10 minimal pain Cone touches 2 x 10 (cone on table) 03/12/22: NuStep x 5 min old model seat 9 level 3 Leg press 60# 20x with leg position in slight external rotation for comfort Seated LAQ with 5 lb ankle weights 2 x 10 each with purple ball for adduction Seated march x 20 with 5 lb ankle weights Seated hip ER with 5 lb ankle weights (bottom of foot to opposite ankle with hip abducted) Sit to stand 2 x 10 (chair with balance pad) Seated clam x 20 with blue loop 6 inch step taps 20x 4 inch lateral step ups 10x each way 4 inch step ups x10 (no pain) right side only b/c of boot bulkiness 2 inch then 4" step downs 10x each side  Standing hip abduction and and extension 2 x 10 each LE with blue loop   6/22: Sidelying clams 10x right/left  NuStep x 5 min old model seat 9 level 3 Mini squats in front of chair 10x 6 inch step taps 20x 4 inch lateral step ups 10x each way 4 inch step ups x10 (no pain) right side only b/c of boot bulkiness 2 inch step downs 10x each side  2nd step hip flexor/calf stretch and HS stretch right/left  Ball on wall hip abduction isometric 5 sec hold 10x right/left  Hip machine 45# hip abduction and extension 10 x each right/left  Leg press 60# 20x with leg position in slight external rotation for comfort Seated LAQ with 5  lb ankle weights 2 x 10 each Seated hip flexion with up and out like getting in/out of the car 20 with 5 lb  ankle weight  Neuromuscular re-education to activate glutes, quads and avoid compensatory strategies   PATIENT EDUCATION:  Access Code: [email or web address] URL: [email or web address] Date: 03/07/2022 Prepared by: Shaniko  Arc Quad  - 1 x daily - 7 x weekly - 3 sets - 10 reps - Seated Hip Abduction  - 1 x daily - 7 x weekly - 3 sets - 10 reps - Sit to Stand Without Arm Support  - 1 x daily - 7 x weekly - 1 sets - 10 reps - Side Stepping with Resistance at Thighs and Counter Support  - 1 x daily - 7 x weekly - 1 sets - 10 reps - Forward and Backward Step Over with Beam (BKA)  - 1 x daily - 7 x weekly - 1 sets - 10 reps - Supine ITB Stretch with Strap  - 1 x daily - 7 x weekly - 1 sets - 3 reps - 30 sec hold - Seated Hip Abduction with Resistance  - 1 x daily - 7 x weekly - 2 sets - 10 reps - Seated Isometric Hip Adduction with Ball  - 1 x daily - 7 x weekly - 2 sets - 10 reps - Supine Figure 4 Piriformis Stretch  - 1 x daily - 7 x weekly - 1 sets - 3 reps - 30 hold - Clamshell  - 1 x daily - 7 x weekly - 1 sets - 10 reps - Forward Step Up  - 1 x daily - 7 x weekly - 1 sets - 10 reps - Forward Step Down  - 1 x daily - 7 x weekly - 1 sets - 10 reps - Step Taps on High Step  - 1 x daily - 7 x weekly - 1 sets - 10 reps - Single Leg 3 Way Reach with Support  - 1 x daily - 7 x weekly - 1 sets - 10 reps - Standing Isometric Hip Abduction with Ball on Wall  - 1 x daily - 7 x weekly - 1 sets - 10 reps - Standing Hamstring Stretch with Step  - 1 x daily - 7 x weekly - 1 sets - 10 reps - Seated March with Ankle Weights at Foot  - 1 x daily - 7 x weekly - 3 sets - 10 reps - Quarter Squat with Table  - 1 x daily - 7 x weekly - 1 sets - 10 reps - Standing Hamstring Stretch with Step  - 1 x daily - 7 x weekly - 1 sets - 10 reps - Seated March with Ankle Weights  at Foot  - 1 x daily - 7 x weekly - 3 sets - 10 reps - ASSESSMENT:   CLINICAL IMPRESSION:   Dhalia did extremely well today.  She is having less PF pain and able to do tasks such as step ups on a higher step with improved alignment.   She would benefit from continuing skilled PT for quad rehab and alignment training as well as generalized LE strengthening.     OBJECTIVE IMPAIRMENTS Abnormal gait, decreased mobility, difficulty walking, decreased ROM, decreased strength, increased muscle spasms, impaired flexibility, and pain.    ACTIVITY LIMITATIONS cleaning, community activity, driving, meal prep, occupation, laundry, yard work, and shopping.    PERSONAL FACTORS Age, Fitness, and 1-2 comorbidities: anxiety, bradycardia  are also affecting patient's functional outcome.      REHAB POTENTIAL: Good   CLINICAL DECISION MAKING: Stable/uncomplicated   EVALUATION COMPLEXITY: Low     GOALS: Goals reviewed with patient? Yes   SHORT TERM GOALS: Target date: 02/25/2022  (Remove Blue Hyperlink)   Patient will be independent with initial HEP  Baseline: Goal  status: MET   2.  Pain report to be no greater than 4/10  Baseline:  Goal status: MET   3.  Patient to be able to demonstrate proper heel to toe progression Baseline:  Goal status: MET     LONG TERM GOALS: Target date: 03/25/2022  (Remove Blue Hyperlink)   Patient to be independent with advanced HEP  Baseline:  Goal status: in Progress   2.  Patient to report pain no greater than 2/10  Baseline:  Goal status: In progress   3.  Patient to be able to ascend and descend steps safely without knee instability episodes Baseline:  Goal status: In progress   4.  Patient to be able to bend, stoop and squat with pain no greater than 2/10 in bilateral knees and thighs.  Baseline:  Goal status: INITIAL   5.  Patient to be able to walk 1/2 mile without need to rest.  Baseline:  Goal status: INITIAL       PLAN: PT FREQUENCY:  2x/week   PT DURATION: 8 weeks   PLANNED INTERVENTIONS: Therapeutic exercises, Therapeutic activity, Neuromuscular re-education, Balance training, Gait training, Patient/Family education, Joint mobilization, Aquatic Therapy, Dry Needling, Electrical stimulation, Cryotherapy, Moist heat, Taping, Vasopneumatic device, Ultrasound, Ionotophoresis 4mg /ml Dexamethasone, and Manual therapy   PLAN FOR NEXT SESSION: 10th visit progress note due (please do next visit) (patient had bathroom emergency and we were not able to complete):  FOTO;  Nustep, 2 inch step downs, ball on wall hip abduction;  leg press  increase weight to 70# with or without ball squeeze ;hip machine;  discussed ionto but pt unsure about whether OK with coumadin and heart considerations (told no steroids)  progress quad rehab and hip strength  Naresh Althaus B. Madysin Crisp, PT 03/15/22 8:48 AM  Phone: [phone] Fax: [phone] Phone: [phone] Fax: [place] [address] [place] [address] [place], [place] [address] Phone # [phone] Fax [phone]

## 2022-03-21 ENCOUNTER — Ambulatory Visit: Payer: Medicare (Managed Care) | Attending: Family Medicine | Admitting: Physical Therapy

## 2022-03-21 DIAGNOSIS — R262 Difficulty in walking, not elsewhere classified: Secondary | ICD-10-CM | POA: Diagnosis present

## 2022-03-21 DIAGNOSIS — M6281 Muscle weakness (generalized): Secondary | ICD-10-CM | POA: Insufficient documentation

## 2022-03-21 DIAGNOSIS — G8929 Other chronic pain: Secondary | ICD-10-CM | POA: Diagnosis present

## 2022-03-21 DIAGNOSIS — M25562 Pain in left knee: Secondary | ICD-10-CM | POA: Diagnosis present

## 2022-03-21 DIAGNOSIS — R252 Cramp and spasm: Secondary | ICD-10-CM | POA: Diagnosis present

## 2022-03-21 DIAGNOSIS — M25561 Pain in right knee: Secondary | ICD-10-CM | POA: Insufficient documentation

## 2022-03-21 NOTE — Therapy (Signed)
OUTPATIENT PHYSICAL THERAPY TREATMENT NOTE/RECERTIFICATION   Patient Name: Mercedes Thompson MRN: [id number] DOB:10-05-1945, 76 y.o., female Today's Date: 03/21/2022   REFERRING PROVIDER: Nuala Alpha, MD  END OF SESSION:   PT End of Session - 03/21/22 1350     Visit Number 12    Date for PT Re-Evaluation 05/16/22    Authorization Type [employer] Advantage    Progress Note Due on Visit 20    PT Start Time 1355    PT Stop Time 1435    PT Time Calculation (min) 40 min    Activity Tolerance Patient tolerated treatment well              Past Medical History:  Diagnosis Date   AML (acute myeloblastic leukemia) ([place]) 1987   Anxiety    Appendicitis with peritoneal abscess 03/2015   s/p ex lap at [facility]   Atrial flutter [facility]) 2016   s/p cardioversion 02/2015, previously in [employer], recently taken off amiodarone   Bradycardia 2015   s/p PPM insertion   H/O mitral valve replacement with mechanical valve    coumadin   Past Surgical History:  Procedure Laterality Date   APPENDECTOMY  03/24/2015   [facility] Dr Radene Knee.  perforated appendicitis   heart ablation     MITRAL VALVE REPLACEMENT  1986   PACEMAKER INSERTION     TUBAL LIGATION     Patient Active Problem List   Diagnosis Date Noted   Appendicitis with perforation s/p lap appy 03/22/2015 10/29/2015   Intra-abdominal abscess post-lap appy July 2016 10/29/2015   Intra-abdominal abscess ([place]) 10/29/2015   Abscess of abdominal cavity ([place]) 10/29/2015   Artificial cardiac pacemaker 05/17/2015   Sinoatrial node dysfunction ([place]) 05/17/2015   Long term current use of anticoagulant 03/22/2015   Encounter for therapeutic drug level monitoring 03/07/2015   Female genuine stress incontinence 03/25/2013   Anxiety disorder 08/14/2011   Dyslipidemia 08/13/2011   Atrial fibrillation ([place]) 06/11/2011   H/O mitral valve replacement with mechanical valve 06/11/2011    REFERRING DIAG: M79.651 (ICD-10-CM) - Pain in right thigh  M79.652 (ICD-10-CM) - Pain in left thigh      THERAPY DIAG: Chronic pain of right knee   Chronic pain of left knee   Difficulty in walking, not elsewhere classified   Muscle weakness (generalized)  PERTINENT HISTORY: Patient reports about 10 years ago she started having difficulty getting out of her low car and then covid hit and she became even more sedentary.  She feels she has gradually gotten weaker and the MD feels she may just be deconditioned.  She admits she did start a new statin drug about 3 to 4 months ago.  She describes her pain as muscle aching in the quads which worsens with activity and sit to stand. She hopes to gain strength and be able to get up and down from lower surfaces with increased ease.   Ankle pain seeing the doctor next week.   PRECAUTIONS: none  SUBJECTIVE:  I'm having pain on the side of the leg.  I'm always sore the day after PT.    PAIN:  Are you having pain? Yes NPRS scale: 4/10 Pain location: right medial and lateral knee Aggravating factors: when I first stand up     OBJECTIVE: (objective measures completed at initial evaluation unless otherwise dated)  OBJECTIVE:    DIAGNOSTIC FINDINGS: none   PATIENT SURVEYS:  FOTO 44 (goal 57) 7/6:  63%   COGNITION:  Overall cognitive status: Within functional limits for tasks assessed                          SENSATION: WFL     POSTURE:  Adducted hips, poor alignment with mod valgus knees   PALPATION: Seated flexion and extension    LE ROM:  7/6:  Right and left knee flexion 129 degrees   (Blank rows = not tested)   LE MMT:  7/6:  hip abduction 4+/5    MMT Right 01/28/2022 Left 01/28/2022 7/6  Knee flexion WNL   5  Knee extension WN   4+   (Blank rows = not tested)   LOWER EXTREMITY SPECIAL TESTS:  Knee special tests: Apley's compression test: negative   FUNCTIONAL TESTS:  5 times sit to stand: 13.10 Timed up and go (TUG): 8.62   7/6: TUG: 7.67 sec 5x sit to  stand 12.74   GAIT: Distance walked: 50 ft Assistive device utilized: None Level of assistance: Modified independence Comments: Antalgic, slight trendelenburg    TODAY'S TREATMENT:  03/21/22: FOTO, TUG, 5x sit to stand, MMT NuStep x 6 min new model seat 6 level 5 Leg press 60# 20x with leg position in slight external rotation for comfort SLS with cone tap on chair (needs some UE support) Hip machine 40# abduction and extension 10x right/left Kickstand position with 5# single arm dead lifts 10x right/left  Therapeutic activity: walking, standing, sit to stand, balancing     03/15/22: Recumbent bike level 3 x 5 min (Nustep unavailable) Seated LAQ x 20 with 4# (did not have to use ball for adduction) Pain was minimal Seated march x 20 with 4 lb ankle weights Seated hip ER with 4 lb ankle weights (bottom of foot to opposite ankle with hip abducted) Sit to stand 2 x 10 edge of table with 10 lb kb Seated clam x 20 with blue loop Rocker board x 2 min SLS attempting 10 sec x 5 each LE 6 inch lateral step ups 10x each way 6 inch step ups x10 minimal pain Cone touches 2 x 10 (cone on table) 03/12/22: NuStep x 5 min old model seat 9 level 3 Leg press 60# 20x with leg position in slight external rotation for comfort Seated LAQ with 5 lb ankle weights 2 x 10 each with purple ball for adduction Seated march x 20 with 5 lb ankle weights Seated hip ER with 5 lb ankle weights (bottom of foot to opposite ankle with hip abducted) Sit to stand 2 x 10 (chair with balance pad) Seated clam x 20 with blue loop 6 inch step taps 20x 4 inch lateral step ups 10x each way 4 inch step ups x10 (no pain) right side only b/c of boot bulkiness 2 inch then 4" step downs 10x each side  Standing hip abduction and and extension 2 x 10 each LE with blue loop   PATIENT EDUCATION:  Access Code: [email or web address] URL: [email or web address] Date: 03/07/2022 Prepared by: Ruben Im  Exercises -  Seated Long Arc Quad  - 1 x daily - 7 x weekly - 3 sets - 10 reps - Seated Hip Abduction  - 1 x daily - 7 x weekly - 3 sets - 10 reps - Sit to Stand Without Arm Support  - 1 x daily - 7 x weekly - 1 sets - 10 reps - Side Stepping with Resistance at Thighs and Counter Support  - 1 x daily - 7  x weekly - 1 sets - 10 reps - Forward and Backward Step Over with Beam (BKA)  - 1 x daily - 7 x weekly - 1 sets - 10 reps - Supine ITB Stretch with Strap  - 1 x daily - 7 x weekly - 1 sets - 3 reps - 30 sec hold - Seated Hip Abduction with Resistance  - 1 x daily - 7 x weekly - 2 sets - 10 reps - Seated Isometric Hip Adduction with Ball  - 1 x daily - 7 x weekly - 2 sets - 10 reps - Supine Figure 4 Piriformis Stretch  - 1 x daily - 7 x weekly - 1 sets - 3 reps - 30 hold - Clamshell  - 1 x daily - 7 x weekly - 1 sets - 10 reps - Forward Step Up  - 1 x daily - 7 x weekly - 1 sets - 10 reps - Forward Step Down  - 1 x daily - 7 x weekly - 1 sets - 10 reps - Step Taps on High Step  - 1 x daily - 7 x weekly - 1 sets - 10 reps - Single Leg 3 Way Reach with Support  - 1 x daily - 7 x weekly - 1 sets - 10 reps - Standing Isometric Hip Abduction with Ball on Wall  - 1 x daily - 7 x weekly - 1 sets - 10 reps - Standing Hamstring Stretch with Step  - 1 x daily - 7 x weekly - 1 sets - 10 reps - Seated March with Ankle Weights at Foot  - 1 x daily - 7 x weekly - 3 sets - 10 reps - Quarter Squat with Table  - 1 x daily - 7 x weekly - 1 sets - 10 reps - Standing Hamstring Stretch with Step  - 1 x daily - 7 x weekly - 1 sets - 10 reps - Seated March with Ankle Weights at Foot  - 1 x daily - 7 x weekly - 3 sets - 10 reps - ASSESSMENT:   CLINICAL IMPRESSION:   The patient demonstrates significant improvement in FOTO score as well as 5x sit to stand and TUG times.  Her bil knee ROM is WFLS but feels tighter with right knee endrange flexion.  Deficit with eccentric quad strength on right as well as glute medius weakness  affecting ability to descend stairs confidently and single leg stand.  She hopes for further improvement in knee pain with daily function.  Recommend PT for further progression of strengthening with a gradual tapering over the next 8 weeks to promote independence with HEP.     OBJECTIVE IMPAIRMENTS Abnormal gait, decreased mobility, difficulty walking, decreased ROM, decreased strength, increased muscle spasms, impaired flexibility, and pain.    ACTIVITY LIMITATIONS cleaning, community activity, driving, meal prep, occupation, laundry, yard work, and shopping.    PERSONAL FACTORS Age, Fitness, and 1-2 comorbidities: anxiety, bradycardia  are also affecting patient's functional outcome.      REHAB POTENTIAL: Good   CLINICAL DECISION MAKING: Stable/uncomplicated   EVALUATION COMPLEXITY: Low     GOALS: Goals reviewed with patient? Yes   SHORT TERM GOALS: Target date: 02/25/2022  (Remove Blue Hyperlink)   Patient will be independent with initial HEP  Baseline: Goal status: MET   2.  Pain report to be no greater than 4/10  Baseline:  Goal status: MET   3.  Patient to be able to demonstrate proper heel to  toe progression Baseline:  Goal status: MET     LONG TERM GOALS: Target date: 05/16/2022   Patient to be independent with advanced HEP  Baseline:  Goal status: in Progress   2.  Patient to report pain no greater than 2/10  Baseline:  Goal status: In progress   3.  Patient to be able to ascend and descend steps safely without knee instability episodes Baseline:  Goal status: In progress   4.  Patient to be able to bend, stoop and squat with pain no greater than 2/10 in bilateral knees and thighs.  Baseline:  Goal status: INITIAL   5.  Patient to be able to walk 1/2 mile without need to rest.  Baseline:  Goal status: INITIAL       PLAN: PT FREQUENCY: 2x/week   PT DURATION: 8 weeks   PLANNED INTERVENTIONS: Therapeutic exercises, Therapeutic activity,  Neuromuscular re-education, Balance training, Gait training, Patient/Family education, Joint mobilization, Aquatic Therapy, Dry Needling, Electrical stimulation, Cryotherapy, Moist heat, Taping, Vasopneumatic device, Ultrasound, Ionotophoresis 60m/ml Dexamethasone, and Manual therapy   PLAN FOR NEXT SESSION:  quad strengthening; glute med strengthening; step ups/downs; leg press; hip machine; progressive loading; patient may try a Cho-Pat strap for comfort  SRuben Im PT 03/21/22 5:30 PM Phone: [phone]: [phone]

## 2022-03-26 ENCOUNTER — Ambulatory Visit: Payer: Medicare (Managed Care)

## 2022-03-26 DIAGNOSIS — M25561 Pain in right knee: Secondary | ICD-10-CM | POA: Diagnosis not present

## 2022-03-26 DIAGNOSIS — R262 Difficulty in walking, not elsewhere classified: Secondary | ICD-10-CM

## 2022-03-26 DIAGNOSIS — M6281 Muscle weakness (generalized): Secondary | ICD-10-CM

## 2022-03-26 DIAGNOSIS — G8929 Other chronic pain: Secondary | ICD-10-CM

## 2022-03-26 DIAGNOSIS — R252 Cramp and spasm: Secondary | ICD-10-CM

## 2022-03-26 NOTE — Therapy (Signed)
OUTPATIENT PHYSICAL THERAPY TREATMENT NOTE/RECERTIFICATION   Patient Name: Bera Pinela MRN: [id number] DOB:02-28-1946, 76 y.o., female Today's Date: 03/26/2022   REFERRING PROVIDER: Arlyce Harman, MD  END OF SESSION:   PT End of Session - 03/26/22 1725     Visit Number 13    Date for PT Re-Evaluation 05/16/22    Authorization Type [employer]age    Progress Note Due on Visit 20    PT Start Time 1445    PT Stop Time 1530    PT Time Calculation (min) 45 min    Activity Tolerance Patient tolerated treatment well    Behavior During Therapy [facility] for tasks assessed/performed               Past Medical History:  Diagnosis Date   AML (acute myeloblastic leukemia) (HCC) 1987   Anxiety    Appendicitis with peritoneal abscess 03/2015   s/p ex lap at [facility]   Atrial flutter [facility]) 2016   s/p cardioversion 02/2015, previously in [place], recently taken off amiodarone   Bradycardia 2015   s/p PPM insertion   H/O mitral valve replacement with mechanical valve    coumadin   Past Surgical History:  Procedure Laterality Date   APPENDECTOMY  03/24/2015   [facility] Dr Cherly Hensen.  perforated appendicitis   heart ablation     MITRAL VALVE REPLACEMENT  1986   PACEMAKER INSERTION     TUBAL LIGATION     Patient Active Problem List   Diagnosis Date Noted   Appendicitis with perforation s/p lap appy 03/22/2015 10/29/2015   Intra-abdominal abscess post-lap appy July 2016 10/29/2015   Intra-abdominal abscess (HCC) 10/29/2015   Abscess of abdominal cavity (HCC) 10/29/2015   Artificial cardiac pacemaker 05/17/2015   Sinoatrial node dysfunction (HCC) 05/17/2015   Long term current use of anticoagulant 03/22/2015   Encounter for therapeutic drug level monitoring 03/07/2015   Female genuine stress incontinence 03/25/2013   Anxiety disorder 08/14/2011   Dyslipidemia 08/13/2011   Atrial fibrillation (HCC) 06/11/2011   H/O mitral valve replacement with mechanical valve 06/11/2011     REFERRING DIAG: M79.651 (ICD-10-CM) - Pain in right thigh M79.652 (ICD-10-CM) - Pain in left thigh      THERAPY DIAG: Chronic pain of right knee   Chronic pain of left knee   Difficulty in walking, not elsewhere classified   Muscle weakness (generalized)  PERTINENT HISTORY: Patient reports about 10 years ago she started having difficulty getting out of her low car and then covid hit and she became even more sedentary.  She feels she has gradually gotten weaker and the MD feels she may just be deconditioned.  She admits she did start a new statin drug about 3 to 4 months ago.  She describes her pain as muscle aching in the quads which worsens with activity and sit to stand. She hopes to gain strength and be able to get up and down from lower surfaces with increased ease.   Ankle pain seeing the doctor next week.   PRECAUTIONS: none  SUBJECTIVE:  I'm still sore on the right lateral knee but I can go up and down steps now with no problems and I feel stronger overall.      PAIN:  Are you having pain? Yes NPRS scale: 4/10 Pain location: right medial and lateral knee Aggravating factors: when I first stand up     OBJECTIVE: (objective measures completed at initial evaluation unless otherwise dated)  OBJECTIVE:    DIAGNOSTIC FINDINGS: none  PATIENT SURVEYS:  FOTO 44 (goal 17) 7/6:  63%   COGNITION:           Overall cognitive status: Within functional limits for tasks assessed                          SENSATION: WFL     POSTURE:  Adducted hips, poor alignment with mod valgus knees   PALPATION: Seated flexion and extension    LE ROM:  7/6:  Right and left knee flexion 129 degrees   (Blank rows = not tested)   LE MMT:  7/6:  hip abduction 4+/5    MMT Right 01/28/2022 Left 01/28/2022 7/6  Knee flexion WNL   5  Knee extension WN   4+   (Blank rows = not tested)   LOWER EXTREMITY SPECIAL TESTS:  Knee special tests: Apley's compression test: negative    FUNCTIONAL TESTS:  5 times sit to stand: 13.10 Timed up and go (TUG): 8.62   7/6: TUG: 7.67 sec 5x sit to stand 12.74   GAIT: Distance walked: 50 ft Assistive device utilized: None Level of assistance: Modified independence Comments: Antalgic, slight trendelenburg    TODAY'S TREATMENT:  03/26/22: NuStep level 3 x 5 min  Seated LAQ x 20 with 5 lb (did not have to use ball for adduction) No pain Seated march x 20 with 5 lb ankle weights Seated hip ER with 5 lb ankle weights (bottom of foot to opposite ankle with hip abducted) Seated hip flexion combined with abd (as with getting in/out car) with 5 lb ankle weights Sit to stand 2 x 10 from chair with 10 lb kb Lateral band walks with blue loop x 5 laps along barre Seated clam x 20 with blue loop Seated hip adduction with purple ball x 20 Rocker board x 2 min 6 inch lateral step ups 10x each way 6 inch fwd step ups x10 minimal pain Cone touches 2 x 10 (cone on table)  03/21/22: FOTO, TUG, 5x sit to stand, MMT NuStep x 6 min new model seat 6 level 5 Leg press 60# 20x with leg position in slight external rotation for comfort SLS with cone tap on chair (needs some UE support) Hip machine 40# abduction and extension 10x right/left Kickstand position with 5# single arm dead lifts 10x right/left  Therapeutic activity: walking, standing, sit to stand, balancing     03/15/22: Recumbent bike level 3 x 5 min (Nustep unavailable) Seated LAQ x 20 with 4# (did not have to use ball for adduction) Pain was minimal Seated march x 20 with 4 lb ankle weights Seated hip ER with 4 lb ankle weights (bottom of foot to opposite ankle with hip abducted) Sit to stand 2 x 10 edge of table with 10 lb kb Seated clam x 20 with blue loop Rocker board x 2 min SLS attempting 10 sec x 5 each LE 6 inch lateral step ups 10x each way 6 inch step ups x10 minimal pain Cone touches 2 x 10 (cone on table)  PATIENT EDUCATION:  Access Code: [email or web address] URL:  [email or web address] Date: 03/07/2022 Prepared by: Ruben Im  Exercises - Seated Long Arc Quad  - 1 x daily - 7 x weekly - 3 sets - 10 reps - Seated Hip Abduction  - 1 x daily - 7 x weekly - 3 sets - 10 reps - Sit to Stand Without Arm Support  - 1 x daily - 7 x weekly -  1 sets - 10 reps - Side Stepping with Resistance at Thighs and Counter Support  - 1 x daily - 7 x weekly - 1 sets - 10 reps - Forward and Backward Step Over with Beam (BKA)  - 1 x daily - 7 x weekly - 1 sets - 10 reps - Supine ITB Stretch with Strap  - 1 x daily - 7 x weekly - 1 sets - 3 reps - 30 sec hold - Seated Hip Abduction with Resistance  - 1 x daily - 7 x weekly - 2 sets - 10 reps - Seated Isometric Hip Adduction with Ball  - 1 x daily - 7 x weekly - 2 sets - 10 reps - Supine Figure 4 Piriformis Stretch  - 1 x daily - 7 x weekly - 1 sets - 3 reps - 30 hold - Clamshell  - 1 x daily - 7 x weekly - 1 sets - 10 reps - Forward Step Up  - 1 x daily - 7 x weekly - 1 sets - 10 reps - Forward Step Down  - 1 x daily - 7 x weekly - 1 sets - 10 reps - Step Taps on High Step  - 1 x daily - 7 x weekly - 1 sets - 10 reps - Single Leg 3 Way Reach with Support  - 1 x daily - 7 x weekly - 1 sets - 10 reps - Standing Isometric Hip Abduction with Ball on Wall  - 1 x daily - 7 x weekly - 1 sets - 10 reps - Standing Hamstring Stretch with Step  - 1 x daily - 7 x weekly - 1 sets - 10 reps - Seated March with Ankle Weights at Foot  - 1 x daily - 7 x weekly - 3 sets - 10 reps - Quarter Squat with Table  - 1 x daily - 7 x weekly - 1 sets - 10 reps - Standing Hamstring Stretch with Step  - 1 x daily - 7 x weekly - 1 sets - 10 reps - Seated March with Ankle Weights at Foot  - 1 x daily - 7 x weekly - 3 sets - 10 reps - ASSESSMENT:   CLINICAL IMPRESSION:   Citlali is progressing appropriately.  She is having no pain with full range leg extension with 5 lb weights now.  She is able to do 6" step ups with min discomfort.  She  did have some pain with sit to stand today but this was mild and did not worsen with each rep.  Recommend PT for further progression of strengthening with a gradual tapering over the next 8 weeks to promote independence with HEP.     OBJECTIVE IMPAIRMENTS Abnormal gait, decreased mobility, difficulty walking, decreased ROM, decreased strength, increased muscle spasms, impaired flexibility, and pain.    ACTIVITY LIMITATIONS cleaning, community activity, driving, meal prep, occupation, laundry, yard work, and shopping.    PERSONAL FACTORS Age, Fitness, and 1-2 comorbidities: anxiety, bradycardia  are also affecting patient's functional outcome.      REHAB POTENTIAL: Good   CLINICAL DECISION MAKING: Stable/uncomplicated   EVALUATION COMPLEXITY: Low     GOALS: Goals reviewed with patient? Yes   SHORT TERM GOALS: Target date: 02/25/2022  (Remove Blue Hyperlink)   Patient will be independent with initial HEP  Baseline: Goal status: MET   2.  Pain report to be no greater than 4/10  Baseline:  Goal status: MET   3.  Patient to  be able to demonstrate proper heel to toe progression Baseline:  Goal status: MET     LONG TERM GOALS: Target date: 05/16/2022   Patient to be independent with advanced HEP  Baseline:  Goal status: in Progress   2.  Patient to report pain no greater than 2/10  Baseline:  Goal status: In progress   3.  Patient to be able to ascend and descend steps safely without knee instability episodes Baseline:  Goal status: In progress   4.  Patient to be able to bend, stoop and squat with pain no greater than 2/10 in bilateral knees and thighs.  Baseline:  Goal status: INITIAL   5.  Patient to be able to walk 1/2 mile without need to rest.  Baseline:  Goal status: INITIAL       PLAN: PT FREQUENCY: 2x/week   PT DURATION: 8 weeks   PLANNED INTERVENTIONS: Therapeutic exercises, Therapeutic activity, Neuromuscular re-education, Balance training, Gait  training, Patient/Family education, Joint mobilization, Aquatic Therapy, Dry Needling, Electrical stimulation, Cryotherapy, Moist heat, Taping, Vasopneumatic device, Ultrasound, Ionotophoresis 4mg /ml Dexamethasone, and Manual therapy   PLAN FOR NEXT SESSION:  quad strengthening; glute med strengthening; step ups/downs; leg press; hip machine; progressive loading; patient may try a Cho-Pat strap for comfort  Ronnae Kaser B. Lavelle Akel, PT 03/26/22 5:31 PM  [facility] [address] [place], [place] [address] Phone # [phone] Fax [phone]

## 2022-03-28 ENCOUNTER — Ambulatory Visit: Payer: Medicare (Managed Care)

## 2022-03-28 DIAGNOSIS — M25561 Pain in right knee: Secondary | ICD-10-CM | POA: Diagnosis not present

## 2022-03-28 DIAGNOSIS — R252 Cramp and spasm: Secondary | ICD-10-CM

## 2022-03-28 DIAGNOSIS — R262 Difficulty in walking, not elsewhere classified: Secondary | ICD-10-CM

## 2022-03-28 DIAGNOSIS — M6281 Muscle weakness (generalized): Secondary | ICD-10-CM

## 2022-03-28 DIAGNOSIS — G8929 Other chronic pain: Secondary | ICD-10-CM

## 2022-03-28 NOTE — Therapy (Signed)
OUTPATIENT PHYSICAL THERAPY TREATMENT NOTE/RECERTIFICATION   Patient Name: Mercedes Thompson MRN: [id number] DOB:1946-03-09, 76 y.o., female Today's Date: 03/28/2022   REFERRING PROVIDER: Nuala Alpha, MD  END OF SESSION:   PT End of Session - 03/28/22 1447     Visit Number 14    Date for PT Re-Evaluation 05/16/22    Authorization Type [employer] Advantage    Progress Note Due on Visit 25    PT Start Time 1445    PT Stop Time 1017    PT Time Calculation (min) 45 min    Activity Tolerance Patient tolerated treatment well    Behavior During Therapy [facility] for tasks assessed/performed               Past Medical History:  Diagnosis Date   AML (acute myeloblastic leukemia) ([place]) 1987   Anxiety    Appendicitis with peritoneal abscess 03/2015   s/p ex lap at [facility]   Atrial flutter [facility]) 2016   s/p cardioversion 02/2015, previously in [place], recently taken off amiodarone   Bradycardia 2015   s/p PPM insertion   H/O mitral valve replacement with mechanical valve    coumadin   Past Surgical History:  Procedure Laterality Date   APPENDECTOMY  03/24/2015   [facility] Dr Radene Knee.  perforated appendicitis   heart ablation     MITRAL VALVE REPLACEMENT  1986   PACEMAKER INSERTION     TUBAL LIGATION     Patient Active Problem List   Diagnosis Date Noted   Appendicitis with perforation s/p lap appy 03/22/2015 10/29/2015   Intra-abdominal abscess post-lap appy July 2016 10/29/2015   Intra-abdominal abscess ([place]) 10/29/2015   Abscess of abdominal cavity ([place]) 10/29/2015   Artificial cardiac pacemaker 05/17/2015   Sinoatrial node dysfunction ([place]) 05/17/2015   Long term current use of anticoagulant 03/22/2015   Encounter for therapeutic drug level monitoring 03/07/2015   Female genuine stress incontinence 03/25/2013   Anxiety disorder 08/14/2011   Dyslipidemia 08/13/2011   Atrial fibrillation ([place]) 06/11/2011   H/O mitral valve replacement with mechanical valve 06/11/2011     REFERRING DIAG: M79.651 (ICD-10-CM) - Pain in right thigh M79.652 (ICD-10-CM) - Pain in left thigh      THERAPY DIAG: Chronic pain of right knee   Chronic pain of left knee   Difficulty in walking, not elsewhere classified   Muscle weakness (generalized)  PERTINENT HISTORY: Patient reports about 10 years ago she started having difficulty getting out of her low car and then covid hit and she became even more sedentary.  She feels she has gradually gotten weaker and the MD feels she may just be deconditioned.  She admits she did start a new statin drug about 3 to 4 months ago.  She describes her pain as muscle aching in the quads which worsens with activity and sit to stand. She hopes to gain strength and be able to get up and down from lower surfaces with increased ease.   Ankle pain seeing the doctor next week.   PRECAUTIONS: none  SUBJECTIVE:  Patient states she has very little pain with simple walking.  She admits she still gets some lateral pain just distal to right patella but overall much improved.        PAIN:  Are you having pain? Yes NPRS scale: 2/10 Pain location: right medial and lateral knee Aggravating factors: when I first stand up     OBJECTIVE: (objective measures completed at initial evaluation unless otherwise dated)  OBJECTIVE:  DIAGNOSTIC FINDINGS: none   PATIENT SURVEYS:  FOTO 44 (goal 62) 7/6:  63%   COGNITION:           Overall cognitive status: Within functional limits for tasks assessed                          SENSATION: WFL     POSTURE:  Adducted hips, poor alignment with mod valgus knees   PALPATION: Seated flexion and extension    LE ROM:  7/6:  Right and left knee flexion 129 degrees   (Blank rows = not tested)   LE MMT:  7/6:  hip abduction 4+/5    MMT Right 01/28/2022 Left 01/28/2022 7/6  Knee flexion WNL   5  Knee extension WN   4+   (Blank rows = not tested)   LOWER EXTREMITY SPECIAL TESTS:  Knee special tests:  Apley's compression test: negative   FUNCTIONAL TESTS:  5 times sit to stand: 13.10 Timed up and go (TUG): 8.62   7/6: TUG: 7.67 sec 5x sit to stand 12.74   GAIT: Distance walked: 50 ft Assistive device utilized: None Level of assistance: Modified independence Comments: Antalgic, slight trendelenburg    TODAY'S TREATMENT:  03/28/22: NuStep level 3 x 5 min  Leg Press x 20 bilateral with 50 lb, then singles x 20 with 30 lb Multi Hip: hip abd and ext 2 x 10 with 40 lb both Seated LAQ x 20 with 5 lb (did not have to use ball for adduction) No pain Seated march x 20 with 5 lb ankle weights Seated hip ER with 5 lb ankle weights (bottom of foot to opposite ankle with hip abducted) Sit to stand 2 x 10 from chair with 10 lb kb Lateral band walks with blue loop x 3 of 10 steps laps floor (no barre) Seated clam x 20 with blue loop Seated hip adduction with purple ball x 20 Step up and hold on balance pad x 10 ea LE fwd then x 10 ea LE lateral Cone touches 2 x 10 (cone on countertop) 03/26/22: NuStep level 3 x 5 min  Seated LAQ x 20 with 5 lb (did not have to use ball for adduction) No pain Seated march x 20 with 5 lb ankle weights Seated hip ER with 5 lb ankle weights (bottom of foot to opposite ankle with hip abducted) Seated hip flexion combined with abd (as with getting in/out car) with 5 lb ankle weights Sit to stand 2 x 10 from chair with 10 lb kb Lateral band walks with blue loop x 5 laps along barre Seated clam x 20 with blue loop Seated hip adduction with purple ball x 20 Rocker board x 2 min 6 inch lateral step ups 10x each way 6 inch fwd step ups x10 minimal pain Cone touches 2 x 10 (cone on table)  03/21/22: FOTO, TUG, 5x sit to stand, MMT NuStep x 6 min new model seat 6 level 5 Leg press 60# 20x with leg position in slight external rotation for comfort SLS with cone tap on chair (needs some UE support) Hip machine 40# abduction and extension 10x right/left Kickstand  position with 5# single arm dead lifts 10x right/left  Therapeutic activity: walking, standing, sit to stand, balancing     PATIENT EDUCATION:  Access Code: [email or web address] URL: [email or web address] Date: 03/07/2022 Prepared by: Ruben Im  Exercises - Seated Long Arc Quad  - 1 x daily - 7 x  weekly - 3 sets - 10 reps - Seated Hip Abduction  - 1 x daily - 7 x weekly - 3 sets - 10 reps - Sit to Stand Without Arm Support  - 1 x daily - 7 x weekly - 1 sets - 10 reps - Side Stepping with Resistance at Thighs and Counter Support  - 1 x daily - 7 x weekly - 1 sets - 10 reps - Forward and Backward Step Over with Beam (BKA)  - 1 x daily - 7 x weekly - 1 sets - 10 reps - Supine ITB Stretch with Strap  - 1 x daily - 7 x weekly - 1 sets - 3 reps - 30 sec hold - Seated Hip Abduction with Resistance  - 1 x daily - 7 x weekly - 2 sets - 10 reps - Seated Isometric Hip Adduction with Ball  - 1 x daily - 7 x weekly - 2 sets - 10 reps - Supine Figure 4 Piriformis Stretch  - 1 x daily - 7 x weekly - 1 sets - 3 reps - 30 hold - Clamshell  - 1 x daily - 7 x weekly - 1 sets - 10 reps - Forward Step Up  - 1 x daily - 7 x weekly - 1 sets - 10 reps - Forward Step Down  - 1 x daily - 7 x weekly - 1 sets - 10 reps - Step Taps on High Step  - 1 x daily - 7 x weekly - 1 sets - 10 reps - Single Leg 3 Way Reach with Support  - 1 x daily - 7 x weekly - 1 sets - 10 reps - Standing Isometric Hip Abduction with Ball on Wall  - 1 x daily - 7 x weekly - 1 sets - 10 reps - Standing Hamstring Stretch with Step  - 1 x daily - 7 x weekly - 1 sets - 10 reps - Seated March with Ankle Weights at Foot  - 1 x daily - 7 x weekly - 3 sets - 10 reps - Quarter Squat with Table  - 1 x daily - 7 x weekly - 1 sets - 10 reps - Standing Hamstring Stretch with Step  - 1 x daily - 7 x weekly - 1 sets - 10 reps - Seated March with Ankle Weights at Foot  - 1 x daily - 7 x weekly - 3 sets - 10 reps - ASSESSMENT:   CLINICAL  IMPRESSION:   Alyshia is tolerating higher level hip stability and quad rehab now with minimal pain.  She fatigues easily but needed fewer rest breaks today than last session.  Initially, she needed rest break after each exercise and had up to 8/10 pain at times.  She was unable to get any appointments this coming week due to limited availability of appts but is on waitlist.  Recommend PT for further progression of strengthening with a gradual tapering over the next 8 weeks to promote independence with HEP.     OBJECTIVE IMPAIRMENTS Abnormal gait, decreased mobility, difficulty walking, decreased ROM, decreased strength, increased muscle spasms, impaired flexibility, and pain.    ACTIVITY LIMITATIONS cleaning, community activity, driving, meal prep, occupation, laundry, yard work, and shopping.    PERSONAL FACTORS Age, Fitness, and 1-2 comorbidities: anxiety, bradycardia  are also affecting patient's functional outcome.      REHAB POTENTIAL: Good   CLINICAL DECISION MAKING: Stable/uncomplicated   EVALUATION COMPLEXITY: Low     GOALS: Goals  reviewed with patient? Yes   SHORT TERM GOALS: Target date: 02/25/2022  (Remove Blue Hyperlink)   Patient will be independent with initial HEP  Baseline: Goal status: MET   2.  Pain report to be no greater than 4/10  Baseline:  Goal status: MET   3.  Patient to be able to demonstrate proper heel to toe progression Baseline:  Goal status: MET     LONG TERM GOALS: Target date: 05/16/2022   Patient to be independent with advanced HEP  Baseline:  Goal status: in Progress   2.  Patient to report pain no greater than 2/10  Baseline:  Goal status: In progress   3.  Patient to be able to ascend and descend steps safely without knee instability episodes Baseline:  Goal status: In progress   4.  Patient to be able to bend, stoop and squat with pain no greater than 2/10 in bilateral knees and thighs.  Baseline:  Goal status: In Progress   5.   Patient to be able to walk 1/2 mile without need to rest.  Baseline:  Goal status: In Progress       PLAN: PT FREQUENCY: 2x/week   PT DURATION: 8 weeks   PLANNED INTERVENTIONS: Therapeutic exercises, Therapeutic activity, Neuromuscular re-education, Balance training, Gait training, Patient/Family education, Joint mobilization, Aquatic Therapy, Dry Needling, Electrical stimulation, Cryotherapy, Moist heat, Taping, Vasopneumatic device, Ultrasound, Ionotophoresis 71m/ml Dexamethasone, and Manual therapy   PLAN FOR NEXT SESSION:  quad strengthening; glute med strengthening; step ups/downs; leg press; hip machine; progressive loading; patient may try a Cho-Pat strap for comfort  Elric Tirado B. Aynslee Mulhall, PT 03/28/22 4:15 PM  [facility] [place] [place] [place] [id number] # [phone] [phone]

## 2022-03-29 DIAGNOSIS — Z7901 Long term (current) use of anticoagulants: Secondary | ICD-10-CM | POA: Diagnosis not present

## 2022-04-02 ENCOUNTER — Ambulatory Visit: Payer: Medicare (Managed Care)

## 2022-04-02 DIAGNOSIS — R252 Cramp and spasm: Secondary | ICD-10-CM

## 2022-04-02 DIAGNOSIS — G8929 Other chronic pain: Secondary | ICD-10-CM

## 2022-04-02 DIAGNOSIS — M6281 Muscle weakness (generalized): Secondary | ICD-10-CM

## 2022-04-02 DIAGNOSIS — R262 Difficulty in walking, not elsewhere classified: Secondary | ICD-10-CM

## 2022-04-02 DIAGNOSIS — M25561 Pain in right knee: Secondary | ICD-10-CM | POA: Diagnosis not present

## 2022-04-02 NOTE — Therapy (Signed)
OUTPATIENT PHYSICAL THERAPY TREATMENT NOTE/RECERTIFICATION   Patient Name: Mariabella Nilsen MRN: [id number] DOB:04-21-1946, 76 y.o., female Today's Date: 04/02/2022   REFERRING PROVIDER: Nuala Alpha, MD  END OF SESSION:   PT End of Session - 04/02/22 1537     Visit Number 15    Date for PT Re-Evaluation 05/16/22    Authorization Type [employer]vantage    Progress Note Due on Visit 42    PT Start Time 1530    PT Stop Time 1615    PT Time Calculation (min) 45 min    Activity Tolerance Patient tolerated treatment well    Behavior During Therapy [facility] for tasks assessed/performed               Past Medical History:  Diagnosis Date   AML (acute myeloblastic leukemia) ([place]) 1987   Anxiety    Appendicitis with peritoneal abscess 03/2015   s/p ex lap at [facility]   Atrial flutter [facility]) 2016   s/p cardioversion 02/2015, previously in [place], recently taken off amiodarone   Bradycardia 2015   s/p PPM insertion   H/O mitral valve replacement with mechanical valve    coumadin   Past Surgical History:  Procedure Laterality Date   APPENDECTOMY  03/24/2015   [facility] Dr Radene Knee.  perforated appendicitis   heart ablation     MITRAL VALVE REPLACEMENT  1986   PACEMAKER INSERTION     TUBAL LIGATION     Patient Active Problem List   Diagnosis Date Noted   Appendicitis with perforation s/p lap appy 03/22/2015 10/29/2015   Intra-abdominal abscess post-lap appy July 2016 10/29/2015   Intra-abdominal abscess ([place]) 10/29/2015   Abscess of abdominal cavity ([place]) 10/29/2015   Artificial cardiac pacemaker 05/17/2015   Sinoatrial node dysfunction ([place]) 05/17/2015   Long term current use of anticoagulant 03/22/2015   Encounter for therapeutic drug level monitoring 03/07/2015   Female genuine stress incontinence 03/25/2013   Anxiety disorder 08/14/2011   Dyslipidemia 08/13/2011   Atrial fibrillation ([place]) 06/11/2011   H/O mitral valve replacement with mechanical valve 06/11/2011     REFERRING DIAG: M79.651 (ICD-10-CM) - Pain in right thigh M79.652 (ICD-10-CM) - Pain in left thigh      THERAPY DIAG: Chronic pain of right knee   Chronic pain of left knee   Difficulty in walking, not elsewhere classified   Muscle weakness (generalized)  PERTINENT HISTORY: Patient reports about 10 years ago she started having difficulty getting out of her low car and then covid hit and she became even more sedentary.  She feels she has gradually gotten weaker and the MD feels she may just be deconditioned.  She admits she did start a new statin drug about 3 to 4 months ago.  She describes her pain as muscle aching in the quads which worsens with activity and sit to stand. She hopes to gain strength and be able to get up and down from lower surfaces with increased ease.   Ankle pain seeing the doctor next week.   PRECAUTIONS: none  SUBJECTIVE:  Patient states she is still doing well but has noticed her leg aching symptoms that she had with the other statin drug.  She is concerned that the new cholesterol med is starting to cause her leg pain again.    PAIN:  Are you having pain? Yes NPRS scale: 2/10 Pain location: right medial and lateral knee Aggravating factors: when I first stand up     OBJECTIVE: (objective measures completed at initial evaluation  unless otherwise dated)  OBJECTIVE:    DIAGNOSTIC FINDINGS: none   PATIENT SURVEYS:  FOTO 44 (goal 80) 7/6:  63%   COGNITION:           Overall cognitive status: Within functional limits for tasks assessed                          SENSATION: WFL     POSTURE:  Adducted hips, poor alignment with mod valgus knees   PALPATION: Seated flexion and extension    LE ROM:  7/6:  Right and left knee flexion 129 degrees   (Blank rows = not tested)   LE MMT:  7/6:  hip abduction 4+/5    MMT Right 01/28/2022 Left 01/28/2022 7/6  Knee flexion WNL   5  Knee extension WN   4+   (Blank rows = not tested)   LOWER  EXTREMITY SPECIAL TESTS:  Knee special tests: Apley's compression test: negative   FUNCTIONAL TESTS:  5 times sit to stand: 13.10 Timed up and go (TUG): 8.62   7/6: TUG: 7.67 sec 5x sit to stand 12.74   GAIT: Distance walked: 50 ft Assistive device utilized: None Level of assistance: Modified independence Comments: Antalgic, slight trendelenburg    TODAY'S TREATMENT:  04/02/22: NuStep level 3 x 5 min  Seated LAQ x 20 with 5 lb (did not have to use ball for adduction) No pain Seated march x 20 with 5 lb ankle weights Seated hip ER with 5 lb ankle weights (bottom of foot to opposite ankle with hip abducted) Sit to stand 2 x 10 from chair with 10 lb kb Lateral band walks with blue loop x 3 of 10 steps laps floor (no barre) Seated clam x 20 with blue loop Seated hip adduction with purple ball x 20  Seated hamstring curls x 20 with red band Leg Press x 20 bilateral with 60 lb, then singles x 20 with 30 lb Multi Hip: hip abd and ext 2 x 10 with 40 lb both Step up and hold on balance pad x 10 ea LE fwd then x 10 ea LE lateral Cone touches 2 x 10 (cone on countertop)   03/28/22: NuStep level 3 x 5 min  Leg Press x 20 bilateral with 50 lb, then singles x 20 with 30 lb Multi Hip: hip abd and ext 2 x 10 with 40 lb both Seated LAQ x 20 with 5 lb (did not have to use ball for adduction) No pain Seated march x 20 with 5 lb ankle weights Seated hip ER with 5 lb ankle weights (bottom of foot to opposite ankle with hip abducted) Sit to stand 2 x 10 from chair with 10 lb kb Lateral band walks with blue loop x 3 of 10 steps laps floor (no barre) Seated clam x 20 with blue loop Seated hip adduction with purple ball x 20 Step up and hold on balance pad x 10 ea LE fwd then x 10 ea LE lateral Cone touches 2 x 10 (cone on countertop) 03/26/22: NuStep level 3 x 5 min  Seated LAQ x 20 with 5 lb (did not have to use ball for adduction) No pain Seated march x 20 with 5 lb ankle weights Seated  hip ER with 5 lb ankle weights (bottom of foot to opposite ankle with hip abducted) Seated hip flexion combined with abd (as with getting in/out car) with 5 lb ankle weights Sit to stand  2 x 10 from chair with 10 lb kb Lateral band walks with blue loop x 5 laps along barre Seated clam x 20 with blue loop Seated hip adduction with purple ball x 20 Rocker board x 2 min 6 inch lateral step ups 10x each way 6 inch fwd step ups x10 minimal pain Cone touches 2 x 10 (cone on table)     PATIENT EDUCATION:  Access Code: [email or web address] URL: [email or web address] Date: 03/07/2022 Prepared by: Ruben Im  Exercises - Seated Long Arc Quad  - 1 x daily - 7 x weekly - 3 sets - 10 reps - Seated Hip Abduction  - 1 x daily - 7 x weekly - 3 sets - 10 reps - Sit to Stand Without Arm Support  - 1 x daily - 7 x weekly - 1 sets - 10 reps - Side Stepping with Resistance at Thighs and Counter Support  - 1 x daily - 7 x weekly - 1 sets - 10 reps - Forward and Backward Step Over with Beam (BKA)  - 1 x daily - 7 x weekly - 1 sets - 10 reps - Supine ITB Stretch with Strap  - 1 x daily - 7 x weekly - 1 sets - 3 reps - 30 sec hold - Seated Hip Abduction with Resistance  - 1 x daily - 7 x weekly - 2 sets - 10 reps - Seated Isometric Hip Adduction with Ball  - 1 x daily - 7 x weekly - 2 sets - 10 reps - Supine Figure 4 Piriformis Stretch  - 1 x daily - 7 x weekly - 1 sets - 3 reps - 30 hold - Clamshell  - 1 x daily - 7 x weekly - 1 sets - 10 reps - Forward Step Up  - 1 x daily - 7 x weekly - 1 sets - 10 reps - Forward Step Down  - 1 x daily - 7 x weekly - 1 sets - 10 reps - Step Taps on High Step  - 1 x daily - 7 x weekly - 1 sets - 10 reps - Single Leg 3 Way Reach with Support  - 1 x daily - 7 x weekly - 1 sets - 10 reps - Standing Isometric Hip Abduction with Ball on Wall  - 1 x daily - 7 x weekly - 1 sets - 10 reps - Standing Hamstring Stretch with Step  - 1 x daily - 7 x weekly - 1 sets - 10  reps - Seated March with Ankle Weights at Foot  - 1 x daily - 7 x weekly - 3 sets - 10 reps - Quarter Squat with Table  - 1 x daily - 7 x weekly - 1 sets - 10 reps - Standing Hamstring Stretch with Step  - 1 x daily - 7 x weekly - 1 sets - 10 reps - Seated March with Ankle Weights at Foot  - 1 x daily - 7 x weekly - 3 sets - 10 reps - ASSESSMENT:   CLINICAL IMPRESSION:   Azarria is progressing appropriately.  She is gaining more understanding of her LE postural issues and is able to correct her alignment without excessive verbal cues.  She is compliant and well motivated.  She would benefit from continued skilled PT for quad rehab and hip strengthening as well as alignment training.    OBJECTIVE IMPAIRMENTS Abnormal gait, decreased mobility, difficulty walking, decreased ROM, decreased strength, increased muscle spasms, impaired flexibility, and pain.  ACTIVITY LIMITATIONS cleaning, community activity, driving, meal prep, occupation, laundry, yard work, and shopping.    PERSONAL FACTORS Age, Fitness, and 1-2 comorbidities: anxiety, bradycardia  are also affecting patient's functional outcome.      REHAB POTENTIAL: Good   CLINICAL DECISION MAKING: Stable/uncomplicated   EVALUATION COMPLEXITY: Low     GOALS: Goals reviewed with patient? Yes   SHORT TERM GOALS: Target date: 02/25/2022  (Remove Blue Hyperlink)   Patient will be independent with initial HEP  Baseline: Goal status: MET   2.  Pain report to be no greater than 4/10  Baseline:  Goal status: MET   3.  Patient to be able to demonstrate proper heel to toe progression Baseline:  Goal status: MET     LONG TERM GOALS: Target date: 05/16/2022   Patient to be independent with advanced HEP  Baseline:  Goal status: in Progress   2.  Patient to report pain no greater than 2/10  Baseline:  Goal status: In progress   3.  Patient to be able to ascend and descend steps safely without knee instability episodes Baseline:   Goal status: In progress   4.  Patient to be able to bend, stoop and squat with pain no greater than 2/10 in bilateral knees and thighs.  Baseline:  Goal status: In Progress   5.  Patient to be able to walk 1/2 mile without need to rest.  Baseline:  Goal status: In Progress       PLAN: PT FREQUENCY: 2x/week   PT DURATION: 8 weeks   PLANNED INTERVENTIONS: Therapeutic exercises, Therapeutic activity, Neuromuscular re-education, Balance training, Gait training, Patient/Family education, Joint mobilization, Aquatic Therapy, Dry Needling, Electrical stimulation, Cryotherapy, Moist heat, Taping, Vasopneumatic device, Ultrasound, Ionotophoresis 4mg /ml Dexamethasone, and Manual therapy   PLAN FOR NEXT SESSION:  quad strengthening; glute med strengthening; step ups/downs; leg press; hip machine; progressive loading; patient may try a Cho-Pat strap for comfort  Johnella Crumm B. Iyanah Demont, PT 04/02/22 4:14 PM  [facility] [address], [address] [place],  [address] Phone # [phone] Fax [phone]

## 2022-04-04 ENCOUNTER — Ambulatory Visit: Payer: Medicare (Managed Care)

## 2022-04-10 ENCOUNTER — Ambulatory Visit: Payer: Medicare (Managed Care)

## 2022-04-10 DIAGNOSIS — G8929 Other chronic pain: Secondary | ICD-10-CM

## 2022-04-10 DIAGNOSIS — M25561 Pain in right knee: Secondary | ICD-10-CM | POA: Diagnosis not present

## 2022-04-10 DIAGNOSIS — R262 Difficulty in walking, not elsewhere classified: Secondary | ICD-10-CM

## 2022-04-10 DIAGNOSIS — R252 Cramp and spasm: Secondary | ICD-10-CM

## 2022-04-10 DIAGNOSIS — M6281 Muscle weakness (generalized): Secondary | ICD-10-CM

## 2022-04-10 NOTE — Therapy (Signed)
OUTPATIENT PHYSICAL THERAPY TREATMENT NOTE/RECERTIFICATION   Patient Name: Mercedes Thompson MRN: [id number] DOB:[birth year], 76 y.o., female Today's Date: 04/10/2022   REFERRING PROVIDER: Nuala Alpha, MD  END OF SESSION:   PT End of Session - 04/10/22 0935     Visit Number 16    Date for PT Re-Evaluation 05/16/22    Authorization Type [employer] Advantage    Progress Note Due on Visit 39    PT Start Time 0933    PT Stop Time 1015    PT Time Calculation (min) 42 min    Activity Tolerance Patient tolerated treatment well    Behavior During Therapy [facility] for tasks assessed/performed               Past Medical History:  Diagnosis Date   AML (acute myeloblastic leukemia) ([place]) 1987   Anxiety    Appendicitis with peritoneal abscess 03/2015   [facility]   Atrial flutter [facility]) 2016   s/p cardioversion 02/2015, previously in [place], recently taken off amiodarone   Bradycardia 2015   s/p PPM insertion   H/O mitral valve replacement with mechanical valve    coumadin   Past Surgical History:  Procedure Laterality Date   APPENDECTOMY  03/24/2015   [facility] Dr Radene Knee.  perforated appendicitis   heart ablation     MITRAL VALVE REPLACEMENT  1986   PACEMAKER INSERTION     TUBAL LIGATION     Patient Active Problem List   Diagnosis Date Noted   Appendicitis with perforation s/p lap appy 03/22/2015 10/29/2015   Intra-abdominal abscess post-lap appy July 2016 10/29/2015   Intra-abdominal abscess ([place]) 10/29/2015   Abscess of abdominal cavity ([place]) 10/29/2015   Artificial cardiac pacemaker 05/17/2015   Sinoatrial node dysfunction ([place]) 05/17/2015   Long term current use of anticoagulant 03/22/2015   Encounter for therapeutic drug level monitoring 03/07/2015   Female genuine stress incontinence 03/25/2013   Anxiety disorder 08/14/2011   Dyslipidemia 08/13/2011   Atrial fibrillation ([place]) 06/11/2011   H/O mitral valve replacement with mechanical valve 06/11/2011     REFERRING DIAG: M79.651 (ICD-10-CM) - Pain in right thigh M79.652 (ICD-10-CM) - Pain in left thigh      THERAPY DIAG: Chronic pain of right knee   Chronic pain of left knee   Difficulty in walking, not elsewhere classified   Muscle weakness (generalized)  PERTINENT HISTORY: Patient reports about 10 years ago she started having difficulty getting out of her low car and then covid hit and she became even more sedentary.  She feels she has gradually gotten weaker and the MD feels she may just be deconditioned.  She admits she did start a new statin drug about 3 to 4 months ago.  She describes her pain as muscle aching in the quads which worsens with activity and sit to stand. She hopes to gain strength and be able to get up and down from lower surfaces with increased ease.   Ankle pain seeing the doctor next week.   PRECAUTIONS: none  SUBJECTIVE:  Patient states she is at 4/10.  The statin type leg pain seems to be about the same as last visit.  She explains that when she is doing the IT band stretch on the right, she continues to experience discomfort at the distal part of the IT band around the lateral aspect of the knee.    PAIN:  Are you having pain? Yes NPRS scale: 4/10 Pain location: right medial and lateral knee Aggravating  factors: when I first stand up     OBJECTIVE: (objective measures completed at initial evaluation unless otherwise dated)  OBJECTIVE:    DIAGNOSTIC FINDINGS: none   PATIENT SURVEYS:  FOTO 44 (goal 41) 7/6:  63%   COGNITION:           Overall cognitive status: Within functional limits for tasks assessed                          SENSATION: WFL     POSTURE:  Adducted hips, poor alignment with mod valgus knees   PALPATION: Seated flexion and extension    LE ROM:  7/6:  Right and left knee flexion 129 degrees   (Blank rows = not tested)   LE MMT:  7/6:  hip abduction 4+/5    MMT Right 01/28/2022 Left 01/28/2022 7/6  Knee flexion WNL    5  Knee extension WN   4+   (Blank rows = not tested)   LOWER EXTREMITY SPECIAL TESTS:  Knee special tests: Apley's compression test: negative   FUNCTIONAL TESTS:  5 times sit to stand: 13.10 Timed up and go (TUG): 8.62   7/6: TUG: 7.67 sec 5x sit to stand 12.74   GAIT: Distance walked: 50 ft Assistive device utilized: None Level of assistance: Modified independence Comments: Antalgic, slight trendelenburg    TODAY'S TREATMENT:  04/02/22: NuStep level 3 x 5 min  Seated LAQ x 20 with 6 lb (did not have to use ball for adduction) No pain Seated march x 20 with 6 lb ankle weights Seated hip ER with 5 lb ankle weights (bottom of foot to opposite ankle with hip abducted) Sit to stand x 10 from mat table with 10 lb kb Squat to table x 10 with 10 lb kb (patient had a difficult time with squat to table- modified to add balance pad under) Lateral band walks with blue loop x 5 laps of 10 steps  floor ( at barre) Seated clam x 20 with blue loop Leg Press x 20 bilateral with 70 lb, then singles x 20 with 40 lb Multi Hip: hip abd and ext 2 x 10 with 40 lb both Step up and hold on balance pad x 10 ea LE fwd Step up onto 4" step x 10 each ( patient had 2 episodes of pain with knee instability during this with right knee) Ice to right knee x 10 min   04/02/22: NuStep level 3 x 5 min  Seated LAQ x 20 with 6 lb (did not have to use ball for adduction) No pain Seated march x 20 with 6 lb ankle weights Seated hip ER with 5 lb ankle weights (bottom of foot to opposite ankle with hip abducted) Sit to stand 2 x 10 from chair with 10 lb kb Lateral band walks with blue loop x 3 of 10 steps laps floor (no barre) Seated clam x 20 with blue loop Seated hip adduction with purple ball x 20  Seated hamstring curls x 20 with red band Leg Press x 20 bilateral with 60 lb, then singles x 20 with 30 lb Multi Hip: hip abd and ext 2 x 10 with 40 lb both Step up and hold on balance pad x 10 ea LE fwd then  x 10 ea LE lateral Cone touches 2 x 10 (cone on countertop)   03/28/22: NuStep level 3 x 5 min  Leg Press x 20 bilateral with 50 lb, then  singles x 20 with 30 lb Multi Hip: hip abd and ext 2 x 10 with 40 lb both Seated LAQ x 20 with 5 lb (did not have to use ball for adduction) No pain Seated march x 20 with 5 lb ankle weights Seated hip ER with 5 lb ankle weights (bottom of foot to opposite ankle with hip abducted) Sit to stand 2 x 10 from chair with 10 lb kb Lateral band walks with blue loop x 3 of 10 steps laps floor (no barre) Seated clam x 20 with blue loop Seated hip adduction with purple ball x 20 Step up and hold on balance pad x 10 ea LE fwd then x 10 ea LE lateral Cone touches 2 x 10 (cone on countertop) 03/26/22: NuStep level 3 x 5 min  Seated LAQ x 20 with 5 lb (did not have to use ball for adduction) No pain Seated march x 20 with 5 lb ankle weights Seated hip ER with 5 lb ankle weights (bottom of foot to opposite ankle with hip abducted) Seated hip flexion combined with abd (as with getting in/out car) with 5 lb ankle weights Sit to stand 2 x 10 from chair with 10 lb kb Lateral band walks with blue loop x 5 laps along barre Seated clam x 20 with blue loop Seated hip adduction with purple ball x 20 Rocker board x 2 min 6 inch lateral step ups 10x each way 6 inch fwd step ups x10 minimal pain Cone touches 2 x 10 (cone on table)     PATIENT EDUCATION:  Access Code: [email or web address] URL: [email or web address] Date: 03/07/2022 Prepared by: Ruben Im  Exercises - Seated Long Arc Quad  - 1 x daily - 7 x weekly - 3 sets - 10 reps - Seated Hip Abduction  - 1 x daily - 7 x weekly - 3 sets - 10 reps - Sit to Stand Without Arm Support  - 1 x daily - 7 x weekly - 1 sets - 10 reps - Side Stepping with Resistance at Thighs and Counter Support  - 1 x daily - 7 x weekly - 1 sets - 10 reps - Forward and Backward Step Over with Beam (BKA)  - 1 x daily - 7 x weekly - 1  sets - 10 reps - Supine ITB Stretch with Strap  - 1 x daily - 7 x weekly - 1 sets - 3 reps - 30 sec hold - Seated Hip Abduction with Resistance  - 1 x daily - 7 x weekly - 2 sets - 10 reps - Seated Isometric Hip Adduction with Ball  - 1 x daily - 7 x weekly - 2 sets - 10 reps - Supine Figure 4 Piriformis Stretch  - 1 x daily - 7 x weekly - 1 sets - 3 reps - 30 hold - Clamshell  - 1 x daily - 7 x weekly - 1 sets - 10 reps - Forward Step Up  - 1 x daily - 7 x weekly - 1 sets - 10 reps - Forward Step Down  - 1 x daily - 7 x weekly - 1 sets - 10 reps - Step Taps on High Step  - 1 x daily - 7 x weekly - 1 sets - 10 reps - Single Leg 3 Way Reach with Support  - 1 x daily - 7 x weekly - 1 sets - 10 reps - Standing Isometric Hip Abduction with Ball on Wall  - 1 x daily -  7 x weekly - 1 sets - 10 reps - Standing Hamstring Stretch with Step  - 1 x daily - 7 x weekly - 1 sets - 10 reps - Seated March with Ankle Weights at Foot  - 1 x daily - 7 x weekly - 3 sets - 10 reps - Quarter Squat with Table  - 1 x daily - 7 x weekly - 1 sets - 10 reps - Standing Hamstring Stretch with Step  - 1 x daily - 7 x weekly - 1 sets - 10 reps - Seated March with Ankle Weights at Foot  - 1 x daily - 7 x weekly - 3 sets - 10 reps - ASSESSMENT:   CLINICAL IMPRESSION:   Haelyn had some pain and instability on right knee with step up on 4" but this was with full weight on right type step up (toe tap only in back).  She was able to do normal 4" step up with ease.  She continues to have severe right knee valgus which will likely result in eventual need for TKA.  She has made excellent gains in function despite this knee, however.    She would benefit from continued skilled PT for quad rehab and hip strengthening as well as alignment training.    OBJECTIVE IMPAIRMENTS Abnormal gait, decreased mobility, difficulty walking, decreased ROM, decreased strength, increased muscle spasms, impaired flexibility, and pain.    ACTIVITY  LIMITATIONS cleaning, community activity, driving, meal prep, occupation, laundry, yard work, and shopping.    PERSONAL FACTORS Age, Fitness, and 1-2 comorbidities: anxiety, bradycardia  are also affecting patient's functional outcome.      REHAB POTENTIAL: Good   CLINICAL DECISION MAKING: Stable/uncomplicated   EVALUATION COMPLEXITY: Low     GOALS: Goals reviewed with patient? Yes   SHORT TERM GOALS: Target date: 02/25/2022  (Remove Blue Hyperlink)   Patient will be independent with initial HEP  Baseline: Goal status: MET   2.  Pain report to be no greater than 4/10  Baseline:  Goal status: MET   3.  Patient to be able to demonstrate proper heel to toe progression Baseline:  Goal status: MET     LONG TERM GOALS: Target date: 05/16/2022   Patient to be independent with advanced HEP  Baseline:  Goal status: in Progress   2.  Patient to report pain no greater than 2/10  Baseline:  Goal status: In progress   3.  Patient to be able to ascend and descend steps safely without knee instability episodes Baseline:  Goal status: In progress   4.  Patient to be able to bend, stoop and squat with pain no greater than 2/10 in bilateral knees and thighs.  Baseline:  Goal status: In Progress   5.  Patient to be able to walk 1/2 mile without need to rest.  Baseline:  Goal status: In Progress       PLAN: PT FREQUENCY: 2x/week   PT DURATION: 8 weeks   PLANNED INTERVENTIONS: Therapeutic exercises, Therapeutic activity, Neuromuscular re-education, Balance training, Gait training, Patient/Family education, Joint mobilization, Aquatic Therapy, Dry Needling, Electrical stimulation, Cryotherapy, Moist heat, Taping, Vasopneumatic device, Ultrasound, Ionotophoresis 60m/ml Dexamethasone, and Manual therapy   PLAN FOR NEXT SESSION:  quad strengthening; glute med strengthening; step ups/downs; leg press; hip machine; progressive loading; patient may try a Cho-Pat strap for  comfort  Jarvis Knodel B. Admir Candelas, PT 04/10/22 11:15 AM  [facility] [address] [place] [address]Phone # [phone]  [phone]

## 2022-04-12 ENCOUNTER — Ambulatory Visit: Payer: Medicare (Managed Care)

## 2022-04-12 DIAGNOSIS — R262 Difficulty in walking, not elsewhere classified: Secondary | ICD-10-CM

## 2022-04-12 DIAGNOSIS — M25562 Pain in left knee: Secondary | ICD-10-CM

## 2022-04-12 DIAGNOSIS — G8929 Other chronic pain: Secondary | ICD-10-CM

## 2022-04-12 DIAGNOSIS — R252 Cramp and spasm: Secondary | ICD-10-CM

## 2022-04-12 DIAGNOSIS — M6281 Muscle weakness (generalized): Secondary | ICD-10-CM

## 2022-04-12 DIAGNOSIS — M25561 Pain in right knee: Secondary | ICD-10-CM | POA: Diagnosis not present

## 2022-04-12 NOTE — Therapy (Signed)
OUTPATIENT PHYSICAL THERAPY TREATMENT NOTE/RECERTIFICATION   Patient Name: Mercedes Thompson MRN: [id number] DOB:10/22/1945, 76 y.o., female Today's Date: 04/12/2022   REFERRING PROVIDER: Nuala Alpha, MD  END OF SESSION:   PT End of Session - 04/12/22 0850     Visit Number 17    Date for PT Re-Evaluation 05/16/22    Authorization Type [employer]vantage    Progress Note Due on Visit 59    PT Start Time 0845    PT Stop Time 0924    PT Time Calculation (min) 39 min    Activity Tolerance Patient tolerated treatment well    Behavior During Therapy [facility] for tasks assessed/performed               Past Medical History:  Diagnosis Date   AML (acute myeloblastic leukemia) ([place]) 1987   Anxiety    Appendicitis with peritoneal abscess 03/2015   s/p ex lap at [facility]   Atrial flutter [facility]) 2016   s/p cardioversion 02/2015, previously in [place], recently taken off amiodarone   Bradycardia 2015   s/p PPM insertion   H/O mitral valve replacement with mechanical valve    coumadin   Past Surgical History:  Procedure Laterality Date   APPENDECTOMY  03/24/2015   [facility] Dr Radene Knee.  perforated appendicitis   heart ablation     MITRAL VALVE REPLACEMENT  1986   PACEMAKER INSERTION     TUBAL LIGATION     Patient Active Problem List   Diagnosis Date Noted   Appendicitis with perforation s/p lap appy 03/22/2015 10/29/2015   Intra-abdominal abscess post-lap appy July 2016 10/29/2015   Intra-abdominal abscess ([place]) 10/29/2015   Abscess of abdominal cavity ([place]) 10/29/2015   Artificial cardiac pacemaker 05/17/2015   Sinoatrial node dysfunction ([place]) 05/17/2015   Long term current use of anticoagulant 03/22/2015   Encounter for therapeutic drug level monitoring 03/07/2015   Female genuine stress incontinence 03/25/2013   Anxiety disorder 08/14/2011   Dyslipidemia 08/13/2011   Atrial fibrillation ([place]) 06/11/2011   H/O mitral valve replacement with mechanical valve 06/11/2011     REFERRING DIAG: M79.651 (ICD-10-CM) - Pain in right thigh M79.652 (ICD-10-CM) - Pain in left thigh      THERAPY DIAG: Chronic pain of right knee   Chronic pain of left knee   Difficulty in walking, not elsewhere classified   Muscle weakness (generalized)  PERTINENT HISTORY: Patient reports about 10 years ago she started having difficulty getting out of her low car and then covid hit and she became even more sedentary.  She feels she has gradually gotten weaker and the MD feels she may just be deconditioned.  She admits she did start a new statin drug about 3 to 4 months ago.  She describes her pain as muscle aching in the quads which worsens with activity and sit to stand. She hopes to gain strength and be able to get up and down from lower surfaces with increased ease.   Ankle pain seeing the doctor next week.   PRECAUTIONS: none  SUBJECTIVE:  Patient states she is at 4/10.  The statin type leg pain seems to be about the same as last visit.  She explains that when she is doing the IT band stretch on the right, she continues to experience discomfort at the distal part of the IT band around the lateral aspect of the knee.    PAIN:  Are you having pain? Yes NPRS scale: 4/10 Pain location: right medial and lateral knee Aggravating  factors: when I first stand up     OBJECTIVE: (objective measures completed at initial evaluation unless otherwise dated)  OBJECTIVE:    DIAGNOSTIC FINDINGS: none   PATIENT SURVEYS:  FOTO 44 (goal 38) 7/6:  63%   COGNITION:           Overall cognitive status: Within functional limits for tasks assessed                          SENSATION: WFL     POSTURE:  Adducted hips, poor alignment with mod valgus knees   PALPATION: Seated flexion and extension    LE ROM:  7/6:  Right and left knee flexion 129 degrees   (Blank rows = not tested)   LE MMT:  7/6:  hip abduction 4+/5    MMT Right 01/28/2022 Left 01/28/2022 7/6  Knee flexion WNL    5  Knee extension WN   4+   (Blank rows = not tested)   LOWER EXTREMITY SPECIAL TESTS:  Knee special tests: Apley's compression test: negative   FUNCTIONAL TESTS:  5 times sit to stand: 13.10 Timed up and go (TUG): 8.62   7/6: TUG: 7.67 sec 5x sit to stand 12.74   GAIT: Distance walked: 50 ft Assistive device utilized: None Level of assistance: Modified independence Comments: Antalgic, slight trendelenburg    TODAY'S TREATMENT:  04/12/22: NuStep level 3 x 5 min  Lateral band walks with blue loop x 5 laps of 10 steps  floor ( at barre) Standing hip ER in static stand with slightly flexed knees x 20 with blue loop Sit to stand 2 x 10 from chair Quad progression: Supine QS x 20, TKE over small green noodle x 20, TKE over blue foam roller x 20 with 5 lb ankle weight, SAQ x 20 with 5 lb, SLR x 20 with 5 lb, LAQ x 20 with 5 lb (all both sides) Seated march x 20 with 6 lb ankle weights Seated hip ER with 5 lb ankle weights (bottom of foot to opposite ankle with hip abducted) both Leg Press x 20 bilateral with 70 lb, then singles x 20 with 40 lb Multi Hip: hip abd and ext 2 x 10 with 40 lb both Cone touches 3 x 10 both SLS ball toss against wall attempted 10 bounces per LE x 3 each   04/10/22: NuStep level 3 x 5 min  Seated LAQ x 20 with 6 lb (did not have to use ball for adduction) No pain Seated march x 20 with 6 lb ankle weights Seated hip ER with 5 lb ankle weights (bottom of foot to opposite ankle with hip abducted) Sit to stand x 10 from mat table with 10 lb kb Squat to table x 10 with 10 lb kb (patient had a difficult time with squat to table- modified to add balance pad under) Lateral band walks with blue loop x 5 laps of 10 steps  floor ( at barre) Seated clam x 20 with blue loop Leg Press x 20 bilateral with 70 lb, then singles x 20 with 40 lb Multi Hip: hip abd and ext 2 x 10 with 40 lb both Step up and hold on balance pad x 10 ea LE fwd Step up onto 4" step x 10  each ( patient had 2 episodes of pain with knee instability during this with right knee) Ice to right knee x 10 min   04/02/22: NuStep level 3 x  5 min  Seated LAQ x 20 with 6 lb (did not have to use ball for adduction) No pain Seated march x 20 with 6 lb ankle weights Seated hip ER with 5 lb ankle weights (bottom of foot to opposite ankle with hip abducted) Sit to stand 2 x 10 from chair with 10 lb kb Lateral band walks with blue loop x 3 of 10 steps laps floor (no barre) Seated clam x 20 with blue loop Seated hip adduction with purple ball x 20  Seated hamstring curls x 20 with red band Leg Press x 20 bilateral with 60 lb, then singles x 20 with 30 lb Multi Hip: hip abd and ext 2 x 10 with 40 lb both Step up and hold on balance pad x 10 ea LE fwd then x 10 ea LE lateral Cone touches 2 x 10 (cone on countertop)  PATIENT EDUCATION:  Access Code: [email or web address] URL: [email or web address] Date: 03/07/2022 Prepared by: Ruben Im  Exercises - Seated Long Arc Quad  - 1 x daily - 7 x weekly - 3 sets - 10 reps - Seated Hip Abduction  - 1 x daily - 7 x weekly - 3 sets - 10 reps - Sit to Stand Without Arm Support  - 1 x daily - 7 x weekly - 1 sets - 10 reps - Side Stepping with Resistance at Thighs and Counter Support  - 1 x daily - 7 x weekly - 1 sets - 10 reps - Forward and Backward Step Over with Beam (BKA)  - 1 x daily - 7 x weekly - 1 sets - 10 reps - Supine ITB Stretch with Strap  - 1 x daily - 7 x weekly - 1 sets - 3 reps - 30 sec hold - Seated Hip Abduction with Resistance  - 1 x daily - 7 x weekly - 2 sets - 10 reps - Seated Isometric Hip Adduction with Ball  - 1 x daily - 7 x weekly - 2 sets - 10 reps - Supine Figure 4 Piriformis Stretch  - 1 x daily - 7 x weekly - 1 sets - 3 reps - 30 hold - Clamshell  - 1 x daily - 7 x weekly - 1 sets - 10 reps - Forward Step Up  - 1 x daily - 7 x weekly - 1 sets - 10 reps - Forward Step Down  - 1 x daily - 7 x weekly - 1 sets - 10  reps - Step Taps on High Step  - 1 x daily - 7 x weekly - 1 sets - 10 reps - Single Leg 3 Way Reach with Support  - 1 x daily - 7 x weekly - 1 sets - 10 reps - Standing Isometric Hip Abduction with Ball on Wall  - 1 x daily - 7 x weekly - 1 sets - 10 reps - Standing Hamstring Stretch with Step  - 1 x daily - 7 x weekly - 1 sets - 10 reps - Seated March with Ankle Weights at Foot  - 1 x daily - 7 x weekly - 3 sets - 10 reps - Quarter Squat with Table  - 1 x daily - 7 x weekly - 1 sets - 10 reps - Standing Hamstring Stretch with Step  - 1 x daily - 7 x weekly - 1 sets - 10 reps - Seated March with Ankle Weights at Foot  - 1 x daily - 7 x weekly - 3 sets -  10 reps - ASSESSMENT:   CLINICAL IMPRESSION:   Layce continues to demonstrate improved hip strength.  This is evident by her ability to do dynamic SLS activity.  Her only issue continues to be the lateral knee pain in the area of the distal IT band.  This is minor and tends to only occur when she is working to keep good alignment on her exercises.  This is understandable and expected with her valgus deformity on the right.   She would benefit from continued skilled PT for quad rehab and hip strengthening as well as alignment training.    OBJECTIVE IMPAIRMENTS Abnormal gait, decreased mobility, difficulty walking, decreased ROM, decreased strength, increased muscle spasms, impaired flexibility, and pain.    ACTIVITY LIMITATIONS cleaning, community activity, driving, meal prep, occupation, laundry, yard work, and shopping.    PERSONAL FACTORS Age, Fitness, and 1-2 comorbidities: anxiety, bradycardia  are also affecting patient's functional outcome.      REHAB POTENTIAL: Good   CLINICAL DECISION MAKING: Stable/uncomplicated   EVALUATION COMPLEXITY: Low     GOALS: Goals reviewed with patient? Yes   SHORT TERM GOALS: Target date: 02/25/2022  (Remove Blue Hyperlink)   Patient will be independent with initial HEP  Baseline: Goal status:  MET   2.  Pain report to be no greater than 4/10  Baseline:  Goal status: MET   3.  Patient to be able to demonstrate proper heel to toe progression Baseline:  Goal status: MET     LONG TERM GOALS: Target date: 05/16/2022   Patient to be independent with advanced HEP  Baseline:  Goal status: in Progress   2.  Patient to report pain no greater than 2/10  Baseline:  Goal status: In progress   3.  Patient to be able to ascend and descend steps safely without knee instability episodes Baseline:  Goal status: In progress   4.  Patient to be able to bend, stoop and squat with pain no greater than 2/10 in bilateral knees and thighs.  Baseline:  Goal status: In Progress   5.  Patient to be able to walk 1/2 mile without need to rest.  Baseline:  Goal status: In Progress       PLAN: PT FREQUENCY: 2x/week   PT DURATION: 8 weeks   PLANNED INTERVENTIONS: Therapeutic exercises, Therapeutic activity, Neuromuscular re-education, Balance training, Gait training, Patient/Family education, Joint mobilization, Aquatic Therapy, Dry Needling, Electrical stimulation, Cryotherapy, Moist heat, Taping, Vasopneumatic device, Ultrasound, Ionotophoresis 4mg /ml Dexamethasone, and Manual therapy   PLAN FOR NEXT SESSION:  quad strengthening; glute med strengthening; step ups/downs; leg press; hip machine; progressive loading; patient may try a Cho-Pat strap for comfort  Costantino Kohlbeck B. Zarion Oliff, PT 04/12/22 9:28 AM  [facility] [address], [place], [place] [address] Phone # [phone] Fax [phone]

## 2022-04-17 ENCOUNTER — Encounter: Payer: Self-pay | Admitting: Physical Therapy

## 2022-04-17 ENCOUNTER — Ambulatory Visit: Payer: Medicare (Managed Care) | Attending: Family Medicine | Admitting: Physical Therapy

## 2022-04-17 DIAGNOSIS — M25561 Pain in right knee: Secondary | ICD-10-CM | POA: Insufficient documentation

## 2022-04-17 DIAGNOSIS — M25562 Pain in left knee: Secondary | ICD-10-CM | POA: Insufficient documentation

## 2022-04-17 DIAGNOSIS — R262 Difficulty in walking, not elsewhere classified: Secondary | ICD-10-CM | POA: Insufficient documentation

## 2022-04-17 DIAGNOSIS — M6281 Muscle weakness (generalized): Secondary | ICD-10-CM | POA: Diagnosis present

## 2022-04-17 DIAGNOSIS — G8929 Other chronic pain: Secondary | ICD-10-CM | POA: Diagnosis present

## 2022-04-17 DIAGNOSIS — R252 Cramp and spasm: Secondary | ICD-10-CM | POA: Diagnosis present

## 2022-04-17 NOTE — Therapy (Signed)
OUTPATIENT PHYSICAL THERAPY TREATMENT NOTE/RECERTIFICATION   Patient Name: Mercedes Thompson MRN: [id number] DOB:[birth year], 76 y.o., female Today's Date: 04/17/2022   REFERRING PROVIDER: Nuala Alpha, MD  END OF SESSION:   PT End of Session - 04/17/22 1154     Visit Number 18    Date for PT Re-Evaluation 05/16/22    Authorization Type [employer] Medicare Advantage    Progress Note Due on Visit 32    PT Start Time 1150    PT Stop Time 1230    PT Time Calculation (min) 40 min    Activity Tolerance Patient tolerated treatment well               Past Medical History:  Diagnosis Date   AML (acute myeloblastic leukemia) ([place]) 1987   Anxiety    Appendicitis with peritoneal abscess 03/2015   [facility]/p [facility] lap [facility]   Atrial flutter [facility]) 2016   s/p cardioversion 02/2015, previously in [place], recently taken off amiodarone   Bradycardia 2015   s/p PPM insertion   H/O mitral valve replacement with mechanical valve    coumadin   Past Surgical History:  Procedure Laterality Date   APPENDECTOMY  03/24/2015   [facility] Dr Radene Knee.  perforated appendicitis   heart ablation     MITRAL VALVE REPLACEMENT  1986   PACEMAKER INSERTION     TUBAL LIGATION     Patient Active Problem List   Diagnosis Date Noted   Appendicitis with perforation s/p lap appy 03/22/2015 10/29/2015   Intra-abdominal abscess post-lap appy July 2016 10/29/2015   Intra-abdominal abscess ([place]) 10/29/2015   Abscess of abdominal cavity ([place]) 10/29/2015   Artificial cardiac pacemaker 05/17/2015   Sinoatrial node dysfunction ([place]) 05/17/2015   Long term current use of anticoagulant 03/22/2015   Encounter for therapeutic drug level monitoring 03/07/2015   Female genuine stress incontinence 03/25/2013   Anxiety disorder 08/14/2011   Dyslipidemia 08/13/2011   Atrial fibrillation ([place]) 06/11/2011   H/O mitral valve replacement with mechanical valve 06/11/2011    REFERRING DIAG: M79.651 (ICD-10-CM) - Pain in right thigh  M79.652 (ICD-10-CM) - Pain in left thigh      THERAPY DIAG: Chronic pain of right knee   Chronic pain of left knee   Difficulty in walking, not elsewhere classified   Muscle weakness (generalized)  PERTINENT HISTORY: Patient reports about 10 years ago she started having difficulty getting out of her low car and then covid hit and she became even more sedentary.  She feels she has gradually gotten weaker and the MD feels she may just be deconditioned.  She admits she did start a new statin drug about 3 to 4 months ago.  She describes her pain as muscle aching in the quads which worsens with activity and sit to stand. She hopes to gain strength and be able to get up and down from lower surfaces with increased ease.   Ankle pain seeing the doctor next week.   PRECAUTIONS: none  SUBJECTIVE: We did something last time lying down with a weight and I think that really helped and it didn't hurt. PAIN:  Are you having pain? Yes NPRS scale: 2-3/10 Pain location: right medial and lateral knee Aggravating factors: when I first stand up     OBJECTIVE: (objective measures completed at initial evaluation unless otherwise dated)  OBJECTIVE:    DIAGNOSTIC FINDINGS: none   PATIENT SURVEYS:  FOTO 44 (goal 57) 7/6:  63%   COGNITION:  Overall cognitive status: Within functional limits for tasks assessed                          SENSATION: WFL     POSTURE:  Adducted hips, poor alignment with mod valgus knees   PALPATION: Seated flexion and extension    LE ROM:  7/6:  Right and left knee flexion 129 degrees   (Blank rows = not tested)   LE MMT:  7/6:  hip abduction 4+/5    MMT Right 01/28/2022 Left 01/28/2022 7/6  Knee flexion WNL   5  Knee extension WN   4+   (Blank rows = not tested)   LOWER EXTREMITY SPECIAL TESTS:  Knee special tests: Apley's compression test: negative   FUNCTIONAL TESTS:  5 times sit to stand: 13.10 Timed up and go (TUG): 8.62   7/6: TUG:  7.67 sec 5x sit to stand 12.74   GAIT: Distance walked: 50 ft Assistive device utilized: None Level of assistance: Modified independence Comments: Antalgic, slight trendelenburg    TODAY'S TREATMENT:  8/2: NuStep level 1 x 5 min while discussing status Quad progression: Supine QS x 20, TKE over small green noodle x 20, TKE over blue foam roller x 20 with 5 lb ankle weight, SAQ x 20 with 5 lb, SLR x 10 with 5 lb, LAQ x 20 with 5 lb (all both sides) Seated up and over low cone x 20 with 5 lb ankle weights Seated hip ER with 5 lb ankle weights (bottom of foot to opposite ankle with hip abducted) both Leg Press x 20 bilateral with 70 lb, then singles x 20 with 30 lb Floor sliders 3 ways 10x each side Multi Hip: hip abd and ext 2 x 10 with 40 lb both Cone touches 15x both Neuromuscular re-ed to activate quads and glute medius Therapeutic activity: sit to stand, single leg balance, ascending and descending curbs and steps        04/12/22: NuStep level 3 x 5 min  Lateral band walks with blue loop x 5 laps of 10 steps  floor ( at barre) Standing hip ER in static stand with slightly flexed knees x 20 with blue loop Sit to stand 2 x 10 from chair Quad progression: Supine QS x 20, TKE over small green noodle x 20, TKE over blue foam roller x 20 with 5 lb ankle weight, SAQ x 20 with 5 lb, SLR x 20 with 5 lb, LAQ x 20 with 5 lb (all both sides) Seated march x 20 with 6 lb ankle weights Seated hip ER with 5 lb ankle weights (bottom of foot to opposite ankle with hip abducted) both Leg Press x 20 bilateral with 70 lb, then singles x 20 with 40 lb Multi Hip: hip abd and ext 2 x 10 with 40 lb both Cone touches 3 x 10 both SLS ball toss against wall attempted 10 bounces per LE x 3 each   04/10/22: NuStep level 3 x 5 min  Seated LAQ x 20 with 6 lb (did not have to use ball for adduction) No pain Seated march x 20 with 6 lb ankle weights Seated hip ER with 5 lb ankle weights (bottom of foot to  opposite ankle with hip abducted) Sit to stand x 10 from mat table with 10 lb kb Squat to table x 10 with 10 lb kb (patient had a difficult time with squat to table- modified to add balance pad  under) Lateral band walks with blue loop x 5 laps of 10 steps  floor ( at barre) Seated clam x 20 with blue loop Leg Press x 20 bilateral with 70 lb, then singles x 20 with 40 lb Multi Hip: hip abd and ext 2 x 10 with 40 lb both Step up and hold on balance pad x 10 ea LE fwd Step up onto 4" step x 10 each ( patient had 2 episodes of pain with knee instability during this with right knee) Ice to right knee x 10 min    PATIENT EDUCATION:  Access Code: [email or web address] URL: [email or web address] Date: 03/07/2022 Prepared by: Ruben Im  Exercises - Seated Long Arc Quad  - 1 x daily - 7 x weekly - 3 sets - 10 reps - Seated Hip Abduction  - 1 x daily - 7 x weekly - 3 sets - 10 reps - Sit to Stand Without Arm Support  - 1 x daily - 7 x weekly - 1 sets - 10 reps - Side Stepping with Resistance at Thighs and Counter Support  - 1 x daily - 7 x weekly - 1 sets - 10 reps - Forward and Backward Step Over with Beam (BKA)  - 1 x daily - 7 x weekly - 1 sets - 10 reps - Supine ITB Stretch with Strap  - 1 x daily - 7 x weekly - 1 sets - 3 reps - 30 sec hold - Seated Hip Abduction with Resistance  - 1 x daily - 7 x weekly - 2 sets - 10 reps - Seated Isometric Hip Adduction with Ball  - 1 x daily - 7 x weekly - 2 sets - 10 reps - Supine Figure 4 Piriformis Stretch  - 1 x daily - 7 x weekly - 1 sets - 3 reps - 30 hold - Clamshell  - 1 x daily - 7 x weekly - 1 sets - 10 reps - Forward Step Up  - 1 x daily - 7 x weekly - 1 sets - 10 reps - Forward Step Down  - 1 x daily - 7 x weekly - 1 sets - 10 reps - Step Taps on High Step  - 1 x daily - 7 x weekly - 1 sets - 10 reps - Single Leg 3 Way Reach with Support  - 1 x daily - 7 x weekly - 1 sets - 10 reps - Standing Isometric Hip Abduction with Ball on Wall  -  1 x daily - 7 x weekly - 1 sets - 10 reps - Standing Hamstring Stretch with Step  - 1 x daily - 7 x weekly - 1 sets - 10 reps - Seated March with Ankle Weights at Foot  - 1 x daily - 7 x weekly - 3 sets - 10 reps - Quarter Squat with Table  - 1 x daily - 7 x weekly - 1 sets - 10 reps - Standing Hamstring Stretch with Step  - 1 x daily - 7 x weekly - 1 sets - 10 reps - Seated March with Ankle Weights at Foot  - 1 x daily - 7 x weekly - 3 sets - 10 reps - ASSESSMENT:   CLINICAL IMPRESSION:   The patient reports less pain following open chain quad strengthening with 5# weight.  Single leg balance is quite difficult.  Pain level remains fairly low with closed chain ex's today.  Therapist monitoring response throughout session and modifying as appropriate.  OBJECTIVE IMPAIRMENTS Abnormal gait, decreased mobility, difficulty walking, decreased ROM, decreased strength, increased muscle spasms, impaired flexibility, and pain.    ACTIVITY LIMITATIONS cleaning, community activity, driving, meal prep, occupation, laundry, yard work, and shopping.    PERSONAL FACTORS Age, Fitness, and 1-2 comorbidities: anxiety, bradycardia  are also affecting patient's functional outcome.      REHAB POTENTIAL: Good   CLINICAL DECISION MAKING: Stable/uncomplicated   EVALUATION COMPLEXITY: Low     GOALS: Goals reviewed with patient? Yes   SHORT TERM GOALS: Target date: 02/25/2022  (Remove Blue Hyperlink)   Patient will be independent with initial HEP  Baseline: Goal status: MET   2.  Pain report to be no greater than 4/10  Baseline:  Goal status: MET   3.  Patient to be able to demonstrate proper heel to toe progression Baseline:  Goal status: MET     LONG TERM GOALS: Target date: 05/16/2022   Patient to be independent with advanced HEP  Baseline:  Goal status: in Progress   2.  Patient to report pain no greater than 2/10  Baseline:  Goal status: In progress   3.  Patient to be able to ascend  and descend steps safely without knee instability episodes Baseline:  Goal status: In progress   4.  Patient to be able to bend, stoop and squat with pain no greater than 2/10 in bilateral knees and thighs.  Baseline:  Goal status: In Progress   5.  Patient to be able to walk 1/2 mile without need to rest.  Baseline:  Goal status: In Progress       PLAN: PT FREQUENCY: 2x/week   PT DURATION: 8 weeks   PLANNED INTERVENTIONS: Therapeutic exercises, Therapeutic activity, Neuromuscular re-education, Balance training, Gait training, Patient/Family education, Joint mobilization, Aquatic Therapy, Dry Needling, Electrical stimulation, Cryotherapy, Moist heat, Taping, Vasopneumatic device, Ultrasound, Ionotophoresis 51m/ml Dexamethasone, and Manual therapy   PLAN FOR NEXT SESSION:  open chain quad strengthening; glute med strengthening; step ups/downs; leg press; hip machine; progressive loading; [facility]  SPoint [place] PV[place]08/02/23 12:29 PM Phone: [phone]: [phone]

## 2022-04-18 DIAGNOSIS — Z7901 Long term (current) use of anticoagulants: Secondary | ICD-10-CM | POA: Diagnosis not present

## 2022-04-19 ENCOUNTER — Ambulatory Visit: Payer: Medicare (Managed Care) | Admitting: Physical Therapy

## 2022-04-23 ENCOUNTER — Ambulatory Visit: Payer: Medicare (Managed Care) | Admitting: Physical Therapy

## 2022-04-23 DIAGNOSIS — G8929 Other chronic pain: Secondary | ICD-10-CM

## 2022-04-23 DIAGNOSIS — M6281 Muscle weakness (generalized): Secondary | ICD-10-CM

## 2022-04-23 DIAGNOSIS — M25561 Pain in right knee: Secondary | ICD-10-CM | POA: Diagnosis not present

## 2022-04-23 DIAGNOSIS — R262 Difficulty in walking, not elsewhere classified: Secondary | ICD-10-CM

## 2022-04-23 NOTE — Therapy (Signed)
OUTPATIENT PHYSICAL THERAPY TREATMENT NOTE   Patient Name: Mercedes Thompson MRN: [id number] DOB:[birth year], 76 y.o., female Today's Date: 04/23/2022   REFERRING PROVIDER: Nuala Alpha, MD  END OF SESSION:   PT End of Session - 04/23/22 1530     Visit Number 19    Date for PT Re-Evaluation 05/16/22    Authorization Type [employer]age    Progress Note Due on Visit 20    PT Start Time 1530    PT Stop Time 1610    PT Time Calculation (min) 40 min    Activity Tolerance Patient tolerated treatment well               Past Medical History:  Diagnosis Date   AML (acute myeloblastic leukemia) ([place]) 1987   Anxiety    Appendicitis with peritoneal abscess 03/2015   s/p ex lap at [facility]   Atrial flutter [facility]) 2016   s/p cardioversion 02/2015, previously in [place], recently taken off amiodarone   Bradycardia 2015   s/p PPM insertion   H/O mitral valve replacement with mechanical valve    coumadin   Past Surgical History:  Procedure Laterality Date   APPENDECTOMY  03/24/2015   [facility] Dr Radene Knee.  perforated appendicitis   heart ablation     MITRAL VALVE REPLACEMENT  1986   PACEMAKER INSERTION     TUBAL LIGATION     Patient Active Problem List   Diagnosis Date Noted   Appendicitis with perforation s/p lap appy 03/22/2015 10/29/2015   Intra-abdominal abscess post-lap appy July 2016 10/29/2015   Intra-abdominal abscess ([place]) 10/29/2015   Abscess of abdominal cavity ([place]) 10/29/2015   Artificial cardiac pacemaker 05/17/2015   Sinoatrial node dysfunction ([place]) 05/17/2015   Long term current use of anticoagulant 03/22/2015   Encounter for therapeutic drug level monitoring 03/07/2015   Female genuine stress incontinence 03/25/2013   Anxiety disorder 08/14/2011   Dyslipidemia 08/13/2011   Atrial fibrillation ([place]) 06/11/2011   H/O mitral valve replacement with mechanical valve 06/11/2011    REFERRING DIAG: M79.651 (ICD-10-CM) - Pain in right thigh M79.652 (ICD-10-CM)  - Pain in left thigh      THERAPY DIAG: Chronic pain of right knee   Chronic pain of left knee   Difficulty in walking, not elsewhere classified   Muscle weakness (generalized)  PERTINENT HISTORY: Patient reports about 10 years ago she started having difficulty getting out of her low car and then covid hit and she became even more sedentary.  She feels she has gradually gotten weaker and the MD feels she may just be deconditioned.  She admits she did start a new statin drug about 3 to 4 months ago.  She describes her pain as muscle aching in the quads which worsens with activity and sit to stand. She hopes to gain strength and be able to get up and down from lower surfaces with increased ease.   Ankle pain seeing the doctor next week.   PRECAUTIONS: none  SUBJECTIVE: Went tubing over the weekend.  Did fine getting in the tube, hard getting out of the tube to steep steps.    PAIN:  Are you having pain? Yes NPRS scale: 2-3/10 Pain location: right medial and lateral knee Aggravating factors: when I first stand up     OBJECTIVE: (objective measures completed at initial evaluation unless otherwise dated)  OBJECTIVE:    DIAGNOSTIC FINDINGS: none   PATIENT SURVEYS:  FOTO 44 (goal 57) 7/6:  63%   COGNITION:  Overall cognitive status: Within functional limits for tasks assessed                          SENSATION: WFL     POSTURE:  Adducted hips, poor alignment with mod valgus knees   PALPATION: Seated flexion and extension    LE ROM:  7/6:  Right and left knee flexion 129 degrees   (Blank rows = not tested)   LE MMT:  7/6:  hip abduction 4+/5    MMT Right 01/28/2022 Left 01/28/2022 7/6 8/8  Knee flexion WNL   5 5  Knee extension WN   4+ 4+   (Blank rows = not tested)   LOWER EXTREMITY SPECIAL TESTS:  Knee special tests: Apley's compression test: negative   FUNCTIONAL TESTS:  5 times sit to stand: 13.10 Timed up and go (TUG): 8.62   7/6: TUG:  7.67 sec 5x sit to stand 12.74   GAIT: Distance walked: 50 ft Assistive device utilized: None Level of assistance: Modified independence Comments: Antalgic, slight trendelenburg    TODAY'S TREATMENT:  8/8: NuStep level 6 (new model) x 5 min while discussing status Quad progression: Supine QS x 20, TKE over small green noodle x 20, TKE over blue foam roller x 20 with 5 lb ankle weight, SLR x 10 with 5 lb, LAQ x 20 with 5 lb (all both sides) Seated up and over low cone x 20 with 5 lb ankle weights Seated hip ER with 5 lb ankle weights (bottom of foot to opposite ankle with hip abducted) both Leg Press seat 7 x 20 bilateral with 70 lb, then singles x 20 with 35 lb heel raises and lowers with weight shifts 5x each way Floor sliders 3 ways 10x each side; arcs 10x each way Multi Hip: hip abd and ext 2 x 10 with 40 lb both  Neuromuscular re-ed to activate quads and glute medius Therapeutic activity: sit to stand, single leg balance, ascending and descending curbs and steps          8/2: NuStep level 1 x 5 min while discussing status Quad progression: Supine QS x 20, TKE over small green noodle x 20, TKE over blue foam roller x 20 with 5 lb ankle weight, SAQ x 20 with 5 lb, SLR x 10 with 5 lb, LAQ x 20 with 5 lb (all both sides) Seated up and over low cone x 20 with 5 lb ankle weights Seated hip ER with 5 lb ankle weights (bottom of foot to opposite ankle with hip abducted) both Leg Press x 20 bilateral with 70 lb, then singles x 20 with 30 lb Floor sliders 3 ways 10x each side Multi Hip: hip abd and ext 2 x 10 with 40 lb both Cone touches 15x both Neuromuscular re-ed to activate quads and glute medius Therapeutic activity: sit to stand, single leg balance, ascending and descending curbs and steps    PATIENT EDUCATION:  Access Code: [email or web address] URL: [email or web address] Date: 03/07/2022 Prepared by: Ruben Im  Exercises - Seated Long Arc Quad  - 1 x daily -  7 x weekly - 3 sets - 10 reps - Seated Hip Abduction  - 1 x daily - 7 x weekly - 3 sets - 10 reps - Sit to Stand Without Arm Support  - 1 x daily - 7 x weekly - 1 sets - 10 reps - Side Stepping with Resistance at Thighs and Counter Support  - 1 x  daily - 7 x weekly - 1 sets - 10 reps - Forward and Backward Step Over with Beam (BKA)  - 1 x daily - 7 x weekly - 1 sets - 10 reps - Supine ITB Stretch with Strap  - 1 x daily - 7 x weekly - 1 sets - 3 reps - 30 sec hold - Seated Hip Abduction with Resistance  - 1 x daily - 7 x weekly - 2 sets - 10 reps - Seated Isometric Hip Adduction with Ball  - 1 x daily - 7 x weekly - 2 sets - 10 reps - Supine Figure 4 Piriformis Stretch  - 1 x daily - 7 x weekly - 1 sets - 3 reps - 30 hold - Clamshell  - 1 x daily - 7 x weekly - 1 sets - 10 reps - Forward Step Up  - 1 x daily - 7 x weekly - 1 sets - 10 reps - Forward Step Down  - 1 x daily - 7 x weekly - 1 sets - 10 reps - Step Taps on High Step  - 1 x daily - 7 x weekly - 1 sets - 10 reps - Single Leg 3 Way Reach with Support  - 1 x daily - 7 x weekly - 1 sets - 10 reps - Standing Isometric Hip Abduction with Ball on Wall  - 1 x daily - 7 x weekly - 1 sets - 10 reps - Standing Hamstring Stretch with Step  - 1 x daily - 7 x weekly - 1 sets - 10 reps - Seated March with Ankle Weights at Foot  - 1 x daily - 7 x weekly - 3 sets - 10 reps - Quarter Squat with Table  - 1 x daily - 7 x weekly - 1 sets - 10 reps - Standing Hamstring Stretch with Step  - 1 x daily - 7 x weekly - 1 sets - 10 reps - Seated March with Ankle Weights at Foot  - 1 x daily - 7 x weekly - 3 sets - 10 reps - ASSESSMENT:   CLINICAL IMPRESSION:   Likes the quad progression supine and seated.  No pain with LAQ but SLR quite challenging.  Able to increase weight on single leg press.  Discomfort on right single leg press with straight alignment but less pain with inward positioning.  Therapist monitoring response and providing verbal cues for  optimal muscle activation.     OBJECTIVE IMPAIRMENTS Abnormal gait, decreased mobility, difficulty walking, decreased ROM, decreased strength, increased muscle spasms, impaired flexibility, and pain.    ACTIVITY LIMITATIONS cleaning, community activity, driving, meal prep, occupation, laundry, yard work, and shopping.    PERSONAL FACTORS Age, Fitness, and 1-2 comorbidities: anxiety, bradycardia  are also affecting patient's functional outcome.      REHAB POTENTIAL: Good   CLINICAL DECISION MAKING: Stable/uncomplicated   EVALUATION COMPLEXITY: Low     GOALS: Goals reviewed with patient? Yes   SHORT TERM GOALS: Target date: 02/25/2022  (Remove Blue Hyperlink)   Patient will be independent with initial HEP  Baseline: Goal status: MET   2.  Pain report to be no greater than 4/10  Baseline:  Goal status: MET   3.  Patient to be able to demonstrate proper heel to toe progression Baseline:  Goal status: MET     LONG TERM GOALS: Target date: 05/16/2022   Patient to be independent with advanced HEP  Baseline:  Goal status: in Progress  2.  Patient to report pain no greater than 2/10  Baseline:  Goal status: In progress   3.  Patient to be able to ascend and descend steps safely without knee instability episodes Baseline:  Goal status: In progress   4.  Patient to be able to bend, stoop and squat with pain no greater than 2/10 in bilateral knees and thighs.  Baseline:  Goal status: In Progress   5.  Patient to be able to walk 1/2 mile without need to rest.  Baseline:  Goal status: In Progress       PLAN: PT FREQUENCY: 2x/week   PT DURATION: 8 weeks   PLANNED INTERVENTIONS: Therapeutic exercises, Therapeutic activity, Neuromuscular re-education, Balance training, Gait training, Patient/Family education, Joint mobilization, Aquatic Therapy, Dry Needling, Electrical stimulation, Cryotherapy, Moist heat, Taping, Vasopneumatic device, Ultrasound, Ionotophoresis 63m/ml  Dexamethasone, and Manual therapy   PLAN FOR NEXT SESSION:  20th visit progress note;  take objective measures; FOTO; open chain quad strengthening; glute med strengthening; step ups/downs; leg press; hip machine; progressive loading; going to the beach next week  SRuben Im PT 04/23/22 5:09 PM Phone: [phone]: [phone]

## 2022-04-25 ENCOUNTER — Ambulatory Visit: Payer: Medicare (Managed Care)

## 2022-04-25 DIAGNOSIS — M25561 Pain in right knee: Secondary | ICD-10-CM | POA: Diagnosis not present

## 2022-04-25 DIAGNOSIS — R252 Cramp and spasm: Secondary | ICD-10-CM

## 2022-04-25 DIAGNOSIS — G8929 Other chronic pain: Secondary | ICD-10-CM

## 2022-04-25 DIAGNOSIS — M6281 Muscle weakness (generalized): Secondary | ICD-10-CM

## 2022-04-25 DIAGNOSIS — R262 Difficulty in walking, not elsewhere classified: Secondary | ICD-10-CM

## 2022-04-25 NOTE — Therapy (Signed)
OUTPATIENT PHYSICAL THERAPY TREATMENT NOTE   Patient Name: Mercedes Thompson MRN: [id number] DOB:May 17, 1946, 76 y.o., female Today's Date: 04/25/2022   REFERRING PROVIDER: Nuala Alpha, MD  END OF SESSION:   PT End of Session - 04/25/22 1119     Visit Number 20    Date for PT Re-Evaluation 05/16/22    Authorization Type [employer] Advantage    Progress Note Due on Visit 62    PT Start Time 1100    PT Stop Time 1145    PT Time Calculation (min) 45 min    Activity Tolerance Patient tolerated treatment well    Behavior During Therapy WFL for tasks assessed/performed               Past Medical History:  Diagnosis Date   AML (acute myeloblastic leukemia) ([place]) 1987   Anxiety    Appendicitis with peritoneal abscess 03/2015   s/p ex lap at [facility]   Atrial flutter [facility]) 2016   s/p cardioversion 02/2015, previously in [place], recently taken off amiodarone   Bradycardia 2015   s/p PPM insertion   H/O mitral valve replacement with mechanical valve    coumadin   Past Surgical History:  Procedure Laterality Date   APPENDECTOMY  03/24/2015   [facility] Dr Radene Knee.  perforated appendicitis   heart ablation     MITRAL VALVE REPLACEMENT  1986   PACEMAKER INSERTION     TUBAL LIGATION     Patient Active Problem List   Diagnosis Date Noted   Appendicitis with perforation s/p lap appy 03/22/2015 10/29/2015   Intra-abdominal abscess post-lap appy July 2016 10/29/2015   Intra-abdominal abscess ([place]) 10/29/2015   Abscess of abdominal cavity ([place]) 10/29/2015   Artificial cardiac pacemaker 05/17/2015   Sinoatrial node dysfunction ([place]) 05/17/2015   Long term current use of anticoagulant 03/22/2015   Encounter for therapeutic drug level monitoring 03/07/2015   Female genuine stress incontinence 03/25/2013   Anxiety disorder 08/14/2011   Dyslipidemia 08/13/2011   Atrial fibrillation ([place]) 06/11/2011   H/O mitral valve replacement with mechanical valve 06/11/2011    REFERRING DIAG:  M79.651 (ICD-10-CM) - Pain in right thigh M79.652 (ICD-10-CM) - Pain in left thigh      THERAPY DIAG: Chronic pain of right knee   Chronic pain of left knee   Difficulty in walking, not elsewhere classified   Muscle weakness (generalized)  PERTINENT HISTORY: Patient reports about 10 years ago she started having difficulty getting out of her low car and then covid hit and she became even more sedentary.  She feels she has gradually gotten weaker and the MD feels she may just be deconditioned.  She admits she did start a new statin drug about 3 to 4 months ago.  She describes her pain as muscle aching in the quads which worsens with activity and sit to stand. She hopes to gain strength and be able to get up and down from lower surfaces with increased ease.   Ankle pain seeing the doctor next week.   PRECAUTIONS: none  SUBJECTIVE: Patient states she continues to have the lateral knee pain on right.      PAIN:  Are you having pain? Yes NPRS scale: 2-3/10 Pain location: right medial and lateral knee Aggravating factors: when I first stand up     OBJECTIVE: (objective measures completed at initial evaluation unless otherwise dated)  OBJECTIVE:    DIAGNOSTIC FINDINGS: none   PATIENT SURVEYS:  FOTO 44 (goal 57) 03/21/22:  63% 04/25/22:  71%  COGNITION:           Overall cognitive status: Within functional limits for tasks assessed                          SENSATION: WFL     POSTURE:  Adducted hips, poor alignment with mod valgus knees   PALPATION: Seated flexion and extension    LE ROM:  7/6:  Right and left knee flexion 129 degrees   (Blank rows = not tested)   LE MMT:  7/6:  hip abduction 4+/5    MMT Right 01/28/2022 Left 01/28/2022 7/6 8/8  Knee flexion WNL   5 5  Knee extension WN   4+ 4+   (Blank rows = not tested)   LOWER EXTREMITY SPECIAL TESTS:  Knee special tests: Apley's compression test: negative   FUNCTIONAL TESTS:  5 times sit to stand: 13.10   (04/25/22 10.29 sec) Timed up and go (TUG): (04/25/22 7.90 sec)  03/21/22: TUG: 7.67 sec 5x sit to stand 12.74  04/25/22: TUG: 7.90 sec 5x sit to stand: 10.29 sec   GAIT: Distance walked: 50 ft Assistive device utilized: None Level of assistance: Modified independence Comments: Antalgic, slight trendelenburg    TODAY'S TREATMENT:  8/10: NuStep level 6 (new model) x 5 min while discussing status 20 visit re-assessment  Sit to stand x 10 Squat to chair x 10 Seated up and over low cone x 20 with 5 lb ankle weights Seated hip ER with 5 lb ankle weights (bottom of foot to opposite ankle with hip abducted) both Leg Press seat 7 x 20 bilateral with 70 lb, then singles x 20 with 40 lb band walks with yellow loop x 3 laps Therapeutic activity: sit to stand, single leg balance, ascending and descending curbs and steps Cupping to lateral knee in area of distal IT band and around to patellar tendon area. 8/8: NuStep level 6 (new model) x 5 min while discussing status Quad progression: Supine QS x 20, TKE over small green noodle x 20, TKE over blue foam roller x 20 with 5 lb ankle weight, SLR x 10 with 5 lb, LAQ x 20 with 5 lb (all both sides) Seated up and over low cone x 20 with 5 lb ankle weights Seated hip ER with 5 lb ankle weights (bottom of foot to opposite ankle with hip abducted) both Leg Press seat 7 x 20 bilateral with 70 lb, then singles x 20 with 35 lb heel raises and lowers with weight shifts 5x each way Floor sliders 3 ways 10x each side; arcs 10x each way Multi Hip: hip abd and ext 2 x 10 with 40 lb both  Neuromuscular re-ed to activate quads and glute medius Therapeutic activity: sit to stand, single leg balance, ascending and descending curbs and steps  8/2: NuStep level 1 x 5 min while discussing status Quad progression: Supine QS x 20, TKE over small green noodle x 20, TKE over blue foam roller x 20 with 5 lb ankle weight, SAQ x 20 with 5 lb, SLR x 10 with 5 lb, LAQ x  20 with 5 lb (all both sides) Seated up and over low cone x 20 with 5 lb ankle weights Seated hip ER with 5 lb ankle weights (bottom of foot to opposite ankle with hip abducted) both Leg Press x 20 bilateral with 70 lb, then singles x 20 with 30 lb Floor sliders 3 ways 10x each side Multi Hip:  hip abd and ext 2 x 10 with 40 lb both Cone touches 15x both Neuromuscular re-ed to activate quads and glute medius Therapeutic activity: sit to stand, single leg balance, ascending and descending curbs and steps    PATIENT EDUCATION:  Access Code: [email or web address] URL: [email or web address] Date: 03/07/2022 Prepared by: Ruben Im  Exercises - Seated Long Arc Quad  - 1 x daily - 7 x weekly - 3 sets - 10 reps - Seated Hip Abduction  - 1 x daily - 7 x weekly - 3 sets - 10 reps - Sit to Stand Without Arm Support  - 1 x daily - 7 x weekly - 1 sets - 10 reps - Side Stepping with Resistance at Thighs and Counter Support  - 1 x daily - 7 x weekly - 1 sets - 10 reps - Forward and Backward Step Over with Beam (BKA)  - 1 x daily - 7 x weekly - 1 sets - 10 reps - Supine ITB Stretch with Strap  - 1 x daily - 7 x weekly - 1 sets - 3 reps - 30 sec hold - Seated Hip Abduction with Resistance  - 1 x daily - 7 x weekly - 2 sets - 10 reps - Seated Isometric Hip Adduction with Ball  - 1 x daily - 7 x weekly - 2 sets - 10 reps - Supine Figure 4 Piriformis Stretch  - 1 x daily - 7 x weekly - 1 sets - 3 reps - 30 hold - Clamshell  - 1 x daily - 7 x weekly - 1 sets - 10 reps - Forward Step Up  - 1 x daily - 7 x weekly - 1 sets - 10 reps - Forward Step Down  - 1 x daily - 7 x weekly - 1 sets - 10 reps - Step Taps on High Step  - 1 x daily - 7 x weekly - 1 sets - 10 reps - Single Leg 3 Way Reach with Support  - 1 x daily - 7 x weekly - 1 sets - 10 reps - Standing Isometric Hip Abduction with Ball on Wall  - 1 x daily - 7 x weekly - 1 sets - 10 reps - Standing Hamstring Stretch with Step  - 1 x daily - 7 x  weekly - 1 sets - 10 reps - Seated March with Ankle Weights at Foot  - 1 x daily - 7 x weekly - 3 sets - 10 reps - Quarter Squat with Table  - 1 x daily - 7 x weekly - 1 sets - 10 reps - Standing Hamstring Stretch with Step  - 1 x daily - 7 x weekly - 1 sets - 10 reps - Seated March with Ankle Weights at Foot  - 1 x daily - 7 x weekly - 3 sets - 10 reps - ASSESSMENT:   CLINICAL IMPRESSION:   Evey shows continued progress on re-assessment.  She responded well to cupping to alleviate distal IT band irritation.     OBJECTIVE IMPAIRMENTS Abnormal gait, decreased mobility, difficulty walking, decreased ROM, decreased strength, increased muscle spasms, impaired flexibility, and pain.    ACTIVITY LIMITATIONS cleaning, community activity, driving, meal prep, occupation, laundry, yard work, and shopping.    PERSONAL FACTORS Age, Fitness, and 1-2 comorbidities: anxiety, bradycardia  are also affecting patient's functional outcome.      REHAB POTENTIAL: Good   CLINICAL DECISION MAKING: Stable/uncomplicated   EVALUATION COMPLEXITY: Low     GOALS: Goals  reviewed with patient? Yes   SHORT TERM GOALS: Target date: 02/25/2022  (Remove Blue Hyperlink)   Patient will be independent with initial HEP  Baseline: Goal status: MET   2.  Pain report to be no greater than 4/10  Baseline:  Goal status: MET   3.  Patient to be able to demonstrate proper heel to toe progression Baseline:  Goal status: MET     LONG TERM GOALS: Target date: 05/16/2022   Patient to be independent with advanced HEP  Baseline:  Goal status: in Progress   2.  Patient to report pain no greater than 2/10  Baseline:  Goal status: In progress   3.  Patient to be able to ascend and descend steps safely without knee instability episodes Baseline:  Goal status: In progress   4.  Patient to be able to bend, stoop and squat with pain no greater than 2/10 in bilateral knees and thighs.  Baseline:  Goal status: In  Progress   5.  Patient to be able to walk 1/2 mile without need to rest.  Baseline:  Goal status: In Progress       PLAN: PT FREQUENCY: 2x/week   PT DURATION: 8 weeks   PLANNED INTERVENTIONS: Therapeutic exercises, Therapeutic activity, Neuromuscular re-education, Balance training, Gait training, Patient/Family education, Joint mobilization, Aquatic Therapy, Dry Needling, Electrical stimulation, Cryotherapy, Moist heat, Taping, Vasopneumatic device, Ultrasound, Ionotophoresis 68m/ml Dexamethasone, and Manual therapy   PLAN FOR NEXT SESSION:  assess response to cupping, open chain quad strengthening; glute med strengthening; step ups/downs; leg press; hip machine; progressive loading; going to the beach next week  JAnderson MaltaB. Kolleen Ochsner, PT 04/25/22 2:04 PM  Phone: [phone]: [phone]

## 2022-05-06 DIAGNOSIS — Z1331 Encounter for screening for depression: Secondary | ICD-10-CM | POA: Diagnosis not present

## 2022-05-06 DIAGNOSIS — I4891 Unspecified atrial fibrillation: Secondary | ICD-10-CM | POA: Diagnosis not present

## 2022-05-06 DIAGNOSIS — E785 Hyperlipidemia, unspecified: Secondary | ICD-10-CM | POA: Diagnosis not present

## 2022-05-06 DIAGNOSIS — G4733 Obstructive sleep apnea (adult) (pediatric): Secondary | ICD-10-CM | POA: Diagnosis not present

## 2022-05-07 ENCOUNTER — Ambulatory Visit: Payer: Medicare (Managed Care)

## 2022-05-07 DIAGNOSIS — M25561 Pain in right knee: Secondary | ICD-10-CM | POA: Diagnosis not present

## 2022-05-07 DIAGNOSIS — M6281 Muscle weakness (generalized): Secondary | ICD-10-CM

## 2022-05-07 DIAGNOSIS — R262 Difficulty in walking, not elsewhere classified: Secondary | ICD-10-CM

## 2022-05-07 DIAGNOSIS — R252 Cramp and spasm: Secondary | ICD-10-CM

## 2022-05-07 DIAGNOSIS — G8929 Other chronic pain: Secondary | ICD-10-CM

## 2022-05-07 NOTE — Therapy (Signed)
OUTPATIENT PHYSICAL THERAPY TREATMENT NOTE   Patient Name: Mercedes Thompson MRN: [id number] DOB:[birth year], 76 y.o., female Today's Date: 05/07/2022   REFERRING PROVIDER: Nuala Alpha, MD  END OF SESSION:   PT End of Session - 05/07/22 1108     Visit Number 21    Date for PT Re-Evaluation 05/16/22    Authorization Type [employer]vantage    PT Start Time 1104    PT Stop Time 3825    PT Time Calculation (min) 41 min    Activity Tolerance Patient tolerated treatment well    Behavior During Therapy WFL for tasks assessed/performed               Past Medical History:  Diagnosis Date   AML (acute myeloblastic leukemia) ([place]) 1987   Anxiety    Appendicitis with peritoneal abscess 03/2015   s/p ex lap at [facility]   Atrial flutter [facility]) 2016   s/p cardioversion 02/2015, previously in [place], recently taken off amiodarone   Bradycardia 2015   s/p PPM insertion   H/O mitral valve replacement with mechanical valve    coumadin   Past Surgical History:  Procedure Laterality Date   APPENDECTOMY  03/24/2015   [facility] Dr Radene Knee.  perforated appendicitis   heart ablation     MITRAL VALVE REPLACEMENT  1986   PACEMAKER INSERTION     TUBAL LIGATION     Patient Active Problem List   Diagnosis Date Noted   Appendicitis with perforation s/p lap appy 03/22/2015 10/29/2015   Intra-abdominal abscess post-lap appy July 2016 10/29/2015   Intra-abdominal abscess ([place]) 10/29/2015   Abscess of abdominal cavity ([place]) 10/29/2015   Artificial cardiac pacemaker 05/17/2015   Sinoatrial node dysfunction ([place]) 05/17/2015   Long term current use of anticoagulant 03/22/2015   Encounter for therapeutic drug level monitoring 03/07/2015   Female genuine stress incontinence 03/25/2013   Anxiety disorder 08/14/2011   Dyslipidemia 08/13/2011   Atrial fibrillation ([place]) 06/11/2011   H/O mitral valve replacement with mechanical valve 06/11/2011    REFERRING DIAG: M79.651 (ICD-10-CM) - Pain in  right thigh M79.652 (ICD-10-CM) - Pain in left thigh      THERAPY DIAG: Chronic pain of right knee   Chronic pain of left knee   Difficulty in walking, not elsewhere classified   Muscle weakness (generalized)  PERTINENT HISTORY: Patient reports about 10 years ago she started having difficulty getting out of her low car and then covid hit and she became even more sedentary.  She feels she has gradually gotten weaker and the MD feels she may just be deconditioned.  She admits she did start a new statin drug about 3 to 4 months ago.  She describes her pain as muscle aching in the quads which worsens with activity and sit to stand. She hopes to gain strength and be able to get up and down from lower surfaces with increased ease.   Ankle pain seeing the doctor next week.   PRECAUTIONS: none  SUBJECTIVE: Patient states she is doing well.  No significant pain today and did well on her trip to the beach.      PAIN:  Are you having pain? Yes NPRS scale: 2-3/10 Pain location: right medial and lateral knee Aggravating factors: when I first stand up     OBJECTIVE: (objective measures completed at initial evaluation unless otherwise dated)  OBJECTIVE:    DIAGNOSTIC FINDINGS: none   PATIENT SURVEYS:  FOTO 44 (goal 57) 03/21/22:  63% 04/25/22:  71%  COGNITION:           Overall cognitive status: Within functional limits for tasks assessed                          SENSATION: WFL     POSTURE:  Adducted hips, poor alignment with mod valgus knees   PALPATION: Seated flexion and extension    LE ROM:  7/6:  Right and left knee flexion 129 degrees   (Blank rows = not tested)   LE MMT:  7/6:  hip abduction 4+/5    MMT Right 01/28/2022 Left 01/28/2022 7/6 8/8  Knee flexion WNL   5 5  Knee extension WN   4+ 4+   (Blank rows = not tested)   LOWER EXTREMITY SPECIAL TESTS:  Knee special tests: Apley's compression test: negative   FUNCTIONAL TESTS:  5 times sit to stand: 13.10   (04/25/22 10.29 sec) Timed up and go (TUG): (04/25/22 7.90 sec)  03/21/22: TUG: 7.67 sec 5x sit to stand 12.74  04/25/22: TUG: 7.90 sec 5x sit to stand: 10.29 sec   GAIT: Distance walked: 50 ft Assistive device utilized: None Level of assistance: Modified independence Comments: Antalgic, slight trendelenburg    TODAY'S TREATMENT:  8/22: NuStep level 6 (new model) x 5 min while discussing status Leg Press seat 7 x 20 bilateral with 70 lb, then singles x 20 with 40 lb  Sit to stand x 10 with 10 lb kb Squat to chair x 10 with 10 lb kb Seated LAQ with 6 lb 2 x 10 both Seated up and over hurdle x 20 with 6 lb ankle weights Seated hip ER with 6 lb ankle weights (bottom of foot to opposite ankle with hip abducted) both band walks with yellow loop x 3 laps Seated clam with yellow loop x 20 Standing hip ER with yellow loop x 20 Step up 6" 2 x 10 with left UE support 2" step down (heel tap only) Cupping to lateral knee in area of distal IT band and around to patellar tendon area  8/10: NuStep level 6 (new model) x 5 min while discussing status 20 visit re-assessment  Sit to stand x 10 Squat to chair x 10 Seated up and over low cone x 20 with 5 lb ankle weights Seated hip ER with 5 lb ankle weights (bottom of foot to opposite ankle with hip abducted) both Leg Press seat 7 x 20 bilateral with 70 lb, then singles x 20 with 40 lb band walks with yellow loop x 3 laps Therapeutic activity: sit to stand, single leg balance, ascending and descending curbs and steps Cupping to lateral knee in area of distal IT band and around to patellar tendon area. 8/8: NuStep level 6 (new model) x 5 min while discussing status Quad progression: Supine QS x 20, TKE over small green noodle x 20, TKE over blue foam roller x 20 with 5 lb ankle weight, SLR x 10 with 5 lb, LAQ x 20 with 5 lb (all both sides) Seated up and over low cone x 20 with 5 lb ankle weights Seated hip ER with 5 lb ankle weights (bottom of  foot to opposite ankle with hip abducted) both Leg Press seat 7 x 20 bilateral with 70 lb, then singles x 20 with 35 lb heel raises and lowers with weight shifts 5x each way Floor sliders 3 ways 10x each side; arcs 10x each way Multi Hip: hip abd and  ext 2 x 10 with 40 lb both  Neuromuscular re-ed to activate quads and glute medius Therapeutic activity: sit to stand, single leg balance, ascending and descending curbs and steps   PATIENT EDUCATION:  Access Code: [email or web address] URL: [email or web address] Date: 03/07/2022 Prepared by: Ruben Im  Exercises - Seated Long Arc Quad  - 1 x daily - 7 x weekly - 3 sets - 10 reps - Seated Hip Abduction  - 1 x daily - 7 x weekly - 3 sets - 10 reps - Sit to Stand Without Arm Support  - 1 x daily - 7 x weekly - 1 sets - 10 reps - Side Stepping with Resistance at Thighs and Counter Support  - 1 x daily - 7 x weekly - 1 sets - 10 reps - Forward and Backward Step Over with Beam (BKA)  - 1 x daily - 7 x weekly - 1 sets - 10 reps - Supine ITB Stretch with Strap  - 1 x daily - 7 x weekly - 1 sets - 3 reps - 30 sec hold - Seated Hip Abduction with Resistance  - 1 x daily - 7 x weekly - 2 sets - 10 reps - Seated Isometric Hip Adduction with Ball  - 1 x daily - 7 x weekly - 2 sets - 10 reps - Supine Figure 4 Piriformis Stretch  - 1 x daily - 7 x weekly - 1 sets - 3 reps - 30 hold - Clamshell  - 1 x daily - 7 x weekly - 1 sets - 10 reps - Forward Step Up  - 1 x daily - 7 x weekly - 1 sets - 10 reps - Forward Step Down  - 1 x daily - 7 x weekly - 1 sets - 10 reps - Step Taps on High Step  - 1 x daily - 7 x weekly - 1 sets - 10 reps - Single Leg 3 Way Reach with Support  - 1 x daily - 7 x weekly - 1 sets - 10 reps - Standing Isometric Hip Abduction with Ball on Wall  - 1 x daily - 7 x weekly - 1 sets - 10 reps - Standing Hamstring Stretch with Step  - 1 x daily - 7 x weekly - 1 sets - 10 reps - Seated March with Ankle Weights at Foot  - 1 x daily  - 7 x weekly - 3 sets - 10 reps - Quarter Squat with Table  - 1 x daily - 7 x weekly - 1 sets - 10 reps - Standing Hamstring Stretch with Step  - 1 x daily - 7 x weekly - 1 sets - 10 reps - Seated March with Ankle Weights at Foot  - 1 x daily - 7 x weekly - 3 sets - 10 reps - ASSESSMENT:   CLINICAL IMPRESSION:   Harveen returns from vacation.  States she was able to enjoy her vacation and didn't have any exacerbation of pain.  Lateral knee pain on right is still present but mild and responds well to cupping.  She was able to do 10 reps of cone taps without loss of balance today on each LE indicating increased hip strength and knee stability.  She has 2-3 visits left and should be able to continue independently at that time.       OBJECTIVE IMPAIRMENTS Abnormal gait, decreased mobility, difficulty walking, decreased ROM, decreased strength, increased muscle spasms, impaired flexibility, and pain.    ACTIVITY LIMITATIONS cleaning,  community activity, driving, meal prep, occupation, Medical sales representative, yard work, and shopping.    PERSONAL FACTORS Age, Fitness, and 1-2 comorbidities: anxiety, bradycardia  are also affecting patient's functional outcome.      REHAB POTENTIAL: Good   CLINICAL DECISION MAKING: Stable/uncomplicated   EVALUATION COMPLEXITY: Low     GOALS: Goals reviewed with patient? Yes   SHORT TERM GOALS: Target date: 02/25/2022  (Remove Blue Hyperlink)   Patient will be independent with initial HEP  Baseline: Goal status: MET   2.  Pain report to be no greater than 4/10  Baseline:  Goal status: MET   3.  Patient to be able to demonstrate proper heel to toe progression Baseline:  Goal status: MET     LONG TERM GOALS: Target date: 05/16/2022   Patient to be independent with advanced HEP  Baseline:  Goal status: in Progress   2.  Patient to report pain no greater than 2/10  Baseline:  Goal status: In progress   3.  Patient to be able to ascend and descend steps safely  without knee instability episodes Baseline:  Goal status: In progress   4.  Patient to be able to bend, stoop and squat with pain no greater than 2/10 in bilateral knees and thighs.  Baseline:  Goal status: In Progress   5.  Patient to be able to walk 1/2 mile without need to rest.  Baseline:  Goal status: In Progress       PLAN: PT FREQUENCY: 2x/week   PT DURATION: 8 weeks   PLANNED INTERVENTIONS: Therapeutic exercises, Therapeutic activity, Neuromuscular re-education, Balance training, Gait training, Patient/Family education, Joint mobilization, Aquatic Therapy, Dry Needling, Electrical stimulation, Cryotherapy, Moist heat, Taping, Vasopneumatic device, Ultrasound, Ionotophoresis 4mg /ml Dexamethasone, and Manual therapy   PLAN FOR NEXT SESSION:  assess response to cupping, open chain quad strengthening; glute med strengthening; step ups/downs; leg press; hip machine; progressive loading; going to the beach next week  Marke Goodwyn B. Zebediah Beezley, PT 05/07/22 12:35 PM  [facility] [address], [place], [place] [address] Phone # [phone] Fax [phone]

## 2022-05-08 DIAGNOSIS — Z7901 Long term (current) use of anticoagulants: Secondary | ICD-10-CM | POA: Diagnosis not present

## 2022-05-09 ENCOUNTER — Ambulatory Visit: Payer: Medicare (Managed Care)

## 2022-05-09 DIAGNOSIS — R262 Difficulty in walking, not elsewhere classified: Secondary | ICD-10-CM

## 2022-05-09 DIAGNOSIS — M25561 Pain in right knee: Secondary | ICD-10-CM | POA: Diagnosis not present

## 2022-05-09 DIAGNOSIS — G8929 Other chronic pain: Secondary | ICD-10-CM

## 2022-05-09 DIAGNOSIS — M6281 Muscle weakness (generalized): Secondary | ICD-10-CM

## 2022-05-09 DIAGNOSIS — R252 Cramp and spasm: Secondary | ICD-10-CM

## 2022-05-09 NOTE — Therapy (Signed)
OUTPATIENT PHYSICAL THERAPY TREATMENT NOTE   Patient Name: Eilene Voigt MRN: [id number] DOB:1945-12-14, 76 y.o., female Today's Date: 05/09/2022   REFERRING PROVIDER: Nuala Alpha, MD  END OF SESSION:   PT End of Session - 05/09/22 1452     Visit Number 22    Date for PT Re-Evaluation 05/16/22    Authorization Type [employer] Advantage    Progress Note Due on Visit 59    PT Start Time 1445    PT Stop Time 0932    PT Time Calculation (min) 45 min    Activity Tolerance Patient tolerated treatment well    Behavior During Therapy [facility] for tasks assessed/performed               Past Medical History:  Diagnosis Date   AML (acute myeloblastic leukemia) ([place]) 1987   Anxiety    Appendicitis with peritoneal abscess 03/2015   s/p ex lap at [facility]   Atrial flutter [facility]) 2016   s/p cardioversion 02/2015, previously in [place], recently taken off amiodarone   Bradycardia 2015   s/p PPM insertion   H/O mitral valve replacement with mechanical valve    coumadin   Past Surgical History:  Procedure Laterality Date   APPENDECTOMY  03/24/2015   [facility] Dr Radene Knee.  perforated appendicitis   heart ablation     MITRAL VALVE REPLACEMENT  1986   PACEMAKER INSERTION     TUBAL LIGATION     Patient Active Problem List   Diagnosis Date Noted   Appendicitis with perforation s/p lap appy 03/22/2015 10/29/2015   Intra-abdominal abscess post-lap appy July 2016 10/29/2015   Intra-abdominal abscess ([place]) 10/29/2015   Abscess of abdominal cavity ([place]) 10/29/2015   Artificial cardiac pacemaker 05/17/2015   Sinoatrial node dysfunction ([place]) 05/17/2015   Long term current use of anticoagulant 03/22/2015   Encounter for therapeutic drug level monitoring 03/07/2015   Female genuine stress incontinence 03/25/2013   Anxiety disorder 08/14/2011   Dyslipidemia 08/13/2011   Atrial fibrillation ([place]) 06/11/2011   H/O mitral valve replacement with mechanical valve 06/11/2011    REFERRING DIAG:  M79.651 (ICD-10-CM) - Pain in right thigh M79.652 (ICD-10-CM) - Pain in left thigh      THERAPY DIAG: Chronic pain of right knee   Chronic pain of left knee   Difficulty in walking, not elsewhere classified   Muscle weakness (generalized)  PERTINENT HISTORY: Patient reports about 10 years ago she started having difficulty getting out of her low car and then covid hit and she became even more sedentary.  She feels she has gradually gotten weaker and the MD feels she may just be deconditioned.  She admits she did start a new statin drug about 3 to 4 months ago.  She describes her pain as muscle aching in the quads which worsens with activity and sit to stand. She hopes to gain strength and be able to get up and down from lower surfaces with increased ease.   Ankle pain seeing the doctor next week.   PRECAUTIONS: none  SUBJECTIVE: Patient denies any issues.  Lateral knee pain continues to improve.  "I bought my cups to do the cupping"    PAIN:  Are you having pain? Yes NPRS scale: 2-3/10 Pain location: right medial and lateral knee Aggravating factors: when I first stand up     OBJECTIVE: (objective measures completed at initial evaluation unless otherwise dated)  OBJECTIVE:    DIAGNOSTIC FINDINGS: none   PATIENT SURVEYS:  FOTO 44 (goal 29)  03/21/22:  63% 04/25/22:  71%   COGNITION:           Overall cognitive status: Within functional limits for tasks assessed                          SENSATION: WFL     POSTURE:  Adducted hips, poor alignment with mod valgus knees   PALPATION: Seated flexion and extension    LE ROM:  7/6:  Right and left knee flexion 129 degrees   (Blank rows = not tested)   LE MMT:  7/6:  hip abduction 4+/5    MMT Right 01/28/2022 Left 01/28/2022 7/6 8/8  Knee flexion WNL   5 5  Knee extension WN   4+ 4+   (Blank rows = not tested)   LOWER EXTREMITY SPECIAL TESTS:  Knee special tests: Apley's compression test: negative   FUNCTIONAL  TESTS:  5 times sit to stand: 13.10  (04/25/22 10.29 sec) Timed up and go (TUG): (04/25/22 7.90 sec)  03/21/22: TUG: 7.67 sec 5x sit to stand 12.74  04/25/22: TUG: 7.90 sec 5x sit to stand: 10.29 sec   GAIT: Distance walked: 50 ft Assistive device utilized: None Level of assistance: Modified independence Comments: Antalgic, slight trendelenburg    TODAY'S TREATMENT:  8/24: NuStep level 6 (new model) x 5 min while discussing status  Sit to stand x 10 with 10 lb kb Squat to chair x 10 with 10 lb kb Seated LAQ with 6 lb 2 x 10 both Seated march x 20 Seated up and over hurdle x 10 each with 6 lb ankle weights Seated hip ER with 6 lb ankle weights (bottom of foot to opposite ankle with hip abducted) both band walks with yellow loop x 3 laps Seated clam with yellow loop x 20 Step up 6" 2 x 10 without UE support 2" step down (heel tap only) 2 x 10 both without UE support Cupping to lateral knee in area of distal IT band and around to patellar tendon area 8/22: NuStep level 6 (new model) x 5 min while discussing status Leg Press seat 7 x 20 bilateral with 70 lb, then singles x 20 with 40 lb  Sit to stand x 10 with 10 lb kb Squat to chair x 10 with 10 lb kb Seated LAQ with 6 lb 2 x 10 both Seated march x 20 Seated up and over hurdle x 10 each with 6 lb ankle weights Seated hip ER with 6 lb ankle weights (bottom of foot to opposite ankle with hip abducted) both band walks with yellow loop x 3 laps Seated clam with yellow loop x 20 Standing hip ER with yellow loop x 20 Step up 6" 2 x 10 with left UE support 2" step down (heel tap only) Cupping to lateral knee in area of distal IT band and around to patellar tendon area  8/10: NuStep level 6 (new model) x 5 min while discussing status 20 visit re-assessment  Sit to stand x 10 Squat to chair x 10 Seated up and over low cone x 20 with 5 lb ankle weights Seated hip ER with 5 lb ankle weights (bottom of foot to opposite ankle with  hip abducted) both Leg Press seat 7 x 20 bilateral with 70 lb, then singles x 20 with 40 lb band walks with yellow loop x 3 laps Therapeutic activity: sit to stand, single leg balance, ascending and descending curbs and steps Cupping  to lateral knee in area of distal IT band and around to patellar tendon area. 8/8: NuStep level 6 (new model) x 5 min while discussing status Quad progression: Supine QS x 20, TKE over small green noodle x 20, TKE over blue foam roller x 20 with 5 lb ankle weight, SLR x 10 with 5 lb, LAQ x 20 with 5 lb (all both sides) Seated up and over low cone x 20 with 5 lb ankle weights Seated hip ER with 5 lb ankle weights (bottom of foot to opposite ankle with hip abducted) both Leg Press seat 7 x 20 bilateral with 70 lb, then singles x 20 with 35 lb heel raises and lowers with weight shifts 5x each way Floor sliders 3 ways 10x each side; arcs 10x each way Multi Hip: hip abd and ext 2 x 10 with 40 lb both  Neuromuscular re-ed to activate quads and glute medius Therapeutic activity: sit to stand, single leg balance, ascending and descending curbs and steps   PATIENT EDUCATION:  Access Code: [email or web address] URL: [email or web address] Date: 03/07/2022 Prepared by: Ruben Im  Exercises - Seated Long Arc Quad  - 1 x daily - 7 x weekly - 3 sets - 10 reps - Seated Hip Abduction  - 1 x daily - 7 x weekly - 3 sets - 10 reps - Sit to Stand Without Arm Support  - 1 x daily - 7 x weekly - 1 sets - 10 reps - Side Stepping with Resistance at Thighs and Counter Support  - 1 x daily - 7 x weekly - 1 sets - 10 reps - Forward and Backward Step Over with Beam (BKA)  - 1 x daily - 7 x weekly - 1 sets - 10 reps - Supine ITB Stretch with Strap  - 1 x daily - 7 x weekly - 1 sets - 3 reps - 30 sec hold - Seated Hip Abduction with Resistance  - 1 x daily - 7 x weekly - 2 sets - 10 reps - Seated Isometric Hip Adduction with Ball  - 1 x daily - 7 x weekly - 2 sets - 10 reps -  Supine Figure 4 Piriformis Stretch  - 1 x daily - 7 x weekly - 1 sets - 3 reps - 30 hold - Clamshell  - 1 x daily - 7 x weekly - 1 sets - 10 reps - Forward Step Up  - 1 x daily - 7 x weekly - 1 sets - 10 reps - Forward Step Down  - 1 x daily - 7 x weekly - 1 sets - 10 reps - Step Taps on High Step  - 1 x daily - 7 x weekly - 1 sets - 10 reps - Single Leg 3 Way Reach with Support  - 1 x daily - 7 x weekly - 1 sets - 10 reps - Standing Isometric Hip Abduction with Ball on Wall  - 1 x daily - 7 x weekly - 1 sets - 10 reps - Standing Hamstring Stretch with Step  - 1 x daily - 7 x weekly - 1 sets - 10 reps - Seated March with Ankle Weights at Foot  - 1 x daily - 7 x weekly - 3 sets - 10 reps - Quarter Squat with Table  - 1 x daily - 7 x weekly - 1 sets - 10 reps - Standing Hamstring Stretch with Step  - 1 x daily - 7 x weekly - 1 sets - 10  reps - Seated March with Ankle Weights at Foot  - 1 x daily - 7 x weekly - 3 sets - 10 reps - ASSESSMENT:   CLINICAL IMPRESSION:   Leliana continues to improve.  With new footwear,  improved quad and hip strength and attention to alignment, she is gaining function.  She has initiated a weight loss program with a wellness program.  This should further relieve her symptoms.   She has 2 visits left and should be able to continue independently at that time.       OBJECTIVE IMPAIRMENTS Abnormal gait, decreased mobility, difficulty walking, decreased ROM, decreased strength, increased muscle spasms, impaired flexibility, and pain.    ACTIVITY LIMITATIONS cleaning, community activity, driving, meal prep, occupation, laundry, yard work, and shopping.    PERSONAL FACTORS Age, Fitness, and 1-2 comorbidities: anxiety, bradycardia  are also affecting patient's functional outcome.      REHAB POTENTIAL: Good   CLINICAL DECISION MAKING: Stable/uncomplicated   EVALUATION COMPLEXITY: Low     GOALS: Goals reviewed with patient? Yes   SHORT TERM GOALS: Target date:  02/25/2022  (Remove Blue Hyperlink)   Patient will be independent with initial HEP  Baseline: Goal status: MET   2.  Pain report to be no greater than 4/10  Baseline:  Goal status: MET   3.  Patient to be able to demonstrate proper heel to toe progression Baseline:  Goal status: MET     LONG TERM GOALS: Target date: 05/16/2022   Patient to be independent with advanced HEP  Baseline:  Goal status: in Progress   2.  Patient to report pain no greater than 2/10  Baseline:  Goal status: MET   3.  Patient to be able to ascend and descend steps safely without knee instability episodes Baseline:  Goal status: In progress   4.  Patient to be able to bend, stoop and squat with pain no greater than 2/10 in bilateral knees and thighs.  Baseline:  Goal status: In Progress   5.  Patient to be able to walk 1/2 mile without need to rest.  Baseline:  Goal status: In Progress       PLAN: PT FREQUENCY: 2x/week   PT DURATION: 8 weeks   PLANNED INTERVENTIONS: Therapeutic exercises, Therapeutic activity, Neuromuscular re-education, Balance training, Gait training, Patient/Family education, Joint mobilization, Aquatic Therapy, Dry Needling, Electrical stimulation, Cryotherapy, Moist heat, Taping, Vasopneumatic device, Ultrasound, Ionotophoresis 66m/ml Dexamethasone, and Manual therapy   PLAN FOR NEXT SESSION:  Continue quad rehab and hip strengthening along with eccentric quad control  Konya Fauble B. Aundra Espin, PT 05/09/22 3:23 PM  [facility] [place] [id number] # [phone] [phone]

## 2022-05-14 ENCOUNTER — Ambulatory Visit: Payer: Medicare (Managed Care)

## 2022-05-14 DIAGNOSIS — M6281 Muscle weakness (generalized): Secondary | ICD-10-CM

## 2022-05-14 DIAGNOSIS — R252 Cramp and spasm: Secondary | ICD-10-CM

## 2022-05-14 DIAGNOSIS — G8929 Other chronic pain: Secondary | ICD-10-CM

## 2022-05-14 DIAGNOSIS — M25561 Pain in right knee: Secondary | ICD-10-CM | POA: Diagnosis not present

## 2022-05-14 DIAGNOSIS — R262 Difficulty in walking, not elsewhere classified: Secondary | ICD-10-CM

## 2022-05-14 NOTE — Therapy (Signed)
OUTPATIENT PHYSICAL THERAPY TREATMENT NOTE   Patient Name: Mercedes Thompson MRN: [id number] DOB:Nov 09, 1945, 76 y.o., female Today's Date: 05/14/2022   REFERRING PROVIDER: Nuala Alpha, MD  END OF SESSION:   PT End of Session - 05/14/22 1101     Visit Number 23    Date for PT Re-Evaluation 05/16/22    Authorization Type [employer]vantage    Progress Note Due on Visit 64    PT Start Time 1100    PT Stop Time 1138    PT Time Calculation (min) 38 min    Activity Tolerance Patient tolerated treatment well    Behavior During Therapy WFL for tasks assessed/performed               Past Medical History:  Diagnosis Date   AML (acute myeloblastic leukemia) ([place]) 1987   Anxiety    Appendicitis with peritoneal abscess 03/2015   s/p ex lap at [facility]   Atrial flutter [facility]) 2016   s/p cardioversion 02/2015, previously in [place], recently taken off amiodarone   Bradycardia 2015   s/p PPM insertion   H/O mitral valve replacement with mechanical valve    coumadin   Past Surgical History:  Procedure Laterality Date   APPENDECTOMY  03/24/2015   [facility] Dr Radene Knee.  perforated appendicitis   heart ablation     MITRAL VALVE REPLACEMENT  1986   PACEMAKER INSERTION     TUBAL LIGATION     Patient Active Problem List   Diagnosis Date Noted   Appendicitis with perforation s/p lap appy 03/22/2015 10/29/2015   Intra-abdominal abscess post-lap appy July 2016 10/29/2015   Intra-abdominal abscess ([place]) 10/29/2015   Abscess of abdominal cavity ([place]) 10/29/2015   Artificial cardiac pacemaker 05/17/2015   Sinoatrial node dysfunction ([place]) 05/17/2015   Long term current use of anticoagulant 03/22/2015   Encounter for therapeutic drug level monitoring 03/07/2015   Female genuine stress incontinence 03/25/2013   Anxiety disorder 08/14/2011   Dyslipidemia 08/13/2011   Atrial fibrillation ([place]) 06/11/2011   H/O mitral valve replacement with mechanical valve 06/11/2011    REFERRING DIAG:  M79.651 (ICD-10-CM) - Pain in right thigh M79.652 (ICD-10-CM) - Pain in left thigh      THERAPY DIAG: Chronic pain of right knee   Chronic pain of left knee   Difficulty in walking, not elsewhere classified   Muscle weakness (generalized)  PERTINENT HISTORY: Patient reports about 10 years ago she started having difficulty getting out of her low car and then covid hit and she became even more sedentary.  She feels she has gradually gotten weaker and the MD feels she may just be deconditioned.  She admits she did start a new statin drug about 3 to 4 months ago.  She describes her pain as muscle aching in the quads which worsens with activity and sit to stand. She hopes to gain strength and be able to get up and down from lower surfaces with increased ease.   Ankle pain seeing the doctor next week.   PRECAUTIONS: none  SUBJECTIVE: Patient states she has been doing the cupping and she is feeling a lot less pain at her lateral knee.  She also states that she dropped her statin medication to every other day and her legs are much less achy.  "I can stand up and sit down with very little effort now"  PAIN:  Are you having pain? Yes NPRS scale: 2-3/10 Pain location: right medial and lateral knee Aggravating factors: when I first stand  up     OBJECTIVE: (objective measures completed at initial evaluation unless otherwise dated)  OBJECTIVE:    DIAGNOSTIC FINDINGS: none   PATIENT SURVEYS:  FOTO 44 (goal 57) 03/21/22:  63% 04/25/22:  71%   COGNITION:           Overall cognitive status: Within functional limits for tasks assessed                          SENSATION: WFL     POSTURE:  Adducted hips, poor alignment with mod valgus knees   PALPATION: Seated flexion and extension    LE ROM:  7/6:  Right and left knee flexion 129 degrees   (Blank rows = not tested)   LE MMT:  7/6:  hip abduction 4+/5    MMT Right 01/28/2022 Left 01/28/2022 7/6 8/8  Knee flexion WNL   5 5  Knee  extension WN   4+ 4+   (Blank rows = not tested)   LOWER EXTREMITY SPECIAL TESTS:  Knee special tests: Apley's compression test: negative   FUNCTIONAL TESTS:  5 times sit to stand: 13.10  (04/25/22 10.29 sec) Timed up and go (TUG): (04/25/22 7.90 sec)  03/21/22: TUG: 7.67 sec 5x sit to stand 12.74  04/25/22: TUG: 7.90 sec 5x sit to stand: 10.29 sec   GAIT: Distance walked: 50 ft Assistive device utilized: None Level of assistance: Modified independence Comments: Antalgic, slight trendelenburg    TODAY'S TREATMENT:  8/29: NuStep level 6 (old model) x 5 min while discussing status  Sit to stand x 10 with 10 lb kb Squat to chair x 10 with 10 lb kb Seated LAQ with 6 lb 2 x 10 both Seated march x 20 with 6 lb both Seated up and over hurdle 2 x 10 each with 6 lb ankle weights Seated hip ER with 6 lb ankle weights (bottom of foot to opposite ankle with hip abducted) both band walks with yellow loop x 3 laps (10 steps each way) Seated clam with yellow loop x 20 Step up 6" 2 x 10 without UE support 4" step down  2 x 10 both without UE support (focus on heel down first) Cupping to lateral knee in area of distal IT band and around to patellar tendon area x 8 min 8/24: NuStep level 6 (new model) x 5 min while discussing status  Sit to stand x 10 with 10 lb kb Squat to chair x 10 with 10 lb kb Seated LAQ with 6 lb 2 x 10 both Seated march x 20 Seated up and over hurdle x 10 each with 6 lb ankle weights Seated hip ER with 6 lb ankle weights (bottom of foot to opposite ankle with hip abducted) both band walks with yellow loop x 3 laps Seated clam with yellow loop x 20 Step up 6" 2 x 10 without UE support 2" step down (heel tap only) 2 x 10 both without UE support Cupping to lateral knee in area of distal IT band and around to patellar tendon area 8/22: NuStep level 6 (new model) x 5 min while discussing status Leg Press seat 7 x 20 bilateral with 70 lb, then singles x 20 with 40 lb   Sit to stand x 10 with 10 lb kb Squat to chair x 10 with 10 lb kb Seated LAQ with 6 lb 2 x 10 both Seated march x 20 Seated up and over hurdle x 10 each with  6 lb ankle weights Seated hip ER with 6 lb ankle weights (bottom of foot to opposite ankle with hip abducted) both band walks with yellow loop x 3 laps Seated clam with yellow loop x 20 Standing hip ER with yellow loop x 20 Step up 6" 2 x 10 with left UE support 2" step down (heel tap only) Cupping to lateral knee in area of distal IT band and around to patellar tendon area  8/10: NuStep level 6 (new model) x 5 min while discussing status 20 visit re-assessment  Sit to stand x 10 Squat to chair x 10 Seated up and over low cone x 20 with 5 lb ankle weights Seated hip ER with 5 lb ankle weights (bottom of foot to opposite ankle with hip abducted) both Leg Press seat 7 x 20 bilateral with 70 lb, then singles x 20 with 40 lb band walks with yellow loop x 3 laps Therapeutic activity: sit to stand, single leg balance, ascending and descending curbs and steps Cupping to lateral knee in area of distal IT band and around to patellar tendon area.   PATIENT EDUCATION:  Access Code: [email or web address] URL: [email or web address] Date: 03/07/2022 Prepared by: Ruben Im  Exercises - Seated Long Arc Quad  - 1 x daily - 7 x weekly - 3 sets - 10 reps - Seated Hip Abduction  - 1 x daily - 7 x weekly - 3 sets - 10 reps - Sit to Stand Without Arm Support  - 1 x daily - 7 x weekly - 1 sets - 10 reps - Side Stepping with Resistance at Thighs and Counter Support  - 1 x daily - 7 x weekly - 1 sets - 10 reps - Forward and Backward Step Over with Beam (BKA)  - 1 x daily - 7 x weekly - 1 sets - 10 reps - Supine ITB Stretch with Strap  - 1 x daily - 7 x weekly - 1 sets - 3 reps - 30 sec hold - Seated Hip Abduction with Resistance  - 1 x daily - 7 x weekly - 2 sets - 10 reps - Seated Isometric Hip Adduction with Ball  - 1 x daily - 7 x weekly  - 2 sets - 10 reps - Supine Figure 4 Piriformis Stretch  - 1 x daily - 7 x weekly - 1 sets - 3 reps - 30 hold - Clamshell  - 1 x daily - 7 x weekly - 1 sets - 10 reps - Forward Step Up  - 1 x daily - 7 x weekly - 1 sets - 10 reps - Forward Step Down  - 1 x daily - 7 x weekly - 1 sets - 10 reps - Step Taps on High Step  - 1 x daily - 7 x weekly - 1 sets - 10 reps - Single Leg 3 Way Reach with Support  - 1 x daily - 7 x weekly - 1 sets - 10 reps - Standing Isometric Hip Abduction with Ball on Wall  - 1 x daily - 7 x weekly - 1 sets - 10 reps - Standing Hamstring Stretch with Step  - 1 x daily - 7 x weekly - 1 sets - 10 reps - Seated March with Ankle Weights at Foot  - 1 x daily - 7 x weekly - 3 sets - 10 reps - Quarter Squat with Table  - 1 x daily - 7 x weekly - 1 sets - 10 reps - Standing  Hamstring Stretch with Step  - 1 x daily - 7 x weekly - 1 sets - 10 reps - Seated March with Ankle Weights at Foot  - 1 x daily - 7 x weekly - 3 sets - 10 reps - ASSESSMENT:   CLINICAL IMPRESSION:   Mercedes Thompson continues to improve.  She is now cupping her lateral IT band at home and she had significant relief in generalized leg pain with decreasing her statin medication.    She has 1 visit left and should be able to continue independently at that time.       OBJECTIVE IMPAIRMENTS Abnormal gait, decreased mobility, difficulty walking, decreased ROM, decreased strength, increased muscle spasms, impaired flexibility, and pain.    ACTIVITY LIMITATIONS cleaning, community activity, driving, meal prep, occupation, laundry, yard work, and shopping.    PERSONAL FACTORS Age, Fitness, and 1-2 comorbidities: anxiety, bradycardia  are also affecting patient's functional outcome.      REHAB POTENTIAL: Good   CLINICAL DECISION MAKING: Stable/uncomplicated   EVALUATION COMPLEXITY: Low     GOALS: Goals reviewed with patient? Yes   SHORT TERM GOALS: Target date: 02/25/2022  (Remove Blue Hyperlink)   Patient will be  independent with initial HEP  Baseline: Goal status: MET   2.  Pain report to be no greater than 4/10  Baseline:  Goal status: MET   3.  Patient to be able to demonstrate proper heel to toe progression Baseline:  Goal status: MET     LONG TERM GOALS: Target date: 05/16/2022   Patient to be independent with advanced HEP  Baseline:  Goal status: in Progress   2.  Patient to report pain no greater than 2/10  Baseline:  Goal status: MET   3.  Patient to be able to ascend and descend steps safely without knee instability episodes Baseline:  Goal status: In progress   4.  Patient to be able to bend, stoop and squat with pain no greater than 2/10 in bilateral knees and thighs.  Baseline:  Goal status: In Progress   5.  Patient to be able to walk 1/2 mile without need to rest.  Baseline:  Goal status: In Progress       PLAN: PT FREQUENCY: 2x/week   PT DURATION: 8 weeks   PLANNED INTERVENTIONS: Therapeutic exercises, Therapeutic activity, Neuromuscular re-education, Balance training, Gait training, Patient/Family education, Joint mobilization, Aquatic Therapy, Dry Needling, Electrical stimulation, Cryotherapy, Moist heat, Taping, Vasopneumatic device, Ultrasound, Ionotophoresis 77m/ml Dexamethasone, and Manual therapy   PLAN FOR NEXT SESSION:  DC next visit.    JAnderson MaltaB. Vika Buske, PT 05/14/22 11:40 AM   [address]Johnson [place] SGonzalesGPalestine [place]Don[place] [address]Phone # [phone] [phone]

## 2022-05-16 ENCOUNTER — Ambulatory Visit: Payer: Medicare (Managed Care)

## 2022-05-16 DIAGNOSIS — R262 Difficulty in walking, not elsewhere classified: Secondary | ICD-10-CM

## 2022-05-16 DIAGNOSIS — M25561 Pain in right knee: Secondary | ICD-10-CM | POA: Diagnosis not present

## 2022-05-16 DIAGNOSIS — G8929 Other chronic pain: Secondary | ICD-10-CM

## 2022-05-16 DIAGNOSIS — M6281 Muscle weakness (generalized): Secondary | ICD-10-CM

## 2022-05-16 DIAGNOSIS — R252 Cramp and spasm: Secondary | ICD-10-CM

## 2022-05-16 NOTE — Therapy (Signed)
OUTPATIENT PHYSICAL THERAPY TREATMENT NOTE   Patient Name: Mercedes Thompson MRN: [id number] DOB:[birth year], 76 y.o., female Today's Date: 05/16/2022   REFERRING PROVIDER: Nuala Alpha, MD  END OF SESSION:   PT End of Session - 05/16/22 1132     Visit Number 24    Date for PT Re-Evaluation 05/16/22    Authorization Type [employer] Medicare Advantage    Progress Note Due on Visit 93    PT Start Time 1100    PT Stop Time 1130    PT Time Calculation (min) 30 min    Activity Tolerance Patient tolerated treatment well    Behavior During Therapy WFL for tasks assessed/performed                Past Medical History:  Diagnosis Date   AML (acute myeloblastic leukemia) ([place]) 1987   Anxiety    Appendicitis with peritoneal abscess 03/2015   [facility]   Atrial flutter [facility]) 2016   s/p cardioversion 02/2015, previously in [place], recently taken off amiodarone   Bradycardia 2015   s/p PPM insertion   H/O mitral valve replacement with mechanical valve    coumadin   Past Surgical History:  Procedure Laterality Date   APPENDECTOMY  03/24/2015   [facility] Dr Radene Knee.  perforated appendicitis   heart ablation     MITRAL VALVE REPLACEMENT  1986   PACEMAKER INSERTION     TUBAL LIGATION     Patient Active Problem List   Diagnosis Date Noted   Appendicitis with perforation s/p lap appy 03/22/2015 10/29/2015   Intra-abdominal abscess post-lap appy July 2016 10/29/2015   Intra-abdominal abscess ([place]) 10/29/2015   Abscess of abdominal cavity ([place]) 10/29/2015   Artificial cardiac pacemaker 05/17/2015   Sinoatrial node dysfunction ([place]) 05/17/2015   Long term current use of anticoagulant 03/22/2015   Encounter for therapeutic drug level monitoring 03/07/2015   Female genuine stress incontinence 03/25/2013   Anxiety disorder 08/14/2011   Dyslipidemia 08/13/2011   Atrial fibrillation ([place]) 06/11/2011   H/O mitral valve replacement with mechanical valve 06/11/2011    REFERRING  DIAG: M79.651 (ICD-10-CM) - Pain in right thigh M79.652 (ICD-10-CM) - Pain in left thigh      THERAPY DIAG: Chronic pain of right knee   Chronic pain of left knee   Difficulty in walking, not elsewhere classified   Muscle weakness (generalized)  PERTINENT HISTORY: Patient reports about 10 years ago she started having difficulty getting out of her low car and then covid hit and she became even more sedentary.  She feels she has gradually gotten weaker and the MD feels she may just be deconditioned.  She admits she did start a new statin drug about 3 to 4 months ago.  She describes her pain as muscle aching in the quads which worsens with activity and sit to stand. She hopes to gain strength and be able to get up and down from lower surfaces with increased ease.   Ankle pain seeing the doctor next week.   PRECAUTIONS: none  SUBJECTIVE: Patient states she feels good about her progress and is ready to continue on her own.  She states her pain is well controlled and only gets to 4/10 at the most and this is brief.    PAIN:  Are you having pain? Yes NPRS scale: 4/10 Pain location: right medial and lateral knee Aggravating factors: when I first stand up     OBJECTIVE: (objective measures completed at initial evaluation unless otherwise dated)  OBJECTIVE:    DIAGNOSTIC FINDINGS: none   PATIENT SURVEYS:  FOTO 44 (goal 68) 03/21/22:  63% 04/25/22:  71% 05/16/22:  59% (patient states she has hired a housekeeper so she answered some of her heavy household chore questions as "I don't do this".     COGNITION:           Overall cognitive status: Within functional limits for tasks assessed                          SENSATION: WFL     POSTURE:  Adducted hips, poor alignment with mod valgus knees (initial) On DC, patient demonstrates much improved alignment with new footwear and being more aware of knee position along with improved hip ER and abduction strength.     PALPATION: Seated  flexion and extension with minimal to no crepitus vs. Initial eval with moderate crepitus on right    LE ROM:  7/6:  Right and left knee flexion 129 degrees   (Blank rows = not tested)   LE MMT:  7/6:  hip abduction 4+/5    MMT Right 01/28/2022 Left 01/28/2022 7/6 8/8 8/31  Knee flexion WNL   _0 Knee extension WN   4+ 4+ 5   (Blank rows = not tested)   LOWER EXTREMITY SPECIAL TESTS:  Knee special tests: Apley's compression test: negative   FUNCTIONAL TESTS:    Intitial eval: Timed up and go (TUG): not completed 5 times sit to stand: 13.10  03/21/22: TUG: 7.67 sec 5x sit to stand 12.74  04/25/22: TUG: 7.90 sec 5x sit to stand: 10.29 sec  05/16/22: TUG: 7.45   5 x times sit to stand:10.80   GAIT: Distance walked: 50 ft Assistive device utilized: None Level of assistance: Modified independence Comments: Antalgic, slight trendelenburg    TODAY'S TREATMENT:  8/31: DC assessment completed Reviewed and updated entire HEP Educated on equipment and activities to do at her local fitness facility: instructions and printouts provided 8/29: NuStep level 6 (old model) x 5 min while discussing status  Sit to stand x 10 with 10 lb kb Squat to chair x 10 with 10 lb kb Seated LAQ with 6 lb 2 x 10 both Seated march x 20 with 6 lb both Seated up and over hurdle 2 x 10 each with 6 lb ankle weights Seated hip ER with 6 lb ankle weights (bottom of foot to opposite ankle with hip abducted) both band walks with yellow loop x 3 laps (10 steps each way) Seated clam with yellow loop x 20 Step up 6" 2 x 10 without UE support 4" step down  2 x 10 both without UE support (focus on heel down first) Cupping to lateral knee in area of distal IT band and around to patellar tendon area x 8 min 8/24: NuStep level 6 (new model) x 5 min while discussing status  Sit to stand x 10 with 10 lb kb Squat to chair x 10 with 10 lb kb Seated LAQ with 6 lb 2 x 10 both Seated march x 20 Seated up  and over hurdle x 10 each with 6 lb ankle weights Seated hip ER with 6 lb ankle weights (bottom of foot to opposite ankle with hip abducted) both band walks with yellow loop x 3 laps Seated clam with yellow loop x 20 Step up 6" 2 x 10 without UE support 2" step down (heel tap only) 2 x  10 both without UE support Cupping to lateral knee in area of distal IT band and around to patellar tendon area  PATIENT EDUCATION:  Access Code: [email or web address] URL: [email or web address] Date: 03/07/2022 Prepared by: Ruben Im  Exercises - Seated Long Arc Quad  - 1 x daily - 7 x weekly - 3 sets - 10 reps - Seated Hip Abduction  - 1 x daily - 7 x weekly - 3 sets - 10 reps - Sit to Stand Without Arm Support  - 1 x daily - 7 x weekly - 1 sets - 10 reps - Side Stepping with Resistance at Thighs and Counter Support  - 1 x daily - 7 x weekly - 1 sets - 10 reps - Forward and Backward Step Over with Beam (BKA)  - 1 x daily - 7 x weekly - 1 sets - 10 reps - Supine ITB Stretch with Strap  - 1 x daily - 7 x weekly - 1 sets - 3 reps - 30 sec hold - Seated Hip Abduction with Resistance  - 1 x daily - 7 x weekly - 2 sets - 10 reps - Seated Isometric Hip Adduction with Ball  - 1 x daily - 7 x weekly - 2 sets - 10 reps - Supine Figure 4 Piriformis Stretch  - 1 x daily - 7 x weekly - 1 sets - 3 reps - 30 hold - Clamshell  - 1 x daily - 7 x weekly - 1 sets - 10 reps - Forward Step Up  - 1 x daily - 7 x weekly - 1 sets - 10 reps - Forward Step Down  - 1 x daily - 7 x weekly - 1 sets - 10 reps - Step Taps on High Step  - 1 x daily - 7 x weekly - 1 sets - 10 reps - Single Leg 3 Way Reach with Support  - 1 x daily - 7 x weekly - 1 sets - 10 reps - Standing Isometric Hip Abduction with Ball on Wall  - 1 x daily - 7 x weekly - 1 sets - 10 reps - Standing Hamstring Stretch with Step  - 1 x daily - 7 x weekly - 1 sets - 10 reps - Seated March with Ankle Weights at Foot  - 1 x daily - 7 x weekly - 3 sets - 10 reps -  Quarter Squat with Table  - 1 x daily - 7 x weekly - 1 sets - 10 reps - Standing Hamstring Stretch with Step  - 1 x daily - 7 x weekly - 1 sets - 10 reps - Seated March with Ankle Weights at Foot  - 1 x daily - 7 x weekly - 3 sets - 10 reps - ASSESSMENT:   CLINICAL IMPRESSION:   Valicia has met all goals and demonstrates excellent improvements in overall function.  Her FOTO did score lower but she states that she put "I'm no able to do this" on several questions about household activities because she hired a Secretary/administrator and she doesn't have to do these activities any longer.  She has met all goals and is well motivated.  She should continue to do well with being compliant with her HEP and doing her gym workouts regularly.     OBJECTIVE IMPAIRMENTS Abnormal gait, decreased mobility, difficulty walking, decreased ROM, decreased strength, increased muscle spasms, impaired flexibility, and pain.    ACTIVITY LIMITATIONS cleaning, community activity, driving, meal prep, occupation, laundry, yard work, and shopping.  PERSONAL FACTORS Age, Fitness, and 1-2 comorbidities: anxiety, bradycardia  are also affecting patient's functional outcome.      REHAB POTENTIAL: Good   CLINICAL DECISION MAKING: Stable/uncomplicated   EVALUATION COMPLEXITY: Low     GOALS: Goals reviewed with patient? Yes   SHORT TERM GOALS: Target date: 02/25/2022  (Remove Blue Hyperlink)   Patient will be independent with initial HEP  Baseline: Goal status: MET   2.  Pain report to be no greater than 4/10  Baseline:  Goal status: MET   3.  Patient to be able to demonstrate proper heel to toe progression Baseline:  Goal status: MET     LONG TERM GOALS: Target date: 05/16/2022   Patient to be independent with advanced HEP  Baseline:  Goal status: MET   2.  Patient to report pain no greater than 2/10  Baseline:  Goal status: MET   3.  Patient to be able to ascend and descend steps safely without knee  instability episodes Baseline:  Goal status: MET   4.  Patient to be able to bend, stoop and squat with pain no greater than 2/10 in bilateral knees and thighs.  Baseline:  Goal status: MET   5.  Patient to be able to walk 1/2 mile without need to rest.  Baseline:  Goal status: MET        PLAN: PT FREQUENCY: 2x/week   PT DURATION: 8 weeks   PLANNED INTERVENTIONS: Therapeutic exercises, Therapeutic activity, Neuromuscular re-education, Balance training, Gait training, Patient/Family education, Joint mobilization, Aquatic Therapy, Dry Needling, Electrical stimulation, Cryotherapy, Moist heat, Taping, Vasopneumatic device, Ultrasound, Ionotophoresis 45m/ml Dexamethasone, and Manual therapy   PLAN FOR NEXT SESSION:  DC at this time.   PHYSICAL THERAPY DISCHARGE SUMMARY  Visits from Start of Care: 24  Current functional level related to goals / functional outcomes: See above   Remaining deficits: See above   Education / Equipment: See above   Patient agrees to discharge. Patient goals were met. Patient is being discharged due to meeting the stated rehab goals.     JAnderson MaltaB. Arius Harnois, PT 05/16/22 11:33 AM   BDunkirk[address] SLake BrownwoodGSouthside [place] [address]Phone # [phone] [phone]

## 2022-05-23 DIAGNOSIS — E785 Hyperlipidemia, unspecified: Secondary | ICD-10-CM | POA: Diagnosis not present

## 2022-05-23 DIAGNOSIS — Z01419 Encounter for gynecological examination (general) (routine) without abnormal findings: Secondary | ICD-10-CM | POA: Diagnosis not present

## 2022-05-23 DIAGNOSIS — N9089 Other specified noninflammatory disorders of vulva and perineum: Secondary | ICD-10-CM | POA: Diagnosis not present

## 2022-05-23 DIAGNOSIS — R7301 Impaired fasting glucose: Secondary | ICD-10-CM | POA: Diagnosis not present

## 2022-05-23 DIAGNOSIS — N763 Subacute and chronic vulvitis: Secondary | ICD-10-CM | POA: Diagnosis not present

## 2022-05-29 DIAGNOSIS — Z7901 Long term (current) use of anticoagulants: Secondary | ICD-10-CM | POA: Diagnosis not present

## 2022-05-30 DIAGNOSIS — J029 Acute pharyngitis, unspecified: Secondary | ICD-10-CM | POA: Diagnosis not present

## 2022-05-30 DIAGNOSIS — R509 Fever, unspecified: Secondary | ICD-10-CM | POA: Diagnosis not present

## 2022-05-30 DIAGNOSIS — J208 Acute bronchitis due to other specified organisms: Secondary | ICD-10-CM | POA: Diagnosis not present

## 2022-05-30 DIAGNOSIS — R062 Wheezing: Secondary | ICD-10-CM | POA: Diagnosis not present

## 2022-05-30 DIAGNOSIS — Z03818 Encounter for observation for suspected exposure to other biological agents ruled out: Secondary | ICD-10-CM | POA: Diagnosis not present

## 2022-05-30 DIAGNOSIS — R059 Cough, unspecified: Secondary | ICD-10-CM | POA: Diagnosis not present

## 2022-06-05 DIAGNOSIS — Z7901 Long term (current) use of anticoagulants: Secondary | ICD-10-CM | POA: Diagnosis not present

## 2022-06-05 DIAGNOSIS — J189 Pneumonia, unspecified organism: Secondary | ICD-10-CM | POA: Diagnosis not present

## 2022-06-17 DIAGNOSIS — Z6835 Body mass index (BMI) 35.0-35.9, adult: Secondary | ICD-10-CM | POA: Diagnosis not present

## 2022-06-17 DIAGNOSIS — E785 Hyperlipidemia, unspecified: Secondary | ICD-10-CM | POA: Diagnosis not present

## 2022-06-17 DIAGNOSIS — G4733 Obstructive sleep apnea (adult) (pediatric): Secondary | ICD-10-CM | POA: Diagnosis not present

## 2022-06-17 DIAGNOSIS — R7301 Impaired fasting glucose: Secondary | ICD-10-CM | POA: Diagnosis not present

## 2022-07-11 DIAGNOSIS — G4700 Insomnia, unspecified: Secondary | ICD-10-CM | POA: Diagnosis not present

## 2022-07-11 DIAGNOSIS — N644 Mastodynia: Secondary | ICD-10-CM | POA: Diagnosis not present

## 2022-07-11 DIAGNOSIS — L299 Pruritus, unspecified: Secondary | ICD-10-CM | POA: Diagnosis not present

## 2022-07-15 DIAGNOSIS — Z7901 Long term (current) use of anticoagulants: Secondary | ICD-10-CM | POA: Diagnosis not present

## 2022-07-16 DIAGNOSIS — Z6835 Body mass index (BMI) 35.0-35.9, adult: Secondary | ICD-10-CM | POA: Diagnosis not present

## 2022-07-16 DIAGNOSIS — G4733 Obstructive sleep apnea (adult) (pediatric): Secondary | ICD-10-CM | POA: Diagnosis not present

## 2022-07-16 DIAGNOSIS — R7303 Prediabetes: Secondary | ICD-10-CM | POA: Diagnosis not present

## 2022-07-16 DIAGNOSIS — E785 Hyperlipidemia, unspecified: Secondary | ICD-10-CM | POA: Diagnosis not present

## 2022-07-31 DIAGNOSIS — J0100 Acute maxillary sinusitis, unspecified: Secondary | ICD-10-CM | POA: Diagnosis not present

## 2022-08-01 DIAGNOSIS — E669 Obesity, unspecified: Secondary | ICD-10-CM | POA: Diagnosis not present

## 2022-08-01 DIAGNOSIS — G4733 Obstructive sleep apnea (adult) (pediatric): Secondary | ICD-10-CM | POA: Diagnosis not present

## 2022-08-01 DIAGNOSIS — I4891 Unspecified atrial fibrillation: Secondary | ICD-10-CM | POA: Diagnosis not present

## 2022-08-01 DIAGNOSIS — G4700 Insomnia, unspecified: Secondary | ICD-10-CM | POA: Diagnosis not present

## 2022-08-12 DIAGNOSIS — Z7901 Long term (current) use of anticoagulants: Secondary | ICD-10-CM | POA: Diagnosis not present

## 2022-08-16 DIAGNOSIS — E785 Hyperlipidemia, unspecified: Secondary | ICD-10-CM | POA: Diagnosis not present

## 2022-08-16 DIAGNOSIS — R7303 Prediabetes: Secondary | ICD-10-CM | POA: Diagnosis not present

## 2022-08-16 DIAGNOSIS — G720 Drug-induced myopathy: Secondary | ICD-10-CM | POA: Diagnosis not present

## 2022-08-16 DIAGNOSIS — T466X5A Adverse effect of antihyperlipidemic and antiarteriosclerotic drugs, initial encounter: Secondary | ICD-10-CM | POA: Diagnosis not present

## 2022-08-16 DIAGNOSIS — I4891 Unspecified atrial fibrillation: Secondary | ICD-10-CM | POA: Diagnosis not present

## 2022-08-16 DIAGNOSIS — R059 Cough, unspecified: Secondary | ICD-10-CM | POA: Diagnosis not present

## 2022-08-16 DIAGNOSIS — G4733 Obstructive sleep apnea (adult) (pediatric): Secondary | ICD-10-CM | POA: Diagnosis not present

## 2022-08-16 DIAGNOSIS — Z0000 Encounter for general adult medical examination without abnormal findings: Secondary | ICD-10-CM | POA: Diagnosis not present

## 2022-08-16 DIAGNOSIS — Z7901 Long term (current) use of anticoagulants: Secondary | ICD-10-CM | POA: Diagnosis not present

## 2022-08-16 DIAGNOSIS — E559 Vitamin D deficiency, unspecified: Secondary | ICD-10-CM | POA: Diagnosis not present

## 2022-08-16 DIAGNOSIS — Z79899 Other long term (current) drug therapy: Secondary | ICD-10-CM | POA: Diagnosis not present

## 2022-08-19 DIAGNOSIS — G4733 Obstructive sleep apnea (adult) (pediatric): Secondary | ICD-10-CM | POA: Diagnosis not present

## 2022-08-19 DIAGNOSIS — E785 Hyperlipidemia, unspecified: Secondary | ICD-10-CM | POA: Diagnosis not present

## 2022-08-19 DIAGNOSIS — R7303 Prediabetes: Secondary | ICD-10-CM | POA: Diagnosis not present

## 2022-08-19 DIAGNOSIS — Z6835 Body mass index (BMI) 35.0-35.9, adult: Secondary | ICD-10-CM | POA: Diagnosis not present

## 2022-08-22 ENCOUNTER — Other Ambulatory Visit: Payer: Self-pay | Admitting: Psychiatry

## 2022-08-22 DIAGNOSIS — F411 Generalized anxiety disorder: Secondary | ICD-10-CM

## 2022-08-22 NOTE — Telephone Encounter (Signed)
Please schedule appt

## 2022-08-27 NOTE — Telephone Encounter (Signed)
Pt called and made an appointment for 1/16

## 2022-09-19 DIAGNOSIS — I4891 Unspecified atrial fibrillation: Secondary | ICD-10-CM | POA: Diagnosis not present

## 2022-09-19 DIAGNOSIS — E669 Obesity, unspecified: Secondary | ICD-10-CM | POA: Diagnosis not present

## 2022-09-19 DIAGNOSIS — E785 Hyperlipidemia, unspecified: Secondary | ICD-10-CM | POA: Diagnosis not present

## 2022-09-19 DIAGNOSIS — G4733 Obstructive sleep apnea (adult) (pediatric): Secondary | ICD-10-CM | POA: Diagnosis not present

## 2022-09-19 DIAGNOSIS — Z7901 Long term (current) use of anticoagulants: Secondary | ICD-10-CM | POA: Diagnosis not present

## 2022-09-23 DIAGNOSIS — G4733 Obstructive sleep apnea (adult) (pediatric): Secondary | ICD-10-CM | POA: Diagnosis not present

## 2022-09-23 DIAGNOSIS — R7303 Prediabetes: Secondary | ICD-10-CM | POA: Diagnosis not present

## 2022-09-23 DIAGNOSIS — Z6835 Body mass index (BMI) 35.0-35.9, adult: Secondary | ICD-10-CM | POA: Diagnosis not present

## 2022-09-23 DIAGNOSIS — E785 Hyperlipidemia, unspecified: Secondary | ICD-10-CM | POA: Diagnosis not present

## 2022-09-26 DIAGNOSIS — L578 Other skin changes due to chronic exposure to nonionizing radiation: Secondary | ICD-10-CM | POA: Diagnosis not present

## 2022-09-26 DIAGNOSIS — L918 Other hypertrophic disorders of the skin: Secondary | ICD-10-CM | POA: Diagnosis not present

## 2022-09-26 DIAGNOSIS — L821 Other seborrheic keratosis: Secondary | ICD-10-CM | POA: Diagnosis not present

## 2022-09-26 DIAGNOSIS — D1801 Hemangioma of skin and subcutaneous tissue: Secondary | ICD-10-CM | POA: Diagnosis not present

## 2022-09-26 DIAGNOSIS — L814 Other melanin hyperpigmentation: Secondary | ICD-10-CM | POA: Diagnosis not present

## 2022-09-26 DIAGNOSIS — L820 Inflamed seborrheic keratosis: Secondary | ICD-10-CM | POA: Diagnosis not present

## 2022-09-27 DIAGNOSIS — J189 Pneumonia, unspecified organism: Secondary | ICD-10-CM | POA: Diagnosis not present

## 2022-09-27 DIAGNOSIS — R051 Acute cough: Secondary | ICD-10-CM | POA: Diagnosis not present

## 2022-10-01 ENCOUNTER — Ambulatory Visit: Payer: Self-pay | Admitting: Psychiatry

## 2022-10-01 DIAGNOSIS — J189 Pneumonia, unspecified organism: Secondary | ICD-10-CM | POA: Diagnosis not present

## 2022-10-07 DIAGNOSIS — J189 Pneumonia, unspecified organism: Secondary | ICD-10-CM | POA: Diagnosis not present

## 2022-10-14 DIAGNOSIS — Z7901 Long term (current) use of anticoagulants: Secondary | ICD-10-CM | POA: Diagnosis not present

## 2022-10-14 DIAGNOSIS — Z79899 Other long term (current) drug therapy: Secondary | ICD-10-CM | POA: Diagnosis not present

## 2022-10-14 DIAGNOSIS — E785 Hyperlipidemia, unspecified: Secondary | ICD-10-CM | POA: Diagnosis not present

## 2022-10-21 DIAGNOSIS — Z7901 Long term (current) use of anticoagulants: Secondary | ICD-10-CM | POA: Diagnosis not present

## 2022-10-22 DIAGNOSIS — R7303 Prediabetes: Secondary | ICD-10-CM | POA: Diagnosis not present

## 2022-10-22 DIAGNOSIS — G4733 Obstructive sleep apnea (adult) (pediatric): Secondary | ICD-10-CM | POA: Diagnosis not present

## 2022-10-22 DIAGNOSIS — Z6835 Body mass index (BMI) 35.0-35.9, adult: Secondary | ICD-10-CM | POA: Diagnosis not present

## 2022-10-22 DIAGNOSIS — E785 Hyperlipidemia, unspecified: Secondary | ICD-10-CM | POA: Diagnosis not present

## 2022-10-25 DIAGNOSIS — I495 Sick sinus syndrome: Secondary | ICD-10-CM | POA: Diagnosis not present

## 2022-10-25 DIAGNOSIS — I4819 Other persistent atrial fibrillation: Secondary | ICD-10-CM | POA: Diagnosis not present

## 2022-10-25 DIAGNOSIS — Z7901 Long term (current) use of anticoagulants: Secondary | ICD-10-CM | POA: Diagnosis not present

## 2022-10-25 DIAGNOSIS — Z950 Presence of cardiac pacemaker: Secondary | ICD-10-CM | POA: Diagnosis not present

## 2022-10-25 DIAGNOSIS — I484 Atypical atrial flutter: Secondary | ICD-10-CM | POA: Diagnosis not present

## 2022-10-25 DIAGNOSIS — I4892 Unspecified atrial flutter: Secondary | ICD-10-CM | POA: Diagnosis not present

## 2022-10-29 ENCOUNTER — Ambulatory Visit (INDEPENDENT_AMBULATORY_CARE_PROVIDER_SITE_OTHER): Payer: Medicare (Managed Care) | Admitting: Psychiatry

## 2022-10-29 ENCOUNTER — Encounter: Payer: Self-pay | Admitting: Psychiatry

## 2022-10-29 DIAGNOSIS — G4700 Insomnia, unspecified: Secondary | ICD-10-CM

## 2022-10-29 DIAGNOSIS — F411 Generalized anxiety disorder: Secondary | ICD-10-CM

## 2022-10-29 MED ORDER — ESCITALOPRAM OXALATE 10 MG PO TABS: 10.0000 mg | ORAL_TABLET | Freq: Every day | ORAL | 3 refills | Status: DC

## 2022-10-29 NOTE — Progress Notes (Signed)
Mercedes Thompson [id number] [birth year] 77 y.o.  Subjective:   Patient ID:  Mercedes Thompson is a 77 y.o. (DOB [birth year]) female.  Chief Complaint:  Chief Complaint  Patient presents with   Anxiety    Insomnia    HPI Mercedes Thompson presents to the office today for follow-up of anxiety and insomnia. She reports, "I'm not sleeping." She took Lorazepam since she had leukemia in 1987. Her PCP wanted her to wean off Lorazepam. She tried several other medications and had vivid dreams. Her PCP re-start Lorazepam 0.5 mg po QHS for insomnia. She sleeps 6-7 hours with Lorazepam, and occasionally 8 hours. She has seen sleep specialist at [facility]. She reports that she has an irregular sleep schedule and has been advised not to nap. She uses a cPap. She reports long-standing difficulty falling asleep. She reports that she has some anxiety and rumination when trying to fall asleep. She reports some occasional worry about family. No longer having rumination about ex-husband and his current wife. Denies persistent sad mood. Energy improved after cardioversion. Motivation has been ok. Appetite has been ok. Currently doing weight and wellness program through [facility]. She reports that she has lost 6 lbs since starting program. She reports adequate concentration. Denies SI.   Daughter is going through a divorce. Granddaughter is now 46 yo. Son is doing well and married now.   Had pneumonia in January and was on Prednisone.   Husband has been having vertigo and they have been spending more time at home. They are responsible for organizing a senior lunch bunch once a month for 60-70.   She has not seen therapist recently.    Trazodone- Vivid dreams Rozerem. Vivid dreams Lorazepam     Review of Systems:  Review of Systems  Cardiovascular:        Cardioversion last week   Musculoskeletal:  Positive for arthralgias. Negative for gait problem.  Psychiatric/Behavioral:         Please refer to HPI     Medications: I have reviewed the patient's current medications.  Current Outpatient Medications  Medication Sig Dispense Refill   budesonide-formoterol (SYMBICORT) 80-4.5 MCG/ACT inhaler Inhale 2 puffs into the lungs 2 (two) times daily.     calcium-vitamin D (OSCAL WITH D) 500-200 MG-UNIT tablet Take 1 tablet by mouth daily with breakfast.     cholecalciferol (VITAMIN D) 1000 UNITS tablet Take 1,000 Units by mouth daily.     fluticasone (FLONASE) 50 MCG/ACT nasal spray Place 2 sprays into the nose daily as needed.     furosemide (LASIX) 20 MG tablet Take 20 mg by mouth daily as needed for fluid (for fluid retention over 2 lbs).      Lactobacillus (PROBIOTIC ACIDOPHILUS PO) Take by mouth.     LORazepam (ATIVAN) 1 MG tablet Take 0.5 mg by mouth at bedtime.     Multiple Vitamin (MULTIVITAMIN WITH MINERALS) TABS tablet Take 1 tablet by mouth daily.     SOTALOL AF 80 MG TABS Take 80 mg by mouth 2 (two) times daily.     warfarin (COUMADIN) 5 MG tablet Take 7.5 mg by mouth every morning.     escitalopram (LEXAPRO) 10 MG tablet Take 1 tablet (10 mg total) by mouth daily. 90 tablet 3   ezetimibe (ZETIA) 10 MG tablet Take 1 tablet by mouth daily. (Patient not taking: Reported on 10/29/2022)     saccharomyces boulardii (FLORASTOR) 250 MG capsule Take 1 capsule (250 mg total) by mouth 2 (two) times daily. (Patient not  taking: Reported on 04/19/2021) 60 capsule 3   No current facility-administered medications for this visit.    Medication Side Effects: None  Allergies:  Allergies  Allergen Reactions   Bupropion Nausea Only    Other reaction(s): Unknown    Paroxetine Hcl Nausea Only    Other reaction(s): Unknown   Sertraline Nausea Only    Other reaction(s): Unknown   Sulfa Antibiotics     AFFECTS COUMADIN   Decongest-Aid [Pseudoephedrine] Palpitations    Past Medical History:  Diagnosis Date   AML (acute myeloblastic leukemia) ([place]) 1987   Anxiety    Appendicitis with peritoneal  abscess 03/2015   [facility]/p [facility]   Atrial flutter [facility]) 2016   s/p cardioversion 02/2015, previously in [place], recently taken off amiodarone   Bradycardia 2015   s/p PPM insertion   H/O mitral valve replacement with mechanical valve    coumadin    Past Medical History, Surgical history, Social history, and Family history were reviewed and updated as appropriate.   Please see review of systems for further details on the patient's review from today.   Objective:   Physical Exam:  There were no vitals taken for this visit.  Physical Exam Constitutional:      General: She is not in acute distress. Musculoskeletal:        General: No deformity.  Neurological:     Mental Status: She is alert and oriented to person, place, and time.     Coordination: Coordination normal.  Psychiatric:        Attention and Perception: Attention and perception normal. She does not perceive auditory or visual hallucinations.        Mood and Affect: Mood is anxious. Mood is not depressed. Affect is not labile, blunt, angry or inappropriate.        Speech: Speech normal.        Behavior: Behavior normal.        Thought Content: Thought content normal. Thought content is not paranoid or delusional. Thought content does not include homicidal or suicidal ideation. Thought content does not include homicidal or suicidal plan.        Cognition and Memory: Cognition and memory normal.        Judgment: Judgment normal.     Comments: Insight intact     Lab Review:     Component Value Date/Time   NA 139 10/30/2015 0543   K 4.2 10/30/2015 0543   CL 106 10/30/2015 0543   CO2 23 10/30/2015 0543   GLUCOSE 99 10/30/2015 0543   BUN 11 10/30/2015 0543   CREATININE 0.80 10/30/2015 0543   CALCIUM 8.6 (L) 10/30/2015 0543   PROT 8.0 10/29/2015 1015   ALBUMIN 4.0 10/29/2015 1015   AST 35 10/29/2015 1015   ALT 29 10/29/2015 1015   ALKPHOS 75 10/29/2015 1015   BILITOT 1.1 10/29/2015 1015   GFRNONAA >60  10/30/2015 0543   GFRAA >60 10/30/2015 0543       Component Value Date/Time   WBC 6.5 10/30/2015 0543   RBC 3.90 10/30/2015 0543   HGB 11.9 (L) 10/30/2015 0543   HCT 37.6 10/30/2015 0543   PLT 261 10/30/2015 0543   MCV 96.4 10/30/2015 0543   MCH 30.5 10/30/2015 0543   MCHC 31.6 10/30/2015 0543   RDW 13.6 10/30/2015 0543   LYMPHSABS 0.9 10/29/2015 1015   MONOABS 1.2 (H) 10/29/2015 1015   EOSABS 0.0 10/29/2015 1015   BASOSABS 0.0 10/29/2015 1015  No results found for: "POCLITH", "LITHIUM"   No results found for: "PHENYTOIN", "PHENOBARB", "VALPROATE", "CBMZ"   .res Assessment: Plan:    Pt seen for 30 minutes and time spent discussing sleep hygiene and option to possibly increase Lexapro to 15 mg po qd to improve anxiety. She reports that she prefers to first determine how she responds to re-start of Ativan 0.5 mg po QHS and working on establishing a sleep schedule. She reports that she will contact office if sleep and/or anxiety worsens and she would like to start trial of Lexapro 15 mg po qd.  Will continue Lexapro 10 mg po qd for anxiety. Pt to follow-up in one year or sooner if clinically indicated. Patient advised to contact office with any questions, adverse effects, or acute worsening in signs and symptoms.   Mercedes Thompson was seen today for anxiety.  Diagnoses and all orders for this visit:  Generalized anxiety disorder -     escitalopram (LEXAPRO) 10 MG tablet; Take 1 tablet (10 mg total) by mouth daily.  Insomnia, unspecified type     Please see After Visit Summary for patient specific instructions.  Future Appointments  Date Time Provider [place]  10/30/2023 11:00 AM Thayer Headings, PMHNP CP-CP None    No orders of the defined types were placed in this encounter.   -------------------------------

## 2022-11-14 DIAGNOSIS — M79605 Pain in left leg: Secondary | ICD-10-CM | POA: Diagnosis not present

## 2022-11-14 DIAGNOSIS — M79604 Pain in right leg: Secondary | ICD-10-CM | POA: Diagnosis not present

## 2022-11-15 DIAGNOSIS — Z4509 Encounter for adjustment and management of other cardiac device: Secondary | ICD-10-CM | POA: Diagnosis not present

## 2022-11-15 DIAGNOSIS — Z950 Presence of cardiac pacemaker: Secondary | ICD-10-CM | POA: Diagnosis not present

## 2022-11-15 DIAGNOSIS — I471 Supraventricular tachycardia, unspecified: Secondary | ICD-10-CM | POA: Diagnosis not present

## 2022-11-18 DIAGNOSIS — Z952 Presence of prosthetic heart valve: Secondary | ICD-10-CM | POA: Diagnosis not present

## 2022-11-19 DIAGNOSIS — Z7901 Long term (current) use of anticoagulants: Secondary | ICD-10-CM | POA: Diagnosis not present

## 2022-11-21 ENCOUNTER — Other Ambulatory Visit: Payer: Self-pay

## 2022-11-21 ENCOUNTER — Encounter: Payer: Self-pay | Admitting: Physical Therapy

## 2022-11-21 ENCOUNTER — Ambulatory Visit: Payer: Medicare (Managed Care) | Attending: Physician Assistant | Admitting: Physical Therapy

## 2022-11-21 DIAGNOSIS — M79605 Pain in left leg: Secondary | ICD-10-CM | POA: Diagnosis not present

## 2022-11-21 DIAGNOSIS — E785 Hyperlipidemia, unspecified: Secondary | ICD-10-CM | POA: Diagnosis not present

## 2022-11-21 DIAGNOSIS — R262 Difficulty in walking, not elsewhere classified: Secondary | ICD-10-CM | POA: Diagnosis not present

## 2022-11-21 DIAGNOSIS — M21061 Valgus deformity, not elsewhere classified, right knee: Secondary | ICD-10-CM | POA: Insufficient documentation

## 2022-11-21 DIAGNOSIS — R7303 Prediabetes: Secondary | ICD-10-CM | POA: Diagnosis not present

## 2022-11-21 DIAGNOSIS — Z6835 Body mass index (BMI) 35.0-35.9, adult: Secondary | ICD-10-CM | POA: Diagnosis not present

## 2022-11-21 DIAGNOSIS — M6281 Muscle weakness (generalized): Secondary | ICD-10-CM

## 2022-11-21 DIAGNOSIS — G4733 Obstructive sleep apnea (adult) (pediatric): Secondary | ICD-10-CM | POA: Diagnosis not present

## 2022-11-21 DIAGNOSIS — M79604 Pain in right leg: Secondary | ICD-10-CM

## 2022-11-21 NOTE — Therapy (Signed)
OUTPATIENT PHYSICAL THERAPY LOWER EXTREMITY EVALUATION   Patient Name: Mercedes Thompson MRN: [id number] DOB:[birth year], 77 y.o., female Today's Date: 11/21/2022  END OF SESSION:  PT End of Session - 11/21/22 1537     Visit Number 1    Date for PT Re-Evaluation 01/16/23    Authorization Type [employer]    PT Start Time 1531    PT Stop Time [id number]    PT Time Calculation (min) 44 min    Activity Tolerance Patient tolerated treatment well             Past Medical History:  Diagnosis Date   AML (acute myeloblastic leukemia) ([place]) 1987   Anxiety    Appendicitis with peritoneal abscess 03/2015   s/p ex lap at [facility]   Atrial flutter [facility]) 2016   s/p cardioversion 02/2015, previously in [place], recently taken off amiodarone   Bradycardia 2015   s/p PPM insertion   H/O mitral valve replacement with mechanical valve    coumadin   Past Surgical History:  Procedure Laterality Date   APPENDECTOMY  03/24/2015   [facility] Dr Radene Knee.  perforated appendicitis   heart ablation     MITRAL VALVE REPLACEMENT  1986   PACEMAKER INSERTION     TUBAL LIGATION     Patient Active Problem List   Diagnosis Date Noted   Appendicitis with perforation s/p lap appy 03/22/2015 10/29/2015   Intra-abdominal abscess post-lap appy July 2016 10/29/2015   Intra-abdominal abscess ([place]) 10/29/2015   Abscess of abdominal cavity ([place]) 10/29/2015   Artificial cardiac pacemaker 05/17/2015   Sinoatrial node dysfunction ([place]) 05/17/2015   Long term current use of anticoagulant 03/22/2015   Encounter for therapeutic drug level monitoring 03/07/2015   Female genuine stress incontinence 03/25/2013   Anxiety disorder 08/14/2011   Dyslipidemia 08/13/2011   Atrial fibrillation ([place]) 06/11/2011   H/O mitral valve replacement with mechanical valve 06/11/2011    PCP: Maurice Small MD  REFERRING PROVIDER: Sharyon Medicus PA-C  REFERRING DIAG: [phone], [phone] bil leg pain  THERAPY DIAG: bil leg pain;  weakness   Rationale for Evaluation and Treatment: Rehabilitation  ONSET DATE: Jan 2024  SUBJECTIVE:   SUBJECTIVE STATEMENT: Reports numerous illnesses in fall and winter including pneumonia with 6 week recovery period.  She attempted to restart ex's and when she did had the onset of bil LE pain left > right.  The discomfort is in the posterior thigh and lateral lower legs.  Walked around the block, sometimes walking aggravates   PERTINENT HISTORY: Pacemaker Right knee OA Wears orthotics  PAIN:  PAIN:  Are you having pain? Yes NPRS scale: 4/10 Pain location: left > right posterior thigh, lateral lower leg (not the knee joint)  Aggravating factors: walk, stiff in AM; stepping up on higher step Relieving factors: sitting  PRECAUTIONS: ICD/Pacemaker  WEIGHT BEARING RESTRICTIONS: No  FALLS:  Has patient fallen in last 6 months? No  LIVING ENVIRONMENT: Lives with: lives with their spouse Lives in: House/apartment Stairs: Yes: External: 3 steps; can reach both OCCUPATION: retired  PLOF: Independent  PATIENT GOALS: be more flexible; walk without pain (can't walk the block); get stronger    OBJECTIVE:   DIAGNOSTIC FINDINGS: per patient x-rays "very little knee OA"  PATIENT SURVEYS:  Lower Extremity Functional Scale LEFS:  56/80  COGNITION: Overall cognitive status: Within functional limits for tasks assessed      POSTURE: valgus knee right    LOWER EXTREMITY ROM:  right and left knee flexion 130 degrees; knee  extension +3 bil ;  standing flexion fingertips to floor  LOWER EXTREMITY MMT:  right > left pelvic drop with SLS 5 sec on both sides  MMT Right eval Left eval  Hip flexion 5 5  Hip extension    Hip abduction 4- 4-  Hip adduction    Hip internal rotation    Hip external rotation    Knee flexion    Knee extension 4- 4-  Ankle dorsiflexion    Ankle plantarflexion    Ankle inversion    Ankle eversion     (Blank rows = not tested)  LOWER  EXTREMITY SPECIAL TESTS:  LE discomfort produced with seated neural tension especially adding in ankle DF  FUNCTIONAL TESTS:  5x sit to stand:  12.48 no UE assist TUG 8.21 no UE assist   GAIT:  Comments: dec stride length, increased adduction; mild Trendelenburg;  stairs reciprocally bil handrails used  TODAY'S TREATMENT:                                                                                                                              DATE: 3/7    PATIENT EDUCATION:  Education details: Educated patient on anatomy and physiology of current symptoms, prognosis, plan of care as well as initial self care strategies to promote recovery  Person educated: Patient Education method: Explanation Education comprehension: verbalized understanding  HOME EXERCISE PROGRAM: Access Code: [email or web address] URL: [email or web address] Date: 11/21/2022 Prepared by: Ruben Im  Exercises - Seated Long Arc Quad  - 1 x daily - 7 x weekly - 3 sets - 10 reps - Seated Hip Abduction with Resistance  - 1 x daily - 7 x weekly - 2 sets - 10 reps - Supine ITB Stretch with Strap  - 1 x daily - 7 x weekly - 1 sets - 3 reps - 30 sec hold - Supine Figure 4 Piriformis Stretch  - 1 x daily - 7 x weekly - 1 sets - 3 reps - 30 hold - Clamshell  - 1 x daily - 7 x weekly - 1 sets - 10 reps - Side Stepping with Resistance at Thighs and Counter Support  - 1 x daily - 7 x weekly - 1 sets - 10 reps - Forward Step Down  - 1 x daily - 7 x weekly - 1 sets - 10 reps - Step Taps on High Step  - 1 x daily - 7 x weekly - 1 sets - 10 reps - Single Leg 3 Way Reach with Support  - 1 x daily - 7 x weekly - 1 sets - 10 reps - Standing Hamstring Stretch with Step  - 1 x daily - 7 x weekly - 1 sets - 10 reps - Seated March with Ankle Weights at Foot  - 1 x daily - 7 x weekly - 3 sets - 10 reps - Single Leg Knee Extension with Weight Machine  - 1 x daily - 7  x weekly - 3 sets - 10 reps - Single Leg Hamstring Curl with  Weight Machine  - 1 x daily - 7 x weekly - 3 sets - 10 reps - Full Leg Press with Resistance Around Knees  - 1 x daily - 7 x weekly - 3 sets - 10 reps - Hip Adduction Machine  - 1 x daily - 7 x weekly - 3 sets - 10 reps - Hip Abduction Machine  - 1 x daily - 7 x weekly - 3 sets - 10 reps - Standing Cable Hip Extension  - 1 x daily - 7 x weekly - 3 sets - 10 reps - Standing Cable Hip Flexion  - 1 x daily - 7 x weekly - 3 sets - 10 reps - Standing Cable Hip Abduction  - 1 x daily - 7 x weekly - 3 sets - 10 reps  3/7: - Supine Sciatic Nerve Glide  - 1 x daily - 7 x weekly - 1 sets - 10 reps - Seated Slump Nerve Glide (Mirrored)  - 1 x daily - 7 x weekly - 1 sets - 10 reps - Forward Step Up  - 1 x daily - 7 x weekly - 1 sets - 10 reps - Quarter Squat with Table  - 1 x daily - 7 x weekly - 1 sets - 10 reps - Sit to Stand Without Arm Support  - 1 x daily - 7 x weekly - 1 sets - 10 reps ASSESSMENT:  CLINICAL IMPRESSION: Patient is a 77 y.o. female who was seen today for physical therapy evaluation and treatment for bil leg pain following prolonged inactivity over the course of several months after multiple illnesses.  She attempted to restart her HEP but had the onset of posterior thigh and lateral lower leg pain as a result.  She reports symptoms are aggravated with walking, negotiating a  higher step and is especially stiff in the mornings.  The patient demonstrates decreased knee flexion and extension range of motion as well as patellar hypmobility in all directions.  Strength deficits include decreased quad strength and gluteal strength particularly glute medius muscle with  pelvic drop noted in standing and during gait.  Decreased muscle length noted as well in hip flexors and adductors.  These deficits and pain cause functional impairments with walking, going up and down curbs and steps at home and in the community.     OBJECTIVE IMPAIRMENTS: decreased activity tolerance, difficulty walking,  decreased strength, impaired perceived functional ability, and pain.   ACTIVITY LIMITATIONS: stairs and locomotion level  PARTICIPATION LIMITATIONS: cleaning, shopping, and community activity  PERSONAL FACTORS: 1 comorbidity: history of knee OA  are also affecting patient's functional outcome.   REHAB POTENTIAL: Good  CLINICAL DECISION MAKING: Stable/uncomplicated  EVALUATION COMPLEXITY: Low   GOALS: Goals reviewed with patient? Yes  SHORT TERM GOALS: Target date: 12/19/2022   The patient will demonstrate knowledge of basic self care strategies and exercises to promote healing   Baseline: Goal status: INITIAL  2.  The patient will report a 30% improvement in pain levels with functional activities which are currently difficult including walking and negotiating higher steps Baseline:  Goal status: INITIAL  3.  The patient will have improved hip and knee strength to at least 4/5 needed for standing, walking longer distances and descending stairs at home and in the community  Baseline:  Goal status: INITIAL  4.  The patient will be able to ambulate 6 min/600 feet  Baseline:  Goal status: INITIAL     LONG TERM GOALS: Target date: 01/16/2023   The patient will be independent in a safe self progression of a home exercise program to promote further recovery of function  Baseline:  Goal status: INITIAL  2.  The patient will report a 70% improvement in pain levels with functional activities which are currently difficult including  Baseline:  Goal status: INITIAL  3.  The patient will have improved hip and knee strength to at least 4+/5 needed for standing, walking longer distances and descending stairs at home and in the community  Baseline:  Goal status: INITIAL  4.  The patient will have improved gait stamina and strength to ambulate around the block Baseline:  Goal status: INITIAL  5.  LEFS score improved to   66/80 indicating improved function with less  pain Baseline:  Goal status: INITIAL   PLAN:  PT FREQUENCY: 2x/week  PT DURATION: 8 weeks  PLANNED INTERVENTIONS: Therapeutic exercises, Therapeutic activity, Neuromuscular re-education, Balance training, Gait training, Patient/Family education, Self Care, Joint mobilization, Aquatic Therapy, Dry Needling, Cryotherapy, Moist heat, Taping, Manual therapy, and Re-evaluation  PLAN FOR NEXT SESSION: do 2 or 3 min walk test;  review HEP; advice on avoiding "overstretching";  quad and glute strengthening  Ruben Im, PT 11/21/22 8:21 PM Phone: [phone] Fax: [phone]

## 2022-11-22 DIAGNOSIS — G4733 Obstructive sleep apnea (adult) (pediatric): Secondary | ICD-10-CM | POA: Diagnosis not present

## 2022-11-25 DIAGNOSIS — F411 Generalized anxiety disorder: Secondary | ICD-10-CM | POA: Diagnosis not present

## 2022-11-26 ENCOUNTER — Ambulatory Visit: Payer: Medicare (Managed Care) | Admitting: Physical Therapy

## 2022-11-26 DIAGNOSIS — M79604 Pain in right leg: Secondary | ICD-10-CM

## 2022-11-26 DIAGNOSIS — M6281 Muscle weakness (generalized): Secondary | ICD-10-CM

## 2022-11-26 DIAGNOSIS — G8929 Other chronic pain: Secondary | ICD-10-CM

## 2022-11-26 NOTE — Therapy (Signed)
OUTPATIENT PHYSICAL THERAPY LOWER EXTREMITY EVALUATION   Patient Name: Mercedes Thompson MRN: [id number] DOB:Mar 29, 1946, 77 y.o., female Today's Date: 11/26/2022  END OF SESSION:  PT End of Session - 11/26/22 1455     Visit Number 2    Date for PT Re-Evaluation 01/16/23    Authorization Type [employer]    PT Start Time 1445    PT Stop Time 1528    PT Time Calculation (min) 43 min    Activity Tolerance Patient tolerated treatment well             Past Medical History:  Diagnosis Date   AML (acute myeloblastic leukemia) ([place]) 1987   Anxiety    Appendicitis with peritoneal abscess 03/2015   s/p ex lap at [facility]   Atrial flutter [facility]) 2016   s/p cardioversion 02/2015, previously in [place], recently taken off amiodarone   Bradycardia 2015   s/p PPM insertion   H/O mitral valve replacement with mechanical valve    coumadin   Past Surgical History:  Procedure Laterality Date   APPENDECTOMY  03/24/2015   [facility] Dr Radene Knee.  perforated appendicitis   heart ablation     MITRAL VALVE REPLACEMENT  1986   PACEMAKER INSERTION     TUBAL LIGATION     Patient Active Problem List   Diagnosis Date Noted   Appendicitis with perforation s/p lap appy 03/22/2015 10/29/2015   Intra-abdominal abscess post-lap appy July 2016 10/29/2015   Intra-abdominal abscess ([place]) 10/29/2015   Abscess of abdominal cavity ([place]) 10/29/2015   Artificial cardiac pacemaker 05/17/2015   Sinoatrial node dysfunction ([place]) 05/17/2015   Long term current use of anticoagulant 03/22/2015   Encounter for therapeutic drug level monitoring 03/07/2015   Female genuine stress incontinence 03/25/2013   Anxiety disorder 08/14/2011   Dyslipidemia 08/13/2011   Atrial fibrillation ([place]) 06/11/2011   H/O mitral valve replacement with mechanical valve 06/11/2011    PCP: Maurice Small MD  REFERRING PROVIDER: Sharyon Medicus PA-C  REFERRING DIAG: [phone], M79.605 bil leg pain  THERAPY DIAG: bil leg pain;  weakness   Rationale for Evaluation and Treatment: Rehabilitation  ONSET DATE: Jan 2024  SUBJECTIVE:   SUBJECTIVE STATEMENT: Walked around the block today.  Thigh muscles were just tired.    PERTINENT HISTORY: Pacemaker HR 60-120 Right knee OA Wears orthotics  PAIN:  PAIN:  Are you having pain? Yes NPRS scale: 4/10 Pain location: left > right posterior thigh, lateral lower leg (not the knee joint)  Aggravating factors: walk, stiff in AM; stepping up on higher step Relieving factors: sitting  PRECAUTIONS: ICD/Pacemaker  WEIGHT BEARING RESTRICTIONS: No  FALLS:  Has patient fallen in last 6 months? No  LIVING ENVIRONMENT: Lives with: lives with their spouse Lives in: House/apartment Stairs: Yes: External: 3 steps; can reach both OCCUPATION: retired  PLOF: Independent  PATIENT GOALS: be more flexible; walk without pain (can't walk the block); get stronger    OBJECTIVE:   DIAGNOSTIC FINDINGS: per patient x-rays "very little knee OA"  PATIENT SURVEYS:  Lower Extremity Functional Scale LEFS:  56/80  COGNITION: Overall cognitive status: Within functional limits for tasks assessed      POSTURE: valgus knee right    LOWER EXTREMITY ROM:  right and left knee flexion 130 degrees; knee extension +3 bil ;  standing flexion fingertips to floor  LOWER EXTREMITY MMT:  right > left pelvic drop with SLS 5 sec on both sides  MMT Right eval Left eval  Hip flexion 5 5  Hip  extension    Hip abduction 4- 4-  Hip adduction    Hip internal rotation    Hip external rotation    Knee flexion    Knee extension 4- 4-  Ankle dorsiflexion    Ankle plantarflexion    Ankle inversion    Ankle eversion     (Blank rows = not tested)  LOWER EXTREMITY SPECIAL TESTS:  LE discomfort produced with seated neural tension especially adding in ankle DF  FUNCTIONAL TESTS:  5x sit to stand:  12.48 no UE assist TUG 8.21 no UE assist   GAIT:  Comments: dec stride length,  increased adduction; mild Trendelenburg;  stairs reciprocally bil handrails used  TODAY'S TREATMENT:                                                                                                                              DATE: 3/12 Nu-Step L2 5 min Seated nerve floss 10x right/left Supine nerve floss 10x right/left Sit to stand from tall table (no knee pain at all) 10x Squats 1/3 in front of tall table 10x 6 inch step ups 10x right/left single arm support Leg press 75# bil 20x; 40# 15x right/left Hip machine 40#: abduction, extension, flexion 10x each right/left  RPE 5/10 96%  O2 , HR 82 Discussed appropriate equipment when she goes to [employer] on stretch strap and hold on "overstretching"   PATIENT EDUCATION:  Education details: Educated patient on anatomy and physiology of current symptoms, prognosis, plan of care as well as initial self care strategies to promote recovery  Person educated: Patient Education method: Explanation Education comprehension: verbalized understanding  HOME EXERCISE PROGRAM: Access Code: [email or web address] URL: [email or web address] Date: 11/21/2022 Prepared by: Ruben Im  Exercises - Seated Long Arc Quad  - 1 x daily - 7 x weekly - 3 sets - 10 reps - Seated Hip Abduction with Resistance  - 1 x daily - 7 x weekly - 2 sets - 10 reps - Supine ITB Stretch with Strap  - 1 x daily - 7 x weekly - 1 sets - 3 reps - 30 sec hold - Supine Figure 4 Piriformis Stretch  - 1 x daily - 7 x weekly - 1 sets - 3 reps - 30 hold - Clamshell  - 1 x daily - 7 x weekly - 1 sets - 10 reps - Side Stepping with Resistance at Thighs and Counter Support  - 1 x daily - 7 x weekly - 1 sets - 10 reps - Forward Step Down  - 1 x daily - 7 x weekly - 1 sets - 10 reps - Step Taps on High Step  - 1 x daily - 7 x weekly - 1 sets - 10 reps - Single Leg 3 Way Reach with Support  - 1 x daily - 7 x weekly - 1 sets - 10 reps - Standing Hamstring Stretch with Step  - 1 x  daily - 7 x  weekly - 1 sets - 10 reps - Seated March with Ankle Weights at Foot  - 1 x daily - 7 x weekly - 3 sets - 10 reps - Single Leg Knee Extension with Weight Machine  - 1 x daily - 7 x weekly - 3 sets - 10 reps - Single Leg Hamstring Curl with Weight Machine  - 1 x daily - 7 x weekly - 3 sets - 10 reps - Full Leg Press with Resistance Around Knees  - 1 x daily - 7 x weekly - 3 sets - 10 reps - Hip Adduction Machine  - 1 x daily - 7 x weekly - 3 sets - 10 reps - Hip Abduction Machine  - 1 x daily - 7 x weekly - 3 sets - 10 reps - Standing Cable Hip Extension  - 1 x daily - 7 x weekly - 3 sets - 10 reps - Standing Cable Hip Flexion  - 1 x daily - 7 x weekly - 3 sets - 10 reps - Standing Cable Hip Abduction  - 1 x daily - 7 x weekly - 3 sets - 10 reps  3/7: - Supine Sciatic Nerve Glide  - 1 x daily - 7 x weekly - 1 sets - 10 reps - Seated Slump Nerve Glide (Mirrored)  - 1 x daily - 7 x weekly - 1 sets - 10 reps - Forward Step Up  - 1 x daily - 7 x weekly - 1 sets - 10 reps - Quarter Squat with Table  - 1 x daily - 7 x weekly - 1 sets - 10 reps - Sit to Stand Without Arm Support  - 1 x daily - 7 x weekly - 1 sets - 10 reps ASSESSMENT:  CLINICAL IMPRESSION: The patient reports significant muscle fatigue at the end of the treatment session and some dyspnea (had pneumonia in January).  Her O2 saturation is in 95-96% range.  She reports "pulling" sensations during exercises but not the knee pain she had experienced when she had PT over the summer.  Much improved patellofemoral alignment now although audible crepitus still present.  Her symptoms with this episode of care seem to be the result of overaggressive stretching with her stretch strap so I have instructed her in limiting her stretching and re-focus on strengthening.     OBJECTIVE IMPAIRMENTS: decreased activity tolerance, difficulty walking, decreased strength, impaired perceived functional ability, and pain.   ACTIVITY LIMITATIONS:  stairs and locomotion level  PARTICIPATION LIMITATIONS: cleaning, shopping, and community activity  PERSONAL FACTORS: 1 comorbidity: history of knee OA  are also affecting patient's functional outcome.   REHAB POTENTIAL: Good  CLINICAL DECISION MAKING: Stable/uncomplicated  EVALUATION COMPLEXITY: Low   GOALS: Goals reviewed with patient? Yes  SHORT TERM GOALS: Target date: 12/19/2022   The patient will demonstrate knowledge of basic self care strategies and exercises to promote healing   Baseline: Goal status: INITIAL  2.  The patient will report a 30% improvement in pain levels with functional activities which are currently difficult including walking and negotiating higher steps Baseline:  Goal status: INITIAL  3.  The patient will have improved hip and knee strength to at least 4/5 needed for standing, walking longer distances and descending stairs at home and in the community  Baseline:  Goal status: INITIAL  4.  The patient will be able to ambulate 6 min/600 feet  Baseline:  Goal status: INITIAL     LONG TERM GOALS: Target date: 01/16/2023  The patient will be independent in a safe self progression of a home exercise program to promote further recovery of function  Baseline:  Goal status: INITIAL  2.  The patient will report a 70% improvement in pain levels with functional activities which are currently difficult including  Baseline:  Goal status: INITIAL  3.  The patient will have improved hip and knee strength to at least 4+/5 needed for standing, walking longer distances and descending stairs at home and in the community  Baseline:  Goal status: INITIAL  4.  The patient will have improved gait stamina and strength to ambulate around the block Baseline:  Goal status: INITIAL  5.  LEFS score improved to   66/80 indicating improved function with less pain Baseline:  Goal status: INITIAL   PLAN:  PT FREQUENCY: 2x/week  PT DURATION: 8 weeks  PLANNED  INTERVENTIONS: Therapeutic exercises, Therapeutic activity, Neuromuscular re-education, Balance training, Gait training, Patient/Family education, Self Care, Joint mobilization, Aquatic Therapy, Dry Needling, Cryotherapy, Moist heat, Taping, Manual therapy, and Re-evaluation  PLAN FOR NEXT SESSION: lower quarter strengthening particularly quads and gluteals; progress HEP; advice on avoiding "overstretching"  Ruben Im, PT 11/26/22 5:13 PM Phone: [phone] Fax: [phone]

## 2022-12-02 DIAGNOSIS — F411 Generalized anxiety disorder: Secondary | ICD-10-CM | POA: Diagnosis not present

## 2022-12-04 ENCOUNTER — Ambulatory Visit: Payer: Medicare (Managed Care)

## 2022-12-04 DIAGNOSIS — R262 Difficulty in walking, not elsewhere classified: Secondary | ICD-10-CM

## 2022-12-04 DIAGNOSIS — M79604 Pain in right leg: Secondary | ICD-10-CM

## 2022-12-04 DIAGNOSIS — G8929 Other chronic pain: Secondary | ICD-10-CM

## 2022-12-04 DIAGNOSIS — M6281 Muscle weakness (generalized): Secondary | ICD-10-CM | POA: Diagnosis not present

## 2022-12-04 DIAGNOSIS — R252 Cramp and spasm: Secondary | ICD-10-CM

## 2022-12-04 NOTE — Therapy (Signed)
OUTPATIENT PHYSICAL THERAPY LOWER EXTREMITY EVALUATION   Patient Name: Mercedes Thompson MRN: [id number] DOB:11/05/1945, 77 y.o., female Today's Date: 12/05/2022  END OF SESSION:  PT End of Session - 12/04/22 1146     Visit Number 3    Date for PT Re-Evaluation 01/16/23    Authorization Type [employer]    PT Start Time 1146    PT Stop Time 1231    PT Time Calculation (min) 45 min    Activity Tolerance Patient tolerated treatment well    Behavior During Therapy WFL for tasks assessed/performed             Past Medical History:  Diagnosis Date   AML (acute myeloblastic leukemia) ([facility]) 1987   Anxiety    Appendicitis with peritoneal abscess 03/2015   [facility]   Atrial flutter [facility]) 2016   s/p cardioversion 02/2015, previously in [place], recently taken off amiodarone   Bradycardia 2015   s/p PPM insertion   H/O mitral valve replacement with mechanical valve    coumadin   Past Surgical History:  Procedure Laterality Date   APPENDECTOMY  03/24/2015   [facility] Dr Cherly Hensen.  perforated appendicitis   heart ablation     MITRAL VALVE REPLACEMENT  1986   PACEMAKER INSERTION     TUBAL LIGATION     Patient Active Problem List   Diagnosis Date Noted   Appendicitis with perforation s/p lap appy 03/22/2015 10/29/2015   Intra-abdominal abscess post-lap appy July 2016 10/29/2015   Intra-abdominal abscess ([facility]) 10/29/2015   Abscess of abdominal cavity ([facility]) 10/29/2015   Artificial cardiac pacemaker 05/17/2015   Sinoatrial node dysfunction ([facility]) 05/17/2015   Long term current use of anticoagulant 03/22/2015   Encounter for therapeutic drug level monitoring 03/07/2015   Female genuine stress incontinence 03/25/2013   Anxiety disorder 08/14/2011   Dyslipidemia 08/13/2011   Atrial fibrillation ([facility]) 06/11/2011   H/O mitral valve replacement with mechanical valve 06/11/2011    PCP: Shirlean Mylar MD  REFERRING PROVIDER: Raquel James PA-C  REFERRING DIAG: [phone],  M79.605 bil leg pain  THERAPY DIAG: bil leg pain; weakness   Rationale for Evaluation and Treatment: Rehabilitation  ONSET DATE: Jan 2024  SUBJECTIVE:   SUBJECTIVE STATEMENT: Slight right knee pain at times.  Soreness throughout both legs still present.  Still holding on IT band stretch due to causing pain.     PERTINENT HISTORY: Pacemaker HR 60-120 Right knee OA Wears orthotics  PAIN:  PAIN:  Are you having pain? Yes NPRS scale: 4/10 Pain location: left > right posterior thigh, lateral lower leg (not the knee joint)  Aggravating factors: walk, stiff in AM; stepping up on higher step Relieving factors: sitting  PRECAUTIONS: ICD/Pacemaker  WEIGHT BEARING RESTRICTIONS: No  FALLS:  Has patient fallen in last 6 months? No  LIVING ENVIRONMENT: Lives with: lives with their spouse Lives in: House/apartment Stairs: Yes: External: 3 steps; can reach both OCCUPATION: retired  PLOF: Independent  PATIENT GOALS: be more flexible; walk without pain (can't walk the block); get stronger    OBJECTIVE:   DIAGNOSTIC FINDINGS: per patient x-rays "very little knee OA"  PATIENT SURVEYS:  Lower Extremity Functional Scale LEFS:  56/80  COGNITION: Overall cognitive status: Within functional limits for tasks assessed      POSTURE: valgus knee right    LOWER EXTREMITY ROM:  right and left knee flexion 130 degrees; knee extension +3 bil ;  standing flexion fingertips to floor  LOWER EXTREMITY MMT:  right >  left pelvic drop with SLS 5 sec on both sides  MMT Right eval Left eval  Hip flexion 5 5  Hip extension    Hip abduction 4- 4-  Hip adduction    Hip internal rotation    Hip external rotation    Knee flexion    Knee extension 4- 4-  Ankle dorsiflexion    Ankle plantarflexion    Ankle inversion    Ankle eversion     (Blank rows = not tested)  LOWER EXTREMITY SPECIAL TESTS:  LE discomfort produced with seated neural tension especially adding in ankle  DF  FUNCTIONAL TESTS:  5x sit to stand:  12.48 no UE assist TUG 8.21 no UE assist   GAIT:  Comments: dec stride length, increased adduction; mild Trendelenburg;  stairs reciprocally bil handrails used  TODAY'S TREATMENT:                                                                                                                              DATE: 3/20 Nu-Step L7 5 min Seated clam with yellow loop x 20 Seated hamstring stretch x 3 each side holding 30 sec Seated nerve floss 10x right/left Supine nerve floss 10x right/left Sit to stand from tall table (no knee pain at all) 10x Squats 1/3 in front of tall table 10x Leg press 75# bil 20x; 40# 20x right/left 6 inch step ups 10x right/left single arm support Hip machine 40#: abduction, extension, flexion 20x each right/left  Reviewed supine hamstring, IT band and hip IR stretches to avoid "overstretching" Reviewed seated piriformis stretch.  DATE: 3/12 Nu-Step L2 5 min Seated nerve floss 10x right/left Supine nerve floss 10x right/left Sit to stand from tall table (no knee pain at all) 10x Squats 1/3 in front of tall table 10x 6 inch step ups 10x right/left single arm support Leg press 75# bil 20x; 40# 15x right/left Hip machine 40#: abduction, extension, flexion 10x each right/left  RPE 5/10 96%  O2 , HR 82 Discussed appropriate equipment when she goes to [employer] on stretch strap and hold on "overstretching"   PATIENT EDUCATION:  Education details: Educated patient on anatomy and physiology of current symptoms, prognosis, plan of care as well as initial self care strategies to promote recovery  Person educated: Patient Education method: Explanation Education comprehension: verbalized understanding  HOME EXERCISE PROGRAM: Access Code: [email or web address] URL: [email or web address] Date: 11/21/2022 Prepared by: Lavinia Sharps  Exercises - Seated Long Arc Quad  - 1 x daily - 7 x weekly - 3 sets - 10 reps -  Seated Hip Abduction with Resistance  - 1 x daily - 7 x weekly - 2 sets - 10 reps - Supine ITB Stretch with Strap  - 1 x daily - 7 x weekly - 1 sets - 3 reps - 30 sec hold - Supine Figure 4 Piriformis Stretch  - 1 x daily - 7 x weekly - 1 sets - 3 reps - 30  hold - Clamshell  - 1 x daily - 7 x weekly - 1 sets - 10 reps - Side Stepping with Resistance at Thighs and Counter Support  - 1 x daily - 7 x weekly - 1 sets - 10 reps - Forward Step Down  - 1 x daily - 7 x weekly - 1 sets - 10 reps - Step Taps on High Step  - 1 x daily - 7 x weekly - 1 sets - 10 reps - Single Leg 3 Way Reach with Support  - 1 x daily - 7 x weekly - 1 sets - 10 reps - Standing Hamstring Stretch with Step  - 1 x daily - 7 x weekly - 1 sets - 10 reps - Seated March with Ankle Weights at Foot  - 1 x daily - 7 x weekly - 3 sets - 10 reps - Single Leg Knee Extension with Weight Machine  - 1 x daily - 7 x weekly - 3 sets - 10 reps - Single Leg Hamstring Curl with Weight Machine  - 1 x daily - 7 x weekly - 3 sets - 10 reps - Full Leg Press with Resistance Around Knees  - 1 x daily - 7 x weekly - 3 sets - 10 reps - Hip Adduction Machine  - 1 x daily - 7 x weekly - 3 sets - 10 reps - Hip Abduction Machine  - 1 x daily - 7 x weekly - 3 sets - 10 reps - Standing Cable Hip Extension  - 1 x daily - 7 x weekly - 3 sets - 10 reps - Standing Cable Hip Flexion  - 1 x daily - 7 x weekly - 3 sets - 10 reps - Standing Cable Hip Abduction  - 1 x daily - 7 x weekly - 3 sets - 10 reps  3/7: - Supine Sciatic Nerve Glide  - 1 x daily - 7 x weekly - 1 sets - 10 reps - Seated Slump Nerve Glide (Mirrored)  - 1 x daily - 7 x weekly - 1 sets - 10 reps - Forward Step Up  - 1 x daily - 7 x weekly - 1 sets - 10 reps - Quarter Squat with Table  - 1 x daily - 7 x weekly - 1 sets - 10 reps - Sit to Stand Without Arm Support  - 1 x daily - 7 x weekly - 1 sets - 10 reps ASSESSMENT:  CLINICAL IMPRESSION: Rosea continues to have some discomfort in both  legs and right knee.  She fatigues easily but needing less rest breaks.  She was doing her stretches slightly too aggressive so we corrected this.  She was able to do these with less pain with several adjustments.  She has severe right knee valgus deformity but MD says no significant OA at this point.  She does have tracking issues, however, that will gradually cause degenerative changes if quad is not working efficiently and hips are weak.  She should benefit from continuing skilled PT for LE strengthening and flexibility exercises.     OBJECTIVE IMPAIRMENTS: decreased activity tolerance, difficulty walking, decreased strength, impaired perceived functional ability, and pain.   ACTIVITY LIMITATIONS: stairs and locomotion level  PARTICIPATION LIMITATIONS: cleaning, shopping, and community activity  PERSONAL FACTORS: 1 comorbidity: history of knee OA  are also affecting patient's functional outcome.   REHAB POTENTIAL: Good  CLINICAL DECISION MAKING: Stable/uncomplicated  EVALUATION COMPLEXITY: Low   GOALS: Goals reviewed with patient? Yes  SHORT TERM GOALS: Target date: 12/19/2022   The patient will demonstrate knowledge of basic self care strategies and exercises to promote healing   Baseline: Goal status: INITIAL  2.  The patient will report a 30% improvement in pain levels with functional activities which are currently difficult including walking and negotiating higher steps Baseline:  Goal status: INITIAL  3.  The patient will have improved hip and knee strength to at least 4/5 needed for standing, walking longer distances and descending stairs at home and in the community  Baseline:  Goal status: INITIAL  4.  The patient will be able to ambulate 6 min/600 feet  Baseline:  Goal status: INITIAL     LONG TERM GOALS: Target date: 01/16/2023   The patient will be independent in a safe self progression of a home exercise program to promote further recovery of function   Baseline:  Goal status: INITIAL  2.  The patient will report a 70% improvement in pain levels with functional activities which are currently difficult including  Baseline:  Goal status: INITIAL  3.  The patient will have improved hip and knee strength to at least 4+/5 needed for standing, walking longer distances and descending stairs at home and in the community  Baseline:  Goal status: INITIAL  4.  The patient will have improved gait stamina and strength to ambulate around the block Baseline:  Goal status: INITIAL  5.  LEFS score improved to   66/80 indicating improved function with less pain Baseline:  Goal status: INITIAL   PLAN:  PT FREQUENCY: 2x/week  PT DURATION: 8 weeks  PLANNED INTERVENTIONS: Therapeutic exercises, Therapeutic activity, Neuromuscular re-education, Balance training, Gait training, Patient/Family education, Self Care, Joint mobilization, Aquatic Therapy, Dry Needling, Cryotherapy, Moist heat, Taping, Manual therapy, and Re-evaluation  PLAN FOR NEXT SESSION: lower quarter strengthening particularly quads and gluteals; progress HEP; advice on avoiding "overstretching"  Bradden Tadros B. Dametrius Sanjuan, PT 12/05/22 12:05 AM [facility] [address] [place], [place] [address] Phone # [phone] Fax [phone]

## 2022-12-05 NOTE — Therapy (Incomplete)
OUTPATIENT PHYSICAL THERAPY LOWER EXTREMITY EVALUATION   Patient Name: Mercedes Thompson MRN: [id number] DOB:Apr 16, 1946, 77 y.o., female Today's Date: 12/04/2022  END OF SESSION:  PT End of Session - 12/04/22 1146     Visit Number 3    Date for PT Re-Evaluation 01/16/23    Authorization Type [employer]    PT Start Time 1146    PT Stop Time 1231    PT Time Calculation (min) 45 min    Activity Tolerance Patient tolerated treatment well    Behavior During Therapy WFL for tasks assessed/performed             Past Medical History:  Diagnosis Date  . AML (acute myeloblastic leukemia) ([place]) 1987  . Anxiety   . Appendicitis with peritoneal abscess 03/2015   s/p ex lap at [facility]  . Atrial flutter [facility]) 2016   s/p cardioversion 02/2015, previously in [place], recently taken off amiodarone  . Bradycardia 2015   s/p PPM insertion  . H/O mitral valve replacement with mechanical valve    coumadin   Past Surgical History:  Procedure Laterality Date  . APPENDECTOMY  03/24/2015   [facility] Dr Radene Knee.  perforated appendicitis  . heart ablation    . MITRAL VALVE REPLACEMENT  1986  . PACEMAKER INSERTION    . TUBAL LIGATION     Patient Active Problem List   Diagnosis Date Noted  . Appendicitis with perforation s/p lap appy 03/22/2015 10/29/2015  . Intra-abdominal abscess post-lap appy July 2016 10/29/2015  . Intra-abdominal abscess ([place]) 10/29/2015  . Abscess of abdominal cavity ([place]) 10/29/2015  . Artificial cardiac pacemaker 05/17/2015  . Sinoatrial node dysfunction (H[facility]) 05/17/2015  . Long term current use of anticoagulant 03/22/2015  . Encounter for therapeutic drug level monitoring 03/07/2015  . Female genuine stress incontinence 03/25/2013  . Anxiety disorder 08/14/2011  . Dyslipidemia 08/13/2011  . Atrial fibrillation ([place]) 06/11/2011  . H/O mitral valve replacement with mechanical valve 06/11/2011    PCP: Maurice Small MD  REFERRING PROVIDER: Sharyon Medicus  PA-C  REFERRING DIAG: [phone], M79.605 bil leg pain  THERAPY DIAG: bil leg pain; weakness   Rationale for Evaluation and Treatment: Rehabilitation  ONSET DATE: Jan 2024  SUBJECTIVE:   SUBJECTIVE STATEMENT: Slight right knee pain at times.  Soreness throughout both legs still present.  Still holding on IT band stretch due to causing pain.     PERTINENT HISTORY: Pacemaker HR 60-120 Right knee OA Wears orthotics  PAIN:  PAIN:  Are you having pain? Yes NPRS scale: 4/10 Pain location: left > right posterior thigh, lateral lower leg (not the knee joint)  Aggravating factors: walk, stiff in AM; stepping up on higher step Relieving factors: sitting  PRECAUTIONS: ICD/Pacemaker  WEIGHT BEARING RESTRICTIONS: No  FALLS:  Has patient fallen in last 6 months? No  LIVING ENVIRONMENT: Lives with: lives with their spouse Lives in: House/apartment Stairs: Yes: External: 3 steps; can reach both OCCUPATION: retired  PLOF: Independent  PATIENT GOALS: be more flexible; walk without pain (can't walk the block); get stronger    OBJECTIVE:   DIAGNOSTIC FINDINGS: per patient x-rays "very little knee OA"  PATIENT SURVEYS:  Lower Extremity Functional Scale LEFS:  56/80  COGNITION: Overall cognitive status: Within functional limits for tasks assessed      POSTURE: valgus knee right    LOWER EXTREMITY ROM:  right and left knee flexion 130 degrees; knee extension +3 bil ;  standing flexion fingertips to floor  LOWER EXTREMITY MMT:  right >  left pelvic drop with SLS 5 sec on both sides  MMT Right eval Left eval  Hip flexion 5 5  Hip extension    Hip abduction 4- 4-  Hip adduction    Hip internal rotation    Hip external rotation    Knee flexion    Knee extension 4- 4-  Ankle dorsiflexion    Ankle plantarflexion    Ankle inversion    Ankle eversion     (Blank rows = not tested)  LOWER EXTREMITY SPECIAL TESTS:  LE discomfort produced with seated neural tension  especially adding in ankle DF  FUNCTIONAL TESTS:  5x sit to stand:  12.48 no UE assist TUG 8.21 no UE assist   GAIT:  Comments: dec stride length, increased adduction; mild Trendelenburg;  stairs reciprocally bil handrails used  TODAY'S TREATMENT:                                                                                                                              DATE: 3/20 Nu-Step L7 5 min Seated clam with yellow loop x 20 Seated hamstring stretch x 3 each side holding 30 sec Seated nerve floss 10x right/left Supine nerve floss 10x right/left Sit to stand from tall table (no knee pain at all) 10x Squats 1/3 in front of tall table 10x Leg press 75# bil 20x; 40# 20x right/left 6 inch step ups 10x right/left single arm support Hip machine 40#: abduction, extension, flexion 20x each right/left  Reviewed supine hamstring, IT band and hip IR stretches to avoid "overstretching" Reviewed seated piriformis stretch.  DATE: 3/12 Nu-Step L2 5 min Seated nerve floss 10x right/left Supine nerve floss 10x right/left Sit to stand from tall table (no knee pain at all) 10x Squats 1/3 in front of tall table 10x 6 inch step ups 10x right/left single arm support Leg press 75# bil 20x; 40# 15x right/left Hip machine 40#: abduction, extension, flexion 10x each right/left  RPE 5/10 96%  O2 , HR 82 Discussed appropriate equipment when she goes to [employer] on stretch strap and hold on "overstretching"   PATIENT EDUCATION:  Education details: Educated patient on anatomy and physiology of current symptoms, prognosis, plan of care as well as initial self care strategies to promote recovery  Person educated: Patient Education method: Explanation Education comprehension: verbalized understanding  HOME EXERCISE PROGRAM: Access Code: [email or web address] URL: [email or web address] Date: 11/21/2022 Prepared by: Ruben Im  Exercises - Seated Long Arc Quad  - 1 x daily - 7 x weekly  - 3 sets - 10 reps - Seated Hip Abduction with Resistance  - 1 x daily - 7 x weekly - 2 sets - 10 reps - Supine ITB Stretch with Strap  - 1 x daily - 7 x weekly - 1 sets - 3 reps - 30 sec hold - Supine Figure 4 Piriformis Stretch  - 1 x daily - 7 x weekly - 1 sets - 3 reps - 30  hold - Clamshell  - 1 x daily - 7 x weekly - 1 sets - 10 reps - Side Stepping with Resistance at Thighs and Counter Support  - 1 x daily - 7 x weekly - 1 sets - 10 reps - Forward Step Down  - 1 x daily - 7 x weekly - 1 sets - 10 reps - Step Taps on High Step  - 1 x daily - 7 x weekly - 1 sets - 10 reps - Single Leg 3 Way Reach with Support  - 1 x daily - 7 x weekly - 1 sets - 10 reps - Standing Hamstring Stretch with Step  - 1 x daily - 7 x weekly - 1 sets - 10 reps - Seated March with Ankle Weights at Foot  - 1 x daily - 7 x weekly - 3 sets - 10 reps - Single Leg Knee Extension with Weight Machine  - 1 x daily - 7 x weekly - 3 sets - 10 reps - Single Leg Hamstring Curl with Weight Machine  - 1 x daily - 7 x weekly - 3 sets - 10 reps - Full Leg Press with Resistance Around Knees  - 1 x daily - 7 x weekly - 3 sets - 10 reps - Hip Adduction Machine  - 1 x daily - 7 x weekly - 3 sets - 10 reps - Hip Abduction Machine  - 1 x daily - 7 x weekly - 3 sets - 10 reps - Standing Cable Hip Extension  - 1 x daily - 7 x weekly - 3 sets - 10 reps - Standing Cable Hip Flexion  - 1 x daily - 7 x weekly - 3 sets - 10 reps - Standing Cable Hip Abduction  - 1 x daily - 7 x weekly - 3 sets - 10 reps  3/7: - Supine Sciatic Nerve Glide  - 1 x daily - 7 x weekly - 1 sets - 10 reps - Seated Slump Nerve Glide (Mirrored)  - 1 x daily - 7 x weekly - 1 sets - 10 reps - Forward Step Up  - 1 x daily - 7 x weekly - 1 sets - 10 reps - Quarter Squat with Table  - 1 x daily - 7 x weekly - 1 sets - 10 reps - Sit to Stand Without Arm Support  - 1 x daily - 7 x weekly - 1 sets - 10 reps ASSESSMENT:  CLINICAL IMPRESSION: Mayella continues to have some  discomfort in both legs and right knee.  She fatigues easily but needing less rest breaks.  She was doing her stretches slightly too aggressive so we corrected this.  She was able to do these with less pain with several adjustments.  She has severe right knee valgus deformity but MD says no significant OA at this point.  She does have tracking issues, however, that will gradually cause degenerative changes if quad is not working efficiently and hips are weak.  She should benefit    OBJECTIVE IMPAIRMENTS: decreased activity tolerance, difficulty walking, decreased strength, impaired perceived functional ability, and pain.   ACTIVITY LIMITATIONS: stairs and locomotion level  PARTICIPATION LIMITATIONS: cleaning, shopping, and community activity  PERSONAL FACTORS: 1 comorbidity: history of knee OA  are also affecting patient's functional outcome.   REHAB POTENTIAL: Good  CLINICAL DECISION MAKING: Stable/uncomplicated  EVALUATION COMPLEXITY: Low   GOALS: Goals reviewed with patient? Yes  SHORT TERM GOALS: Target date: 12/19/2022   The patient  will demonstrate knowledge of basic self care strategies and exercises to promote healing   Baseline: Goal status: INITIAL  2.  The patient will report a 30% improvement in pain levels with functional activities which are currently difficult including walking and negotiating higher steps Baseline:  Goal status: INITIAL  3.  The patient will have improved hip and knee strength to at least 4/5 needed for standing, walking longer distances and descending stairs at home and in the community  Baseline:  Goal status: INITIAL  4.  The patient will be able to ambulate 6 min/600 feet  Baseline:  Goal status: INITIAL     LONG TERM GOALS: Target date: 01/16/2023   The patient will be independent in a safe self progression of a home exercise program to promote further recovery of function  Baseline:  Goal status: INITIAL  2.  The patient will report  a 70% improvement in pain levels with functional activities which are currently difficult including  Baseline:  Goal status: INITIAL  3.  The patient will have improved hip and knee strength to at least 4+/5 needed for standing, walking longer distances and descending stairs at home and in the community  Baseline:  Goal status: INITIAL  4.  The patient will have improved gait stamina and strength to ambulate around the block Baseline:  Goal status: INITIAL  5.  LEFS score improved to   66/80 indicating improved function with less pain Baseline:  Goal status: INITIAL   PLAN:  PT FREQUENCY: 2x/week  PT DURATION: 8 weeks  PLANNED INTERVENTIONS: Therapeutic exercises, Therapeutic activity, Neuromuscular re-education, Balance training, Gait training, Patient/Family education, Self Care, Joint mobilization, Aquatic Therapy, Dry Needling, Cryotherapy, Moist heat, Taping, Manual therapy, and Re-evaluation  PLAN FOR NEXT SESSION: lower quarter strengthening particularly quads and gluteals; progress HEP; advice on avoiding "overstretching"  Ruben Im, PT 12/04/22 11:56 PM Phone: [phone] Fax: [phone]

## 2022-12-06 ENCOUNTER — Telehealth: Payer: Self-pay | Admitting: Internal Medicine

## 2022-12-06 NOTE — Telephone Encounter (Signed)
Hi Dr. Hilarie Fredrickson   Patient called requesting a transfer of care specifically over to you due to relocating to [facility]. She has GI history from [facility] and is due for a colonoscopy. Records are available in EPIC as well as paper records that I will be sending up for you to review. Please advise on scheduling.  Thank you

## 2022-12-09 DIAGNOSIS — F411 Generalized anxiety disorder: Secondary | ICD-10-CM | POA: Diagnosis not present

## 2022-12-12 ENCOUNTER — Ambulatory Visit: Payer: Medicare (Managed Care) | Admitting: Physical Therapy

## 2022-12-12 DIAGNOSIS — M79604 Pain in right leg: Secondary | ICD-10-CM

## 2022-12-12 DIAGNOSIS — M6281 Muscle weakness (generalized): Secondary | ICD-10-CM | POA: Diagnosis not present

## 2022-12-12 NOTE — Therapy (Signed)
OUTPATIENT PHYSICAL THERAPY LOWER EXTREMITY PROGRESS  Patient Name: Mercedes Thompson MRN: [id number] DOB:05-10-1946, 77 y.o., female Today's Date: 12/12/2022  END OF SESSION:  PT End of Session - 12/12/22 0848     Visit Number 4    Date for PT Re-Evaluation 01/16/23    Authorization Type [employer]    PT Start Time 0845    PT Stop Time 0923    PT Time Calculation (min) 38 min    Activity Tolerance Patient tolerated treatment well             Past Medical History:  Diagnosis Date   AML (acute myeloblastic leukemia) ([place]) 1987   Anxiety    Appendicitis with peritoneal abscess 03/2015   s/p ex lap at [facility]   Atrial flutter [facility]) 2016   s/p cardioversion 02/2015, previously in [place], recently taken off amiodarone   Bradycardia 2015   s/p PPM insertion   H/O mitral valve replacement with mechanical valve    coumadin   Past Surgical History:  Procedure Laterality Date   APPENDECTOMY  03/24/2015   [facility] Dr Radene Knee.  perforated appendicitis   heart ablation     MITRAL VALVE REPLACEMENT  1986   PACEMAKER INSERTION     TUBAL LIGATION     Patient Active Problem List   Diagnosis Date Noted   Appendicitis with perforation s/p lap appy 03/22/2015 10/29/2015   Intra-abdominal abscess post-lap appy July 2016 10/29/2015   Intra-abdominal abscess ([place]) 10/29/2015   Abscess of abdominal cavity ([place]) 10/29/2015   Artificial cardiac pacemaker 05/17/2015   Sinoatrial node dysfunction ([place]) 05/17/2015   Long term current use of anticoagulant 03/22/2015   Encounter for therapeutic drug level monitoring 03/07/2015   Female genuine stress incontinence 03/25/2013   Anxiety disorder 08/14/2011   Dyslipidemia 08/13/2011   Atrial fibrillation ([place]) 06/11/2011   H/O mitral valve replacement with mechanical valve 06/11/2011    PCP: Maurice Small MD  REFERRING PROVIDER: Sharyon Medicus PA-C  REFERRING DIAG: [phone], M79.605 bil leg pain  THERAPY DIAG: bil leg pain;  weakness   Rationale for Evaluation and Treatment: Rehabilitation  ONSET DATE: Jan 2024  SUBJECTIVE:   SUBJECTIVE STATEMENT: Marching at [employer] chair yoga makes my legs burn so much.  I can hardly move the next day.    PERTINENT HISTORY: Pacemaker HR 60-120 Right knee OA Wears orthotics  PAIN:  PAIN:  Are you having pain? Yes NPRS scale: 4/10 Pain location: left > right anterior thigh Aggravating factors: walk, stiff in AM; stepping up on higher step Relieving factors: sitting  PRECAUTIONS: ICD/Pacemaker  WEIGHT BEARING RESTRICTIONS: No  FALLS:  Has patient fallen in last 6 months? No  LIVING ENVIRONMENT: Lives with: lives with their spouse Lives in: House/apartment Stairs: Yes: External: 3 steps; can reach both OCCUPATION: retired  PLOF: Independent  PATIENT GOALS: be more flexible; walk without pain (can't walk the block); get stronger    OBJECTIVE:   DIAGNOSTIC FINDINGS: per patient x-rays "very little knee OA"  PATIENT SURVEYS:  Lower Extremity Functional Scale LEFS:  56/80  COGNITION: Overall cognitive status: Within functional limits for tasks assessed      POSTURE: valgus knee right    LOWER EXTREMITY ROM:  right and left knee flexion 130 degrees; knee extension +3 bil ;  standing flexion fingertips to floor  LOWER EXTREMITY MMT:  right > left pelvic drop with SLS 5 sec on both sides  MMT Right eval Left eval  Hip flexion 5 5  Hip  extension    Hip abduction 4- 4-  Hip adduction    Hip internal rotation    Hip external rotation    Knee flexion    Knee extension 4- 4-  Ankle dorsiflexion    Ankle plantarflexion    Ankle inversion    Ankle eversion     (Blank rows = not tested)  LOWER EXTREMITY SPECIAL TESTS:  LE discomfort produced with seated neural tension especially adding in ankle DF  FUNCTIONAL TESTS:  5x sit to stand:  12.48 no UE assist TUG 8.21 no UE assist   GAIT:  Comments: dec stride length, increased  adduction; mild Trendelenburg;  stairs reciprocally bil handrails used  TODAY'S TREATMENT:     DATE: 3/28 Nu-Step L1 5 min while discussing [employer] response to ex; discussed limiting marching to the first and last ex's rather than the full 45 minutes to avoid extreme muscle soreness 2 5# dumbbells standard dead lift 8x,  staggered stance dead lifts 5x right/left 6 inch step ups 5x right/left single arm support, 2nd set holding 1 5# weight Hip machine 40#: abduction, extension right/left 10x Squats  in front of 20 inch box holding 5# 5x; 2nd set of 5 with overhead press Heel raises 3 sets of 5  Standing HS curl no weight 2 sets of 5 right/left Leg press seat 7 slight recline 80# bil 20x; 40# 20x right/left                                                                                                                     DATE: 3/20 Nu-Step L7 5 min Seated clam with yellow loop x 20 Seated hamstring stretch x 3 each side holding 30 sec Seated nerve floss 10x right/left Supine nerve floss 10x right/left Sit to stand from tall table (no knee pain at all) 10x Squats 1/3 in front of tall table 10x Leg press 75# bil 20x; 40# 20x right/left 6 inch step ups 10x right/left single arm support Hip machine 40#: abduction, extension, flexion 20x each right/left  Reviewed supine hamstring, IT band and hip IR stretches to avoid "overstretching" Reviewed seated piriformis stretch.  DATE: 3/12 Nu-Step L2 5 min Seated nerve floss 10x right/left Supine nerve floss 10x right/left Sit to stand from tall table (no knee pain at all) 10x Squats 1/3 in front of tall table 10x 6 inch step ups 10x right/left single arm support Leg press 75# bil 20x; 40# 15x right/left Hip machine 40#: abduction, extension, flexion 10x each right/left  RPE 5/10 96%  O2 , HR 82 Discussed appropriate equipment when she goes to [employer] on stretch strap and hold on "overstretching"   PATIENT EDUCATION:   Education details: Educated patient on anatomy and physiology of current symptoms, prognosis, plan of care as well as initial self care strategies to promote recovery  Person educated: Patient Education method: Explanation Education comprehension: verbalized understanding  HOME EXERCISE PROGRAM: Access Code: [email or web address] URL: [email or web address] Date: 12/12/2022 Prepared by: Ruben Im  Exercises -  Seated Long Arc Quad  - 1 x daily - 7 x weekly - 3 sets - 10 reps - Seated Hip Abduction with Resistance  - 1 x daily - 7 x weekly - 2 sets - 10 reps - Supine ITB Stretch with Strap  - 1 x daily - 7 x weekly - 1 sets - 3 reps - 30 sec hold - Supine Figure 4 Piriformis Stretch  - 1 x daily - 7 x weekly - 1 sets - 3 reps - 30 hold - Clamshell  - 1 x daily - 7 x weekly - 1 sets - 10 reps - Side Stepping with Resistance at Thighs and Counter Support  - 1 x daily - 7 x weekly - 1 sets - 10 reps - Forward Step Down  - 1 x daily - 7 x weekly - 1 sets - 10 reps - Step Taps on High Step  - 1 x daily - 7 x weekly - 1 sets - 10 reps - Single Leg 3 Way Reach with Support  - 1 x daily - 7 x weekly - 1 sets - 10 reps - Standing Hamstring Stretch with Step  - 1 x daily - 7 x weekly - 1 sets - 10 reps - Seated March with Ankle Weights at Foot  - 1 x daily - 7 x weekly - 3 sets - 10 reps - Single Leg Knee Extension with Weight Machine  - 1 x daily - 7 x weekly - 3 sets - 10 reps - Single Leg Hamstring Curl with Weight Machine  - 1 x daily - 7 x weekly - 3 sets - 10 reps - Full Leg Press with Resistance Around Knees  - 1 x daily - 7 x weekly - 3 sets - 10 reps - Hip Adduction Machine  - 1 x daily - 7 x weekly - 3 sets - 10 reps - Hip Abduction Machine  - 1 x daily - 7 x weekly - 3 sets - 10 reps - Standing Cable Hip Extension  - 1 x daily - 7 x weekly - 3 sets - 10 reps - Standing Cable Hip Flexion  - 1 x daily - 7 x weekly - 3 sets - 10 reps - Standing Cable Hip Abduction  - 1 x daily - 7 x  weekly - 3 sets - 10 reps - Supine Sciatic Nerve Glide  - 1 x daily - 7 x weekly - 1 sets - 10 reps - Seated Slump Nerve Glide (Mirrored)  - 1 x daily - 7 x weekly - 1 sets - 10 reps - Forward Step Up  - 1 x daily - 7 x weekly - 1 sets - 10 reps - Quarter Squat with Table  - 1 x daily - 7 x weekly - 1 sets - 10 reps - Sit to Stand Without Arm Support  - 1 x daily - 7 x weekly - 1 sets - 10 reps March 2024: - Deadlift With Dumbbells  - 1 x daily - 7 x weekly - 3 sets - 5 reps - Supine Sciatic Nerve Glide  - 1 x daily - 7 x weekly - 1 sets - 10 reps - Seated Slump Nerve Glide (Mirrored)  - 1 x daily - 7 x weekly - 1 sets - 10 reps - Forward Step Up  - 1 x daily - 7 x weekly - 1 sets - 10 reps - Quarter Squat with Table  - 1 x daily -  7 x weekly - 1 sets - 10 reps - Sit to Stand Without Arm Support  - 1 x daily - 7 x weekly - 1 sets - 10 reps  ASSESSMENT:  CLINICAL IMPRESSION: Discussed focusing on strengthening over stretching principle.  Able to progress intensity of exercise with squats and deadlifts without symptom aggravation.  Verbal cues and demo of ex's to optimize targeted muscle activation.  The patient reports her thigh is not trembling as it does during and after [employer].    OBJECTIVE IMPAIRMENTS: decreased activity tolerance, difficulty walking, decreased strength, impaired perceived functional ability, and pain.   ACTIVITY LIMITATIONS: stairs and locomotion level  PARTICIPATION LIMITATIONS: cleaning, shopping, and community activity  PERSONAL FACTORS: 1 comorbidity: history of knee OA  are also affecting patient's functional outcome.   REHAB POTENTIAL: Good  CLINICAL DECISION MAKING: Stable/uncomplicated  EVALUATION COMPLEXITY: Low   GOALS: Goals reviewed with patient? Yes  SHORT TERM GOALS: Target date: 12/19/2022   The patient will demonstrate knowledge of basic self care strategies and exercises to promote healing   Baseline: Goal status: INITIAL  2.   The patient will report a 30% improvement in pain levels with functional activities which are currently difficult including walking and negotiating higher steps Baseline:  Goal status: INITIAL  3.  The patient will have improved hip and knee strength to at least 4/5 needed for standing, walking longer distances and descending stairs at home and in the community  Baseline:  Goal status: INITIAL  4.  The patient will be able to ambulate 6 min/600 feet  Baseline:  Goal status: INITIAL     LONG TERM GOALS: Target date: 01/16/2023   The patient will be independent in a safe self progression of a home exercise program to promote further recovery of function  Baseline:  Goal status: INITIAL  2.  The patient will report a 70% improvement in pain levels with functional activities which are currently difficult including  Baseline:  Goal status: INITIAL  3.  The patient will have improved hip and knee strength to at least 4+/5 needed for standing, walking longer distances and descending stairs at home and in the community  Baseline:  Goal status: INITIAL  4.  The patient will have improved gait stamina and strength to ambulate around the block Baseline:  Goal status: INITIAL  5.  LEFS score improved to   66/80 indicating improved function with less pain Baseline:  Goal status: INITIAL   PLAN:  PT FREQUENCY: 2x/week  PT DURATION: 8 weeks  PLANNED INTERVENTIONS: Therapeutic exercises, Therapeutic activity, Neuromuscular re-education, Balance training, Gait training, Patient/Family education, Self Care, Joint mobilization, Aquatic Therapy, Dry Needling, Cryotherapy, Moist heat, Taping, Manual therapy, and Re-evaluation  PLAN FOR NEXT SESSION: check STGs; add kettlebell to dead lifts and squats; lower quarter strengthening particularly quads and gluteals; progress HEP; advice on avoiding "overstretching"  Ruben Im, PT 12/12/22 10:23 AM Phone: [phone] Fax:  [phone]  [place] [address], [place] [address] [place],  [address] Phone # [phone] Fax [phone]

## 2022-12-17 ENCOUNTER — Ambulatory Visit: Payer: Medicare (Managed Care) | Attending: Physician Assistant | Admitting: Physical Therapy

## 2022-12-17 DIAGNOSIS — M6281 Muscle weakness (generalized): Secondary | ICD-10-CM | POA: Insufficient documentation

## 2022-12-17 DIAGNOSIS — M79605 Pain in left leg: Secondary | ICD-10-CM | POA: Insufficient documentation

## 2022-12-17 DIAGNOSIS — M79604 Pain in right leg: Secondary | ICD-10-CM | POA: Diagnosis not present

## 2022-12-17 NOTE — Therapy (Signed)
OUTPATIENT PHYSICAL THERAPY LOWER EXTREMITY PROGRESS  Patient Name: Mercedes Thompson MRN: [id number] DOB:Jan 03, 1946, 77 y.o., female Today's Date: 12/17/2022  END OF SESSION:  PT End of Session - 12/17/22 1353     Visit Number 5    Date for PT Re-Evaluation 01/16/23    Authorization Type [employer]    PT Start Time 1400    PT Stop Time 1440    PT Time Calculation (min) 40 min    Activity Tolerance Patient tolerated treatment well             Past Medical History:  Diagnosis Date   AML (acute myeloblastic leukemia) ([place]) 1987   Anxiety    Appendicitis with peritoneal abscess 03/2015   s/p ex lap at [facility]   Atrial flutter [facility]) 2016   s/p cardioversion 02/2015, previously in [place], recently taken off amiodarone   Bradycardia 2015   s/p PPM insertion   H/O mitral valve replacement with mechanical valve    coumadin   Past Surgical History:  Procedure Laterality Date   APPENDECTOMY  03/24/2015   [facility] Dr Radene Knee.  perforated appendicitis   heart ablation     MITRAL VALVE REPLACEMENT  1986   PACEMAKER INSERTION     TUBAL LIGATION     Patient Active Problem List   Diagnosis Date Noted   Appendicitis with perforation s/p lap appy 03/22/2015 10/29/2015   Intra-abdominal abscess post-lap appy July 2016 10/29/2015   Intra-abdominal abscess 10/29/2015   Abscess of abdominal cavity 10/29/2015   Artificial cardiac pacemaker 05/17/2015   Sinoatrial node dysfunction 05/17/2015   Long term current use of anticoagulant 03/22/2015   Encounter for therapeutic drug level monitoring 03/07/2015   Female genuine stress incontinence 03/25/2013   Anxiety disorder 08/14/2011   Dyslipidemia 08/13/2011   Atrial fibrillation 06/11/2011   H/O mitral valve replacement with mechanical valve 06/11/2011    PCP: Maurice Small MD  REFERRING PROVIDER: Sharyon Medicus PA-C  REFERRING DIAG: [phone], M79.605 bil leg pain  THERAPY DIAG: bil leg pain; weakness   Rationale for Evaluation and  Treatment: Rehabilitation  ONSET DATE: Jan 2024  SUBJECTIVE:   SUBJECTIVE STATEMENT: I didn't do as much marching during [employer] class.  I walked at my grandmother[place] farm and my legs were really tired.  I just ache all over.  I did go off my cholesterol medication.     PERTINENT HISTORY: Pacemaker HR 60-120 Right knee OA Wears orthotics  PAIN:  PAIN:  Are you having pain? Yes NPRS scale: 4/10 Pain location: left > right anterior thigh Aggravating factors: walk, stiff in AM; stepping up on higher step Relieving factors: sitting  PRECAUTIONS: ICD/Pacemaker  WEIGHT BEARING RESTRICTIONS: No  FALLS:  Has patient fallen in last 6 months? No  LIVING ENVIRONMENT: Lives with: lives with their spouse Lives in: House/apartment Stairs: Yes: External: 3 steps; can reach both OCCUPATION: retired  PLOF: Independent  PATIENT GOALS: be more flexible; walk without pain (can't walk the block); get stronger    OBJECTIVE:   DIAGNOSTIC FINDINGS: per patient x-rays "very little knee OA"  PATIENT SURVEYS:  Lower Extremity Functional Scale LEFS:  56/80  COGNITION: Overall cognitive status: Within functional limits for tasks assessed      POSTURE: valgus knee right    LOWER EXTREMITY ROM:  right and left knee flexion 130 degrees; knee extension +3 bil ;  standing flexion fingertips to floor  LOWER EXTREMITY MMT:  right > left pelvic drop with SLS 5 sec on both  sides  MMT Right eval Left eval 4/2  Hip flexion 5 5   Hip extension     Hip abduction 4- 4- 4 bil  Hip adduction     Hip internal rotation     Hip external rotation     Knee flexion     Knee extension 4- 4- 4 bil  Ankle dorsiflexion     Ankle plantarflexion     Ankle inversion     Ankle eversion      (Blank rows = not tested)  LOWER EXTREMITY SPECIAL TESTS:  LE discomfort produced with seated neural tension especially adding in ankle DF  FUNCTIONAL TESTS:  5x sit to stand:  12.48 no UE assist TUG  8.21 no UE assist   GAIT:  Comments: dec stride length, increased adduction; mild Trendelenburg;  stairs reciprocally bil handrails used  TODAY'S TREATMENT:     DATE: 4/2 Nu-Step L3 5 min while discussing status  2 7# dumbbells standard dead lift 8x,  staggered stance dead lifts 5x right/left 6 inch step ups 5x right/left single arm support holding 1 7# weight Heel raise 2 sets of 5 with bicep curls 5# snatch and press overhead 10x right/left Hip machine 40#: abduction, extension, flexion right/left 10x Squats  in front of 20 inch box holding 10# 10x Bridge with head elevated on wedge 10# kettlebell resting on hips 10x Bridge with Les on ball 10x  HS curls with Les on green ball 10x2 Leg press seat 7 slight recline 90# bil 2x 10; 40# x 10 right/left then increased to 45# 10x right/left RPE 5 or 6/10 Therapeutic activities:  sit to stand, standing, walking, negotiating curbs, steps, lifting, pushing, pulling      DATE: 3/28 Nu-Step L1 5 min while discussing [employer] response to ex; discussed limiting marching to the first and last ex's rather than the full 45 minutes to avoid extreme muscle soreness 2 5# dumbbells standard dead lift 8x,  staggered stance dead lifts 5x right/left 6 inch step ups 5x right/left single arm support, 2nd set holding 1 5# weight Hip machine 40#: abduction, extension right/left 10x Squats  in front of 20 inch box holding 5# 5x; 2nd set of 5 with overhead press Heel raises 3 sets of 5  Standing HS curl no weight 2 sets of 5 right/left Leg press seat 7 slight recline 80# bil 20x; 40# 20x right/left                                                                                                                     DATE: 3/20 Nu-Step L7 5 min Seated clam with yellow loop x 20 Seated hamstring stretch x 3 each side holding 30 sec Seated nerve floss 10x right/left Supine nerve floss 10x right/left Sit to stand from tall table (no knee pain at all)  10x Squats 1/3 in front of tall table 10x Leg press 75# bil 20x; 40# 20x right/left 6 inch step ups 10x right/left single arm support Hip machine  40#: abduction, extension, flexion 20x each right/left  Reviewed supine hamstring, IT band and hip IR stretches to avoid "overstretching" Reviewed seated piriformis stretch.  PATIENT EDUCATION:  Education details: Educated patient on anatomy and physiology of current symptoms, prognosis, plan of care as well as initial self care strategies to promote recovery  Person educated: Patient Education method: Explanation Education comprehension: verbalized understanding  HOME EXERCISE PROGRAM: Access Code: [email or web address] URL: [email or web address] Date: 12/12/2022 Prepared by: Ruben Im  Exercises - Seated Long Arc Quad  - 1 x daily - 7 x weekly - 3 sets - 10 reps - Seated Hip Abduction with Resistance  - 1 x daily - 7 x weekly - 2 sets - 10 reps - Supine ITB Stretch with Strap  - 1 x daily - 7 x weekly - 1 sets - 3 reps - 30 sec hold - Supine Figure 4 Piriformis Stretch  - 1 x daily - 7 x weekly - 1 sets - 3 reps - 30 hold - Clamshell  - 1 x daily - 7 x weekly - 1 sets - 10 reps - Side Stepping with Resistance at Thighs and Counter Support  - 1 x daily - 7 x weekly - 1 sets - 10 reps - Forward Step Down  - 1 x daily - 7 x weekly - 1 sets - 10 reps - Step Taps on High Step  - 1 x daily - 7 x weekly - 1 sets - 10 reps - Single Leg 3 Way Reach with Support  - 1 x daily - 7 x weekly - 1 sets - 10 reps - Standing Hamstring Stretch with Step  - 1 x daily - 7 x weekly - 1 sets - 10 reps - Seated March with Ankle Weights at Foot  - 1 x daily - 7 x weekly - 3 sets - 10 reps - Single Leg Knee Extension with Weight Machine  - 1 x daily - 7 x weekly - 3 sets - 10 reps - Single Leg Hamstring Curl with Weight Machine  - 1 x daily - 7 x weekly - 3 sets - 10 reps - Full Leg Press with Resistance Around Knees  - 1 x daily - 7 x weekly - 3 sets - 10  reps - Hip Adduction Machine  - 1 x daily - 7 x weekly - 3 sets - 10 reps - Hip Abduction Machine  - 1 x daily - 7 x weekly - 3 sets - 10 reps - Standing Cable Hip Extension  - 1 x daily - 7 x weekly - 3 sets - 10 reps - Standing Cable Hip Flexion  - 1 x daily - 7 x weekly - 3 sets - 10 reps - Standing Cable Hip Abduction  - 1 x daily - 7 x weekly - 3 sets - 10 reps - Supine Sciatic Nerve Glide  - 1 x daily - 7 x weekly - 1 sets - 10 reps - Seated Slump Nerve Glide (Mirrored)  - 1 x daily - 7 x weekly - 1 sets - 10 reps - Forward Step Up  - 1 x daily - 7 x weekly - 1 sets - 10 reps - Quarter Squat with Table  - 1 x daily - 7 x weekly - 1 sets - 10 reps - Sit to Stand Without Arm Support  - 1 x daily - 7 x weekly - 1 sets - 10 reps March 2024: - Deadlift With Dumbbells  - 1 x  daily - 7 x weekly - 3 sets - 5 reps - Supine Sciatic Nerve Glide  - 1 x daily - 7 x weekly - 1 sets - 10 reps - Seated Slump Nerve Glide (Mirrored)  - 1 x daily - 7 x weekly - 1 sets - 10 reps - Forward Step Up  - 1 x daily - 7 x weekly - 1 sets - 10 reps - Quarter Squat with Table  - 1 x daily - 7 x weekly - 1 sets - 10 reps - Sit to Stand Without Arm Support  - 1 x daily - 7 x weekly - 1 sets - 10 reps - Supine Hamstring Curl on Swiss Ball  - 1 x daily - 7 x weekly - 1 sets - 10 reps - Supine Bridge with Pelvic Floor Contraction on Swiss Ball  - 1 x daily - 7 x weekly - 1 sets - 10 reps - Supine Bridge with Pelvic Floor Contraction  - 1 x daily - 7 x weekly - 1 sets - 10 reps  ASSESSMENT:  CLINICAL IMPRESSION: Patient able to progress weights/resistance in nearly all exercises today without difficulty or exacerbation of pain. She reports end of session that nothing was painful, just soft tissue pulling and notes she is able to rise much easier than when she came in.  She demonstrates good technique with dead lifts and squatting with minimal cues for wider stance only.  Good progress with STGS.     OBJECTIVE  IMPAIRMENTS: decreased activity tolerance, difficulty walking, decreased strength, impaired perceived functional ability, and pain.   ACTIVITY LIMITATIONS: stairs and locomotion level  PARTICIPATION LIMITATIONS: cleaning, shopping, and community activity  PERSONAL FACTORS: 1 comorbidity: history of knee OA  are also affecting patient's functional outcome.   REHAB POTENTIAL: Good  CLINICAL DECISION MAKING: Stable/uncomplicated  EVALUATION COMPLEXITY: Low   GOALS: Goals reviewed with patient? Yes  SHORT TERM GOALS: Target date: 12/19/2022   The patient will demonstrate knowledge of basic self care strategies and exercises to promote healing   Baseline: Goal status: met 4/2  2.  The patient will report a 30% improvement in pain levels with functional activities which are currently difficult including walking and negotiating higher steps Baseline:  Goal status: ongoing  3.  The patient will have improved hip and knee strength to at least 4/5 needed for standing, walking longer distances and descending stairs at home and in the community  Baseline:  Goal status: met 4/2  4.  The patient will be able to ambulate 6 min/600 feet  Baseline:  Goal status:  met 4/2     LONG TERM GOALS: Target date: 01/16/2023   The patient will be independent in a safe self progression of a home exercise program to promote further recovery of function  Baseline:  Goal status: INITIAL  2.  The patient will report a 70% improvement in pain levels with functional activities which are currently difficult including  Baseline:  Goal status: INITIAL  3.  The patient will have improved hip and knee strength to at least 4+/5 needed for standing, walking longer distances and descending stairs at home and in the community  Baseline:  Goal status: INITIAL  4.  The patient will have improved gait stamina and strength to ambulate around the block Baseline:  Goal status: INITIAL  5.  LEFS score improved  to   66/80 indicating improved function with less pain Baseline:  Goal status: INITIAL   PLAN:  PT FREQUENCY:  2x/week  PT DURATION: 8 weeks  PLANNED INTERVENTIONS: Therapeutic exercises, Therapeutic activity, Neuromuscular re-education, Balance training, Gait training, Patient/Family education, Self Care, Joint mobilization, Aquatic Therapy, Dry Needling, Cryotherapy, Moist heat, Taping, Manual therapy, and Re-evaluation  PLAN FOR NEXT SESSION:  kettlebell to dead lifts and squats; lower quarter strengthening particularly quads and gluteals; progress HEP; advice on avoiding "overstretching";  6 MWT  Ruben Im, PT 12/17/22 2:52 PM Phone: [phone] Fax: [phone]  [facility] [address], [address] [place], [place] [address] Phone # [phone] Fax [phone]

## 2022-12-19 ENCOUNTER — Encounter: Payer: Self-pay | Admitting: Internal Medicine

## 2022-12-23 DIAGNOSIS — G4733 Obstructive sleep apnea (adult) (pediatric): Secondary | ICD-10-CM | POA: Diagnosis not present

## 2022-12-23 DIAGNOSIS — F411 Generalized anxiety disorder: Secondary | ICD-10-CM | POA: Diagnosis not present

## 2022-12-24 ENCOUNTER — Ambulatory Visit: Payer: Medicare (Managed Care) | Admitting: Physical Therapy

## 2022-12-24 DIAGNOSIS — M79604 Pain in right leg: Secondary | ICD-10-CM | POA: Diagnosis not present

## 2022-12-24 DIAGNOSIS — Z7901 Long term (current) use of anticoagulants: Secondary | ICD-10-CM | POA: Diagnosis not present

## 2022-12-24 DIAGNOSIS — M6281 Muscle weakness (generalized): Secondary | ICD-10-CM

## 2022-12-24 NOTE — Therapy (Signed)
OUTPATIENT PHYSICAL THERAPY LOWER EXTREMITY PROGRESS  Patient Name: Mercedes Thompson MRN: [id number] DOB:Mar 14, 1946, 77 y.o., female Today's Date: 12/24/2022  END OF SESSION:  PT End of Session - 12/24/22 1402     Visit Number 6    Date for PT Re-Evaluation 01/16/23    Authorization Type [employer]    PT Start Time 1400    PT Stop Time 1440    PT Time Calculation (min) 40 min    Activity Tolerance Patient tolerated treatment well             Past Medical History:  Diagnosis Date   AML (acute myeloblastic leukemia) (HCC) 1987   Anxiety    Appendicitis with peritoneal abscess 03/2015   [facility] lap at [facility]   Atrial flutter [facility]) 2016   s/p cardioversion 02/2015, previously in Tikosyn, recently taken off amiodarone   Bradycardia 2015   s/p PPM insertion   H/O mitral valve replacement with mechanical valve    coumadin   Past Surgical History:  Procedure Laterality Date   APPENDECTOMY  03/24/2015   [facility] Dr Cherly Hensen.  perforated appendicitis   heart ablation     MITRAL VALVE REPLACEMENT  1986   PACEMAKER INSERTION     TUBAL LIGATION     Patient Active Problem List   Diagnosis Date Noted   Appendicitis with perforation s/p lap appy 03/22/2015 10/29/2015   Intra-abdominal abscess post-lap appy July 2016 10/29/2015   Intra-abdominal abscess 10/29/2015   Abscess of abdominal cavity 10/29/2015   Artificial cardiac pacemaker 05/17/2015   Sinoatrial node dysfunction 05/17/2015   Long term current use of anticoagulant 03/22/2015   Encounter for therapeutic drug level monitoring 03/07/2015   Female genuine stress incontinence 03/25/2013   Anxiety disorder 08/14/2011   Dyslipidemia 08/13/2011   Atrial fibrillation 06/11/2011   H/O mitral valve replacement with mechanical valve 06/11/2011    PCP: Shirlean Mylar MD  REFERRING PROVIDER: Raquel James PA-C  REFERRING DIAG: [phone], M79.605 bil leg pain  THERAPY DIAG: bil leg pain; weakness   Rationale for Evaluation and  Treatment: Rehabilitation  ONSET DATE: Jan 2024  SUBJECTIVE:   SUBJECTIVE STATEMENT:   I went off my cholesterol medication 2 weeks ago.  Up and down steps is a little easier.  Some discomfort right lower leg with walking.    PERTINENT HISTORY: Pacemaker HR 60-120 Right knee OA Wears orthotics  PAIN:  PAIN:  Are you having pain? Yes NPRS scale: 2/10 Pain location: left lower leg Aggravating factors: walk, stiff in AM; stepping up on higher step Relieving factors: sitting  PRECAUTIONS: ICD/Pacemaker  WEIGHT BEARING RESTRICTIONS: No  FALLS:  Has patient fallen in last 6 months? No  LIVING ENVIRONMENT: Lives with: lives with their spouse Lives in: House/apartment Stairs: Yes: External: 3 steps; can reach both OCCUPATION: retired  PLOF: Independent  PATIENT GOALS: be more flexible; walk without pain (can't walk the block); get stronger    OBJECTIVE:   DIAGNOSTIC FINDINGS: per patient x-rays "very little knee OA"  PATIENT SURVEYS:  Lower Extremity Functional Scale LEFS:  56/80  COGNITION: Overall cognitive status: Within functional limits for tasks assessed      POSTURE: valgus knee right    LOWER EXTREMITY ROM:  right and left knee flexion 130 degrees; knee extension +3 bil ;  standing flexion fingertips to floor  LOWER EXTREMITY MMT:  right > left pelvic drop with SLS 5 sec on both sides  MMT Right eval Left eval 4/2  Hip flexion 5  5   Hip extension     Hip abduction 4- 4- 4 bil  Hip adduction     Hip internal rotation     Hip external rotation     Knee flexion     Knee extension 4- 4- 4 bil  Ankle dorsiflexion     Ankle plantarflexion     Ankle inversion     Ankle eversion      (Blank rows = not tested)  LOWER EXTREMITY SPECIAL TESTS:  LE discomfort produced with seated neural tension especially adding in ankle DF  FUNCTIONAL TESTS:  5x sit to stand:  12.48 no UE assist TUG 8.21 no UE assist   GAIT:  Comments: dec stride length,  increased adduction; mild Trendelenburg;  stairs reciprocally bil handrails used  TODAY'S TREATMENT:     DATE: 4/9 Nu-Step L1 5 min while discussing status  15#  dead lift 2 sets of 5 15# goblet squats in front of 20 inch box 2 sets of 5 Red loop above knees hip 3 ways 10x right/left 6 inch step ups 5x right/left single arm support holding 1 7# weight Heel raise 2 sets of 10x Toe raises 10x Back lunges 5x 2 sets alternating right/left Bridge with red power loop across hips 10x Bridge with Les on ball 5x  HS curls with Les on green ball 5x Modified plank: elbows/knees 8x 5 sec hold Hip machine 40#: abduction, extension, flexion right/left 10x  (I can feel that standing on the right leg) RPE 5 or 6/10 Therapeutic activities:  sit to stand, standing, walking, negotiating curbs, steps, lifting, pushing, pulling     DATE: 4/2 Nu-Step L3 5 min while discussing status  2 7# dumbbells standard dead lift 8x,  staggered stance dead lifts 5x right/left 6 inch step ups 5x right/left single arm support holding 1 7# weight Heel raise 2 sets of 5 with bicep curls 5# snatch and press overhead 10x right/left Hip machine 40#: abduction, extension, flexion right/left 10x Squats  in front of 20 inch box holding 10# 10x Bridge with head elevated on wedge 10# kettlebell resting on hips 10x Bridge with Les on ball 10x  HS curls with Les on green ball 10x2 Leg press seat 7 slight recline 90# bil 2x 10; 40# x 10 right/left then increased to 45# 10x right/left RPE 5 or 6/10 Therapeutic activities:  sit to stand, standing, walking, negotiating curbs, steps, lifting, pushing, pulling      DATE: 3/28 Nu-Step L1 5 min while discussing [employer]ne[employer] response to ex; discussed limiting marching to the first and last ex's rather than the full 45 minutes to avoid extreme muscle soreness 2 5# dumbbells standard dead lift 8x,  staggered stance dead lifts 5x right/left 6 inch step ups 5x right/left single arm  support, 2nd set holding 1 5# weight Hip machine 40#: abduction, extension right/left 10x Squats  in front of 20 inch box holding 5# 5x; 2nd set of 5 with overhead press Heel raises 3 sets of 5  Standing HS curl no weight 2 sets of 5 right/left Leg press seat 7 slight recline 80# bil 20x; 40# 20x right/left               PATIENT EDUCATION:  Education details: Educated patient on anatomy and physiology of current symptoms, prognosis, plan of care as well as initial self care strategies to promote recovery  Person educated: Patient Education method: Explanation Education comprehension: verbalized understanding  HOME EXERCISE PROGRAM: Access Code: [email or web address] URL:  [email or web address] Date: 12/12/2022 Prepared by: Lavinia Sharps  Exercises - Seated Long Arc Quad  - 1 x daily - 7 x weekly - 3 sets - 10 reps - Seated Hip Abduction with Resistance  - 1 x daily - 7 x weekly - 2 sets - 10 reps - Supine ITB Stretch with Strap  - 1 x daily - 7 x weekly - 1 sets - 3 reps - 30 sec hold - Supine Figure 4 Piriformis Stretch  - 1 x daily - 7 x weekly - 1 sets - 3 reps - 30 hold - Clamshell  - 1 x daily - 7 x weekly - 1 sets - 10 reps - Side Stepping with Resistance at Thighs and Counter Support  - 1 x daily - 7 x weekly - 1 sets - 10 reps - Forward Step Down  - 1 x daily - 7 x weekly - 1 sets - 10 reps - Step Taps on High Step  - 1 x daily - 7 x weekly - 1 sets - 10 reps - Single Leg 3 Way Reach with Support  - 1 x daily - 7 x weekly - 1 sets - 10 reps - Standing Hamstring Stretch with Step  - 1 x daily - 7 x weekly - 1 sets - 10 reps - Seated March with Ankle Weights at Foot  - 1 x daily - 7 x weekly - 3 sets - 10 reps - Single Leg Knee Extension with Weight Machine  - 1 x daily - 7 x weekly - 3 sets - 10 reps - Single Leg Hamstring Curl with Weight Machine  - 1 x daily - 7 x weekly - 3 sets - 10 reps - Full Leg Press with Resistance Around Knees  - 1 x daily - 7 x weekly - 3 sets - 10  reps - Hip Adduction Machine  - 1 x daily - 7 x weekly - 3 sets - 10 reps - Hip Abduction Machine  - 1 x daily - 7 x weekly - 3 sets - 10 reps - Standing Cable Hip Extension  - 1 x daily - 7 x weekly - 3 sets - 10 reps - Standing Cable Hip Flexion  - 1 x daily - 7 x weekly - 3 sets - 10 reps - Standing Cable Hip Abduction  - 1 x daily - 7 x weekly - 3 sets - 10 reps - Supine Sciatic Nerve Glide  - 1 x daily - 7 x weekly - 1 sets - 10 reps - Seated Slump Nerve Glide (Mirrored)  - 1 x daily - 7 x weekly - 1 sets - 10 reps - Forward Step Up  - 1 x daily - 7 x weekly - 1 sets - 10 reps - Quarter Squat with Table  - 1 x daily - 7 x weekly - 1 sets - 10 reps - Sit to Stand Without Arm Support  - 1 x daily - 7 x weekly - 1 sets - 10 reps March 2024: - Deadlift With Dumbbells  - 1 x daily - 7 x weekly - 3 sets - 5 reps - Supine Sciatic Nerve Glide  - 1 x daily - 7 x weekly - 1 sets - 10 reps - Seated Slump Nerve Glide (Mirrored)  - 1 x daily - 7 x weekly - 1 sets - 10 reps - Forward Step Up  - 1 x daily - 7 x weekly - 1 sets - 10 reps -  Quarter Squat with Table  - 1 x daily - 7 x weekly - 1 sets - 10 reps - Sit to Stand Without Arm Support  - 1 x daily - 7 x weekly - 1 sets - 10 reps - Supine Hamstring Curl on Swiss Ball  - 1 x daily - 7 x weekly - 1 sets - 10 reps - Supine Bridge with Pelvic Floor Contraction on Swiss Ball  - 1 x daily - 7 x weekly - 1 sets - 10 reps - Supine Bridge with Pelvic Floor Contraction  - 1 x daily - 7 x weekly - 1 sets - 10 reps - Plank on Knees  - 1 x daily - 7 x weekly - 1 sets - 8 reps - 5 hold  ASSESSMENT:  CLINICAL IMPRESSION: Able to increase load with dead lifts and squats with a positive response. Minimal verbal cues needed.   Noted improved hip ROM in all planes but particularly hip abduction and extension.  Some discomfort with single limb stance but patient feels it is tolerable.  On track to meet LTGs.    OBJECTIVE IMPAIRMENTS: decreased activity  tolerance, difficulty walking, decreased strength, impaired perceived functional ability, and pain.   ACTIVITY LIMITATIONS: stairs and locomotion level  PARTICIPATION LIMITATIONS: cleaning, shopping, and community activity  PERSONAL FACTORS: 1 comorbidity: history of knee OA  are also affecting patient's functional outcome.   REHAB POTENTIAL: Good  CLINICAL DECISION MAKING: Stable/uncomplicated  EVALUATION COMPLEXITY: Low   GOALS: Goals reviewed with patient? Yes  SHORT TERM GOALS: Target date: 12/19/2022   The patient will demonstrate knowledge of basic self care strategies and exercises to promote healing   Baseline: Goal status: met 4/2  2.  The patient will report a 30% improvement in pain levels with functional activities which are currently difficult including walking and negotiating higher steps Baseline:  Goal status: ongoing  3.  The patient will have improved hip and knee strength to at least 4/5 needed for standing, walking longer distances and descending stairs at home and in the community  Baseline:  Goal status: met 4/2  4.  The patient will be able to ambulate 6 min/600 feet  Baseline:  Goal status:  met 4/2     LONG TERM GOALS: Target date: 01/16/2023   The patient will be independent in a safe self progression of a home exercise program to promote further recovery of function  Baseline:  Goal status: INITIAL  2.  The patient will report a 70% improvement in pain levels with functional activities which are currently difficult including  Baseline:  Goal status: INITIAL  3.  The patient will have improved hip and knee strength to at least 4+/5 needed for standing, walking longer distances and descending stairs at home and in the community  Baseline:  Goal status: INITIAL  4.  The patient will have improved gait stamina and strength to ambulate around the block Baseline:  Goal status: INITIAL  5.  LEFS score improved to   66/80 indicating improved  function with less pain Baseline:  Goal status: INITIAL   PLAN:  PT FREQUENCY: 2x/week  PT DURATION: 8 weeks  PLANNED INTERVENTIONS: Therapeutic exercises, Therapeutic activity, Neuromuscular re-education, Balance training, Gait training, Patient/Family education, Self Care, Joint mobilization, Aquatic Therapy, Dry Needling, Cryotherapy, Moist heat, Taping, Manual therapy, and Re-evaluation  PLAN FOR NEXT SESSION:  kettlebell to dead lifts and squats; lower quarter strengthening particularly quads and gluteals; progress HEP; advice on avoiding "overstretching"  Lavinia Sharps,  PT 12/24/22 5:16 PM Phone: [phone] Fax: [phone]  [facility] [address], [address] [place], [place] [address] Phone # [phone] Fax [phone]

## 2022-12-25 DIAGNOSIS — G4700 Insomnia, unspecified: Secondary | ICD-10-CM | POA: Diagnosis not present

## 2022-12-25 DIAGNOSIS — G4733 Obstructive sleep apnea (adult) (pediatric): Secondary | ICD-10-CM | POA: Diagnosis not present

## 2022-12-25 DIAGNOSIS — F330 Major depressive disorder, recurrent, mild: Secondary | ICD-10-CM | POA: Diagnosis not present

## 2022-12-27 DIAGNOSIS — I480 Paroxysmal atrial fibrillation: Secondary | ICD-10-CM | POA: Diagnosis not present

## 2022-12-29 DIAGNOSIS — I499 Cardiac arrhythmia, unspecified: Secondary | ICD-10-CM | POA: Diagnosis not present

## 2022-12-29 DIAGNOSIS — I444 Left anterior fascicular block: Secondary | ICD-10-CM | POA: Diagnosis not present

## 2023-01-01 DIAGNOSIS — I082 Rheumatic disorders of both aortic and tricuspid valves: Secondary | ICD-10-CM | POA: Diagnosis not present

## 2023-01-01 DIAGNOSIS — I272 Pulmonary hypertension, unspecified: Secondary | ICD-10-CM | POA: Diagnosis not present

## 2023-01-01 DIAGNOSIS — Z7901 Long term (current) use of anticoagulants: Secondary | ICD-10-CM | POA: Diagnosis not present

## 2023-01-02 ENCOUNTER — Ambulatory Visit: Payer: Medicare (Managed Care) | Admitting: Physical Therapy

## 2023-01-02 DIAGNOSIS — M79604 Pain in right leg: Secondary | ICD-10-CM | POA: Diagnosis not present

## 2023-01-02 DIAGNOSIS — M6281 Muscle weakness (generalized): Secondary | ICD-10-CM

## 2023-01-02 DIAGNOSIS — E785 Hyperlipidemia, unspecified: Secondary | ICD-10-CM | POA: Diagnosis not present

## 2023-01-02 DIAGNOSIS — G4733 Obstructive sleep apnea (adult) (pediatric): Secondary | ICD-10-CM | POA: Diagnosis not present

## 2023-01-02 DIAGNOSIS — R7303 Prediabetes: Secondary | ICD-10-CM | POA: Diagnosis not present

## 2023-01-02 DIAGNOSIS — Z6835 Body mass index (BMI) 35.0-35.9, adult: Secondary | ICD-10-CM | POA: Diagnosis not present

## 2023-01-02 DIAGNOSIS — G4700 Insomnia, unspecified: Secondary | ICD-10-CM | POA: Diagnosis not present

## 2023-01-02 NOTE — Therapy (Signed)
OUTPATIENT PHYSICAL THERAPY LOWER EXTREMITY PROGRESS  Patient Name: Mercedes Thompson MRN: [id number] DOB:April 23, 1946, 77 y.o., female Today's Date: 01/02/2023  END OF SESSION:  PT End of Session - 01/02/23 1402     Visit Number 7    Date for PT Re-Evaluation 01/16/23    Authorization Type [employer]    PT Start Time 1400    PT Stop Time 1440    PT Time Calculation (min) 40 min    Activity Tolerance Patient tolerated treatment well             Past Medical History:  Diagnosis Date   AML (acute myeloblastic leukemia) (HCC) 1987   Anxiety    Appendicitis with peritoneal abscess 03/2015   s/p ex lap at [facility]   Atrial flutter [facility]) 2016   s/p cardioversion 02/2015, previously in Tikosyn, recently taken off amiodarone   Bradycardia 2015   s/p PPM insertion   H/O mitral valve replacement with mechanical valve    coumadin   Past Surgical History:  Procedure Laterality Date   APPENDECTOMY  03/24/2015   [facility] Dr Cherly Hensen.  perforated appendicitis   heart ablation     MITRAL VALVE REPLACEMENT  1986   PACEMAKER INSERTION     TUBAL LIGATION     Patient Active Problem List   Diagnosis Date Noted   Appendicitis with perforation s/p lap appy 03/22/2015 10/29/2015   Intra-abdominal abscess post-lap appy July 2016 10/29/2015   Intra-abdominal abscess 10/29/2015   Abscess of abdominal cavity 10/29/2015   Artificial cardiac pacemaker 05/17/2015   Sinoatrial node dysfunction 05/17/2015   Long term current use of anticoagulant 03/22/2015   Encounter for therapeutic drug level monitoring 03/07/2015   Female genuine stress incontinence 03/25/2013   Anxiety disorder 08/14/2011   Dyslipidemia 08/13/2011   Atrial fibrillation 06/11/2011   H/O mitral valve replacement with mechanical valve 06/11/2011    PCP: Shirlean Mylar MD  REFERRING PROVIDER: Raquel James PA-C  REFERRING DIAG: [phone], M79.605 bil leg pain  THERAPY DIAG: bil leg pain; weakness   Rationale for Evaluation and  Treatment: Rehabilitation  ONSET DATE: Jan 2024  SUBJECTIVE:   SUBJECTIVE STATEMENT: My back has been hurting since I was here achey no sharp pains.  I think it's from lifting that kettlebell.  I haven't been able to bend down and pick things up.     PERTINENT HISTORY: Pacemaker HR 60-120 Right knee OA Wears orthotics  PAIN:  PAIN:  Are you having pain? Yes NPRS scale: 7/10 Pain location: bil lower leg pain Aggravating factors: walk, stiff in AM; stepping up on higher step Relieving factors: sitting  PRECAUTIONS: ICD/Pacemaker  WEIGHT BEARING RESTRICTIONS: No  FALLS:  Has patient fallen in last 6 months? No  LIVING ENVIRONMENT: Lives with: lives with their spouse Lives in: House/apartment Stairs: Yes: External: 3 steps; can reach both OCCUPATION: retired  PLOF: Independent  PATIENT GOALS: be more flexible; walk without pain (can't walk the block); get stronger    OBJECTIVE:   DIAGNOSTIC FINDINGS: per patient x-rays "very little knee OA"  PATIENT SURVEYS:  Lower Extremity Functional Scale LEFS:  56/80  COGNITION: Overall cognitive status: Within functional limits for tasks assessed      POSTURE: valgus knee right    LOWER EXTREMITY ROM:  right and left knee flexion 130 degrees; knee extension +3 bil ;  standing flexion fingertips to floor  LOWER EXTREMITY MMT:  right > left pelvic drop with SLS 5 sec on both sides  MMT Right eval  Left eval 4/2  Hip flexion 5 5   Hip extension     Hip abduction 4- 4- 4 bil  Hip adduction     Hip internal rotation     Hip external rotation     Knee flexion     Knee extension 4- 4- 4 bil  Ankle dorsiflexion     Ankle plantarflexion     Ankle inversion     Ankle eversion      (Blank rows = not tested)  LOWER EXTREMITY SPECIAL TESTS:  LE discomfort produced with seated neural tension especially adding in ankle DF  FUNCTIONAL TESTS:  5x sit to stand:  12.48 no UE assist TUG 8.21 no UE  assist   GAIT:  Comments: dec stride length, increased adduction; mild Trendelenburg;  stairs reciprocally bil handrails used  TODAY'S TREATMENT:     DATE: 4/18 Nu-Step L1 5 min while discussing status  Squats no weight in front of 20 inch box 2 sets of 5 Wall pull aways 10x (anterior tibialis) Red loop above knees hip 3 ways 10x right/left (add padding next time for comfort) 6 inch step ups 10x right/left single arm support no weight Seated red power loop HS curls with padding at the ankle 2x10 right/left Hip machine 40#: abduction, extension, flexion right/left 10x   Seated back stretch 3 ways with blue ball 2 minutes Open books 10x right/left side Leg press seat 7 slight recline 90# bil 15x;  single leg 45# 10x right/left Therapeutic activities:  sit to stand, standing, walking, negotiating curbs, steps, lifting, pushing, pulling      DATE: 4/9 Nu-Step L1 5 min while discussing status  15#  dead lift 2 sets of 5 15# goblet squats in front of 20 inch box 2 sets of 5 Red loop above knees hip 3 ways 10x right/left 6 inch step ups 5x right/left single arm support holding 1 7# weight Heel raise 2 sets of 10x Toe raises 10x Back lunges 5x 2 sets alternating right/left Bridge with red power loop across hips 10x Bridge with Les on ball 5x  HS curls with Les on green ball 5x Modified plank: elbows/knees 8x 5 sec hold Hip machine 40#: abduction, extension, flexion right/left 10x  (I can feel that standing on the right leg) RPE 5 or 6/10 Therapeutic activities:  sit to stand, standing, walking, negotiating curbs, steps, lifting, pushing, pulling     DATE: 4/2 Nu-Step L3 5 min while discussing status  2 7# dumbbells standard dead lift 8x,  staggered stance dead lifts 5x right/left 6 inch step ups 5x right/left single arm support holding 1 7# weight Heel raise 2 sets of 5 with bicep curls 5# snatch and press overhead 10x right/left Hip machine 40#: abduction, extension, flexion  right/left 10x Squats  in front of 20 inch box holding 10# 10x Bridge with head elevated on wedge 10# kettlebell resting on hips 10x Bridge with Les on ball 10x  HS curls with Les on green ball 10x2 Leg press seat 7 slight recline 90# bil 2x 10; 40# x 10 right/left then increased to 45# 10x right/left RPE 5 or 6/10 Therapeutic activities:  sit to stand, standing, walking, negotiating curbs, steps, lifting, pushing, pulling                  PATIENT EDUCATION:  Education details: Educated patient on anatomy and physiology of current symptoms, prognosis, plan of care as well as initial self care strategies to promote recovery  Person educated:  Patient Education method: Explanation Education comprehension: verbalized understanding  HOME EXERCISE PROGRAM: Access Code: [email or web address] URL: [email or web address] Date: 01/02/2023 Prepared by: Lavinia Sharps  Exercises - Seated Long Arc Quad  - 1 x daily - 7 x weekly - 3 sets - 10 reps - Seated Hip Abduction with Resistance  - 1 x daily - 7 x weekly - 2 sets - 10 reps - Supine ITB Stretch with Strap  - 1 x daily - 7 x weekly - 1 sets - 3 reps - 30 sec hold - Supine Figure 4 Piriformis Stretch  - 1 x daily - 7 x weekly - 1 sets - 3 reps - 30 hold - Clamshell  - 1 x daily - 7 x weekly - 1 sets - 10 reps - Side Stepping with Resistance at Thighs and Counter Support  - 1 x daily - 7 x weekly - 1 sets - 10 reps - Forward Step Down  - 1 x daily - 7 x weekly - 1 sets - 10 reps - Step Taps on High Step  - 1 x daily - 7 x weekly - 1 sets - 10 reps - Single Leg 3 Way Reach with Support  - 1 x daily - 7 x weekly - 1 sets - 10 reps - Standing Hamstring Stretch with Step  - 1 x daily - 7 x weekly - 1 sets - 10 reps - Seated March with Ankle Weights at Foot  - 1 x daily - 7 x weekly - 3 sets - 10 reps - Single Leg Knee Extension with Weight Machine  - 1 x daily - 7 x weekly - 3 sets - 10 reps - Single Leg Hamstring Curl with Weight Machine  - 1 x  daily - 7 x weekly - 3 sets - 10 reps - Full Leg Press with Resistance Around Knees  - 1 x daily - 7 x weekly - 3 sets - 10 reps - Hip Adduction Machine  - 1 x daily - 7 x weekly - 3 sets - 10 reps - Hip Abduction Machine  - 1 x daily - 7 x weekly - 3 sets - 10 reps - Standing Cable Hip Extension  - 1 x daily - 7 x weekly - 3 sets - 10 reps - Standing Cable Hip Flexion  - 1 x daily - 7 x weekly - 3 sets - 10 reps - Standing Cable Hip Abduction  - 1 x daily - 7 x weekly - 3 sets - 10 reps - Supine Sciatic Nerve Glide  - 1 x daily - 7 x weekly - 1 sets - 10 reps - Seated Slump Nerve Glide (Mirrored)  - 1 x daily - 7 x weekly - 1 sets - 10 reps - Forward Step Up  - 1 x daily - 7 x weekly - 1 sets - 10 reps - Quarter Squat with Table  - 1 x daily - 7 x weekly - 1 sets - 10 reps - Sit to Stand Without Arm Support  - 1 x daily - 7 x weekly - 1 sets - 10 reps - Deadlift With Dumbbells  - 1 x daily - 7 x weekly - 3 sets - 5 reps - Supine Hamstring Curl on Swiss Ball  - 1 x daily - 7 x weekly - 1 sets - 10 reps - Supine Bridge with Pelvic Floor Contraction on Swiss Ball  - 1 x daily - 7 x weekly - 1 sets - 10 reps -  Supine Bridge with Pelvic Floor Contraction  - 1 x daily - 7 x weekly - 1 sets - 10 reps - Plank on Knees  - 1 x daily - 7 x weekly - 1 sets - 8 reps - 5 hold - Sidelying Open Book Thoracic Rotation with Knee on Foam Roll  - 1 x daily - 7 x weekly - 1 sets - 10 reps - Seated Hamstring Curl with Anchored Resistance  - 1 x daily - 7 x weekly - 2 sets - 10 reps ASSESSMENT:  CLINICAL IMPRESSION: Modified treatment secondary to reports of back pain. Hold on dead lifting but able to perform squats and other ex's without increased complaint of pain.  Able to continue with strengthening of quads, HS and gluteals in particular to offload knee joint compression.  Verbal cues for wider squat stance for greater comfort.  Decreased back  pain at the end of session reported with request to add open  books  to HEP.    OBJECTIVE IMPAIRMENTS: decreased activity tolerance, difficulty walking, decreased strength, impaired perceived functional ability, and pain.   ACTIVITY LIMITATIONS: stairs and locomotion level  PARTICIPATION LIMITATIONS: cleaning, shopping, and community activity  PERSONAL FACTORS: 1 comorbidity: history of knee OA  are also affecting patient's functional outcome.   REHAB POTENTIAL: Good  CLINICAL DECISION MAKING: Stable/uncomplicated  EVALUATION COMPLEXITY: Low   GOALS: Goals reviewed with patient? Yes  SHORT TERM GOALS: Target date: 12/19/2022   The patient will demonstrate knowledge of basic self care strategies and exercises to promote healing   Baseline: Goal status: met 4/2  2.  The patient will report a 30% improvement in pain levels with functional activities which are currently difficult including walking and negotiating higher steps Baseline:  Goal status: ongoing  3.  The patient will have improved hip and knee strength to at least 4/5 needed for standing, walking longer distances and descending stairs at home and in the community  Baseline:  Goal status: met 4/2  4.  The patient will be able to ambulate 6 min/600 feet  Baseline:  Goal status:  met 4/2     LONG TERM GOALS: Target date: 01/16/2023   The patient will be independent in a safe self progression of a home exercise program to promote further recovery of function  Baseline:  Goal status: INITIAL  2.  The patient will report a 70% improvement in pain levels with functional activities which are currently difficult including  Baseline:  Goal status: INITIAL  3.  The patient will have improved hip and knee strength to at least 4+/5 needed for standing, walking longer distances and descending stairs at home and in the community  Baseline:  Goal status: INITIAL  4.  The patient will have improved gait stamina and strength to ambulate around the block Baseline:  Goal status:  INITIAL  5.  LEFS score improved to   66/80 indicating improved function with less pain Baseline:  Goal status: INITIAL   PLAN:  PT FREQUENCY: 2x/week  PT DURATION: 8 weeks  PLANNED INTERVENTIONS: Therapeutic exercises, Therapeutic activity, Neuromuscular re-education, Balance training, Gait training, Patient/Family education, Self Care, Joint mobilization, Aquatic Therapy, Dry Needling, Cryotherapy, Moist heat, Taping, Manual therapy, and Re-evaluation  PLAN FOR NEXT SESSION:  ERO next visit; kettlebell to dead lifts and squats; lower quarter strengthening particularly quads and gluteals; progress HEP  Lavinia Sharps, PT 01/02/23 5:28 PM Phone: [phone] Fax: [phone]  [facility] [address], [address] [place], [place] [address]  Phone # [phone] Fax [phone]

## 2023-01-09 ENCOUNTER — Encounter: Payer: Medicare (Managed Care) | Admitting: Physical Therapy

## 2023-01-14 ENCOUNTER — Ambulatory Visit: Payer: Medicare (Managed Care) | Admitting: Physical Therapy

## 2023-01-14 DIAGNOSIS — M79605 Pain in left leg: Secondary | ICD-10-CM

## 2023-01-14 DIAGNOSIS — M79604 Pain in right leg: Secondary | ICD-10-CM | POA: Diagnosis not present

## 2023-01-14 DIAGNOSIS — M6281 Muscle weakness (generalized): Secondary | ICD-10-CM

## 2023-01-14 NOTE — Therapy (Signed)
OUTPATIENT PHYSICAL THERAPY LOWER EXTREMITY PROGRESS/DISCHARGE SUMMARY  Patient Name: Mercedes Thompson MRN: [id number] DOB:12/05/1945, 77 y.o., female Today's Date: 01/14/2023  END OF SESSION:  PT End of Session - 01/14/23 0757     Visit Number 8    Date for PT Re-Evaluation 01/16/23    Authorization Type [employer]    PT Start Time 0759    PT Stop Time 0838    PT Time Calculation (min) 39 min    Activity Tolerance Patient tolerated treatment well             Past Medical History:  Diagnosis Date   AML (acute myeloblastic leukemia) (HCC) 1987   Anxiety    Appendicitis with peritoneal abscess 03/2015   s/p ex lap at [facility]   Atrial flutter [facility]) 2016   s/p cardioversion 02/2015, previously in Tikosyn, recently taken off amiodarone   Bradycardia 2015   s/p PPM insertion   H/O mitral valve replacement with mechanical valve    coumadin   Past Surgical History:  Procedure Laterality Date   APPENDECTOMY  03/24/2015   [facility] Dr Cherly Hensen.  perforated appendicitis   heart ablation     MITRAL VALVE REPLACEMENT  1986   PACEMAKER INSERTION     TUBAL LIGATION     Patient Active Problem List   Diagnosis Date Noted   Appendicitis with perforation s/p lap appy 03/22/2015 10/29/2015   Intra-abdominal abscess post-lap appy July 2016 10/29/2015   Intra-abdominal abscess (HCC) 10/29/2015   Abscess of abdominal cavity (HCC) 10/29/2015   Artificial cardiac pacemaker 05/17/2015   Sinoatrial node dysfunction (HCC) 05/17/2015   Long term current use of anticoagulant 03/22/2015   Encounter for therapeutic drug level monitoring 03/07/2015   Female genuine stress incontinence 03/25/2013   Anxiety disorder 08/14/2011   Dyslipidemia 08/13/2011   Atrial fibrillation (HCC) 06/11/2011   H/O mitral valve replacement with mechanical valve 06/11/2011    PCP: Shirlean Mylar MD  REFERRING PROVIDER: Raquel James PA-C  REFERRING DIAG: [id number], M79.605 bil leg pain  THERAPY DIAG: bil leg pain;  weakness   Rationale for Evaluation and Treatment: Rehabilitation  ONSET DATE: Jan 2024  SUBJECTIVE:   SUBJECTIVE STATEMENT: My back has been hurting mid back to low back.  My legs (HS area and quads) are still hurting especially when I get up off the low sofa.  It helps to sleep with a pillow behind my knees.  20-30% better as far as HS/lower leg pain but the knee is much better than it was.  PERTINENT HISTORY: Pacemaker HR 60-120 Right knee OA Wears orthotics  PAIN:  PAIN:  Are you having pain? Yes NPRS scale: 6/10 Pain location: bil lower leg pain Aggravating factors: walk, stiff in AM; stepping up on higher step Relieving factors: sitting  PRECAUTIONS: ICD/Pacemaker  WEIGHT BEARING RESTRICTIONS: No  FALLS:  Has patient fallen in last 6 months? No  LIVING ENVIRONMENT: Lives with: lives with their spouse Lives in: House/apartment Stairs: Yes: External: 3 steps; can reach both OCCUPATION: retired  PLOF: Independent  PATIENT GOALS: be more flexible; walk without pain (can't walk the block); get stronger    OBJECTIVE:   DIAGNOSTIC FINDINGS: per patient x-rays "very little knee OA"  PATIENT SURVEYS:  Lower Extremity Functional Scale LEFS:  56/80 4/30:  44/80 COGNITION: Overall cognitive status: Within functional limits for tasks assessed      POSTURE: valgus knee right    LOWER EXTREMITY ROM:  right and left knee flexion 130 degrees; knee extension +3  bil ;  standing flexion fingertips to floor  LOWER EXTREMITY MMT:  right > left pelvic drop with SLS 5 sec on both sides 4/30:  pelvic drop initially but able to correct with cueing  MMT Right eval Left eval 4/2 4/30  Hip flexion 5 5    Hip extension      Hip abduction 4- 4- 4 bil 4+ bil  Hip adduction      Hip internal rotation      Hip external rotation      Knee flexion      Knee extension 4- 4- 4 bil 4+ bil  Ankle dorsiflexion      Ankle plantarflexion      Ankle inversion      Ankle  eversion       (Blank rows = not tested)  LOWER EXTREMITY SPECIAL TESTS:  LE discomfort produced with seated neural tension especially adding in ankle DF  FUNCTIONAL TESTS:  5x sit to stand:  12.48 no UE assist TUG 8.21 no UE assist  4/30; 5x sit to stand 11.74 TUG:  6.87   GAIT:  Comments: dec stride length, increased adduction; mild Trendelenburg;  stairs reciprocally bil handrails used  TODAY'S TREATMENT:     DATE: 4/30 Nu-Step L1 5 min while discussing status  Squat technique with wider stance Info on [facility] 9 week ex program SLS 5x sit to stand TUG LEFS Discussion and review of HEP including top priority ex's Review of progress toward goals Therapeutic activities:  sit to stand, standing, walking, negotiating curbs, steps, lifting, pushing, pulling      DATE: 4/18 Nu-Step L1 5 min while discussing status  Squats no weight in front of 20 inch box 2 sets of 5 Wall pull aways 10x (anterior tibialis) Red loop above knees hip 3 ways 10x right/left (add padding next time for comfort) 6 inch step ups 10x right/left single arm support no weight Seated red power loop HS curls with padding at the ankle 2x10 right/left Hip machine 40#: abduction, extension, flexion right/left 10x   Seated back stretch 3 ways with blue ball 2 minutes Open books 10x right/left side Leg press seat 7 slight recline 90# bil 15x;  single leg 45# 10x right/left Therapeutic activities:  sit to stand, standing, walking, negotiating curbs, steps, lifting, pushing, pulling                   PATIENT EDUCATION:  Education details: Educated patient on anatomy and physiology of current symptoms, prognosis, plan of care as well as initial self care strategies to promote recovery  Person educated: Patient Education method: Explanation Education comprehension: verbalized understanding  HOME EXERCISE PROGRAM: Access Code: [email or web address] URL: [email or web address] Date: 01/02/2023 Prepared  by: Lavinia Sharps  Exercises - Seated Long Arc Quad  - 1 x daily - 7 x weekly - 3 sets - 10 reps - Seated Hip Abduction with Resistance  - 1 x daily - 7 x weekly - 2 sets - 10 reps - Supine ITB Stretch with Strap  - 1 x daily - 7 x weekly - 1 sets - 3 reps - 30 sec hold - Supine Figure 4 Piriformis Stretch  - 1 x daily - 7 x weekly - 1 sets - 3 reps - 30 hold - Clamshell  - 1 x daily - 7 x weekly - 1 sets - 10 reps - Side Stepping with Resistance at Thighs and Counter Support  - 1 x daily - 7 x weekly - 1  sets - 10 reps - Forward Step Down  - 1 x daily - 7 x weekly - 1 sets - 10 reps - Step Taps on High Step  - 1 x daily - 7 x weekly - 1 sets - 10 reps - Single Leg 3 Way Reach with Support  - 1 x daily - 7 x weekly - 1 sets - 10 reps - Standing Hamstring Stretch with Step  - 1 x daily - 7 x weekly - 1 sets - 10 reps - Seated March with Ankle Weights at Foot  - 1 x daily - 7 x weekly - 3 sets - 10 reps - Single Leg Knee Extension with Weight Machine  - 1 x daily - 7 x weekly - 3 sets - 10 reps - Single Leg Hamstring Curl with Weight Machine  - 1 x daily - 7 x weekly - 3 sets - 10 reps - Full Leg Press with Resistance Around Knees  - 1 x daily - 7 x weekly - 3 sets - 10 reps - Hip Adduction Machine  - 1 x daily - 7 x weekly - 3 sets - 10 reps - Hip Abduction Machine  - 1 x daily - 7 x weekly - 3 sets - 10 reps - Standing Cable Hip Extension  - 1 x daily - 7 x weekly - 3 sets - 10 reps - Standing Cable Hip Flexion  - 1 x daily - 7 x weekly - 3 sets - 10 reps - Standing Cable Hip Abduction  - 1 x daily - 7 x weekly - 3 sets - 10 reps - Supine Sciatic Nerve Glide  - 1 x daily - 7 x weekly - 1 sets - 10 reps - Seated Slump Nerve Glide (Mirrored)  - 1 x daily - 7 x weekly - 1 sets - 10 reps - Forward Step Up  - 1 x daily - 7 x weekly - 1 sets - 10 reps - Quarter Squat with Table  - 1 x daily - 7 x weekly - 1 sets - 10 reps - Sit to Stand Without Arm Support  - 1 x daily - 7 x weekly - 1 sets - 10  reps - Deadlift With Dumbbells  - 1 x daily - 7 x weekly - 3 sets - 5 reps - Supine Hamstring Curl on Swiss Ball  - 1 x daily - 7 x weekly - 1 sets - 10 reps - Supine Bridge with Pelvic Floor Contraction on Swiss Ball  - 1 x daily - 7 x weekly - 1 sets - 10 reps - Supine Bridge with Pelvic Floor Contraction  - 1 x daily - 7 x weekly - 1 sets - 10 reps - Plank on Knees  - 1 x daily - 7 x weekly - 1 sets - 8 reps - 5 hold - Sidelying Open Book Thoracic Rotation with Knee on Foam Roll  - 1 x daily - 7 x weekly - 1 sets - 10 reps - Seated Hamstring Curl with Anchored Resistance  - 1 x daily - 7 x weekly - 2 sets - 10 reps ASSESSMENT:  CLINICAL IMPRESSION: The patient has met the majority of rehab goals, with  improvements in pain reduction,  strength and functional mobility.  A comprehensive HEP has been established and anticipate further improvements over time with regular performance of the program.  Recommend discharge from PT at this time.   OBJECTIVE IMPAIRMENTS: decreased activity tolerance, difficulty walking, decreased  strength, impaired perceived functional ability, and pain.   ACTIVITY LIMITATIONS: stairs and locomotion level  PARTICIPATION LIMITATIONS: cleaning, shopping, and community activity  PERSONAL FACTORS: 1 comorbidity: history of knee OA  are also affecting patient's functional outcome.   REHAB POTENTIAL: Good  CLINICAL DECISION MAKING: Stable/uncomplicated  EVALUATION COMPLEXITY: Low   GOALS: Goals reviewed with patient? Yes  SHORT TERM GOALS: Target date: 12/19/2022   The patient will demonstrate knowledge of basic self care strategies and exercises to promote healing   Baseline: Goal status: met 4/2  2.  The patient will report a 30% improvement in pain levels with functional activities which are currently difficult including walking and negotiating higher steps Baseline:  Goal status: met 4/30  3.  The patient will have improved hip and knee strength to at  least 4/5 needed for standing, walking longer distances and descending stairs at home and in the community  Baseline:  Goal status: met 4/2  4.  The patient will be able to ambulate 6 min/600 feet  Baseline:  Goal status:  met 4/2     LONG TERM GOALS: Target date: 01/16/2023   The patient will be independent in a safe self progression of a home exercise program to promote further recovery of function  Baseline:  Goal status: met 4/30  2.  The patient will report a 70% improvement in pain levels with functional activities which are currently difficult including  Baseline:  Goal status: not met  3.  The patient will have improved hip and knee strength to at least 4+/5 needed for standing, walking longer distances and descending stairs at home and in the community  Baseline:  Goal status: met 4/30  4.  The patient will have improved gait stamina and strength to ambulate around the block Baseline:  Goal status: met 4/30 5.  LEFS score improved to   66/80 indicating improved function with less pain Baseline:  Goal status: not met   PLAN: PHYSICAL THERAPY DISCHARGE SUMMARY  Visits from Start of Care: 8  Current functional level related to goals / functional outcomes: See clinical impressions above   Remaining deficits: As above   Education / Equipment: HEP   Patient agrees to discharge. Patient goals were met. Patient is being discharged due to meeting the stated rehab goals.   Lavinia Sharps, PT 01/14/23 8:40 AM Phone: [phone] Fax: [phone]  [facility] [address] [place], [place] [address] Phone # [phone] Fax [phone]

## 2023-01-16 ENCOUNTER — Ambulatory Visit: Payer: Medicare (Managed Care) | Admitting: Physical Therapy

## 2023-01-22 DIAGNOSIS — G4733 Obstructive sleep apnea (adult) (pediatric): Secondary | ICD-10-CM | POA: Diagnosis not present

## 2023-01-23 ENCOUNTER — Ambulatory Visit: Payer: Medicare (Managed Care) | Admitting: Physical Therapy

## 2023-01-27 DIAGNOSIS — Z7901 Long term (current) use of anticoagulants: Secondary | ICD-10-CM | POA: Diagnosis not present

## 2023-02-04 DIAGNOSIS — G4733 Obstructive sleep apnea (adult) (pediatric): Secondary | ICD-10-CM | POA: Diagnosis not present

## 2023-02-04 DIAGNOSIS — Z6835 Body mass index (BMI) 35.0-35.9, adult: Secondary | ICD-10-CM | POA: Diagnosis not present

## 2023-02-04 DIAGNOSIS — E785 Hyperlipidemia, unspecified: Secondary | ICD-10-CM | POA: Diagnosis not present

## 2023-02-04 DIAGNOSIS — R7303 Prediabetes: Secondary | ICD-10-CM | POA: Diagnosis not present

## 2023-02-14 DIAGNOSIS — Z45010 Encounter for checking and testing of cardiac pacemaker pulse generator [battery]: Secondary | ICD-10-CM | POA: Diagnosis not present

## 2023-02-14 DIAGNOSIS — I480 Paroxysmal atrial fibrillation: Secondary | ICD-10-CM | POA: Diagnosis not present

## 2023-02-15 DIAGNOSIS — I480 Paroxysmal atrial fibrillation: Secondary | ICD-10-CM | POA: Diagnosis not present

## 2023-02-15 DIAGNOSIS — Z45010 Encounter for checking and testing of cardiac pacemaker pulse generator [battery]: Secondary | ICD-10-CM | POA: Diagnosis not present

## 2023-02-17 DIAGNOSIS — F330 Major depressive disorder, recurrent, mild: Secondary | ICD-10-CM | POA: Diagnosis not present

## 2023-02-17 DIAGNOSIS — Z7901 Long term (current) use of anticoagulants: Secondary | ICD-10-CM | POA: Diagnosis not present

## 2023-02-17 DIAGNOSIS — I4892 Unspecified atrial flutter: Secondary | ICD-10-CM | POA: Diagnosis not present

## 2023-02-17 DIAGNOSIS — I2721 Secondary pulmonary arterial hypertension: Secondary | ICD-10-CM | POA: Diagnosis not present

## 2023-02-17 DIAGNOSIS — I4891 Unspecified atrial fibrillation: Secondary | ICD-10-CM | POA: Diagnosis not present

## 2023-02-17 DIAGNOSIS — F419 Anxiety disorder, unspecified: Secondary | ICD-10-CM | POA: Diagnosis not present

## 2023-02-17 DIAGNOSIS — R7303 Prediabetes: Secondary | ICD-10-CM | POA: Diagnosis not present

## 2023-02-17 DIAGNOSIS — D6869 Other thrombophilia: Secondary | ICD-10-CM | POA: Diagnosis not present

## 2023-02-17 DIAGNOSIS — G4700 Insomnia, unspecified: Secondary | ICD-10-CM | POA: Diagnosis not present

## 2023-02-28 DIAGNOSIS — I10 Essential (primary) hypertension: Secondary | ICD-10-CM | POA: Diagnosis not present

## 2023-02-28 DIAGNOSIS — I4891 Unspecified atrial fibrillation: Secondary | ICD-10-CM | POA: Diagnosis not present

## 2023-02-28 DIAGNOSIS — G4733 Obstructive sleep apnea (adult) (pediatric): Secondary | ICD-10-CM | POA: Diagnosis not present

## 2023-02-28 DIAGNOSIS — Z950 Presence of cardiac pacemaker: Secondary | ICD-10-CM | POA: Diagnosis not present

## 2023-02-28 DIAGNOSIS — I4892 Unspecified atrial flutter: Secondary | ICD-10-CM | POA: Diagnosis not present

## 2023-02-28 DIAGNOSIS — Z952 Presence of prosthetic heart valve: Secondary | ICD-10-CM | POA: Diagnosis not present

## 2023-02-28 DIAGNOSIS — F419 Anxiety disorder, unspecified: Secondary | ICD-10-CM | POA: Diagnosis not present

## 2023-02-28 DIAGNOSIS — Z7901 Long term (current) use of anticoagulants: Secondary | ICD-10-CM | POA: Diagnosis not present

## 2023-02-28 DIAGNOSIS — E785 Hyperlipidemia, unspecified: Secondary | ICD-10-CM | POA: Diagnosis not present

## 2023-02-28 DIAGNOSIS — N3289 Other specified disorders of bladder: Secondary | ICD-10-CM | POA: Diagnosis not present

## 2023-02-28 DIAGNOSIS — Z01818 Encounter for other preprocedural examination: Secondary | ICD-10-CM | POA: Diagnosis not present

## 2023-02-28 DIAGNOSIS — N3281 Overactive bladder: Secondary | ICD-10-CM | POA: Diagnosis not present

## 2023-02-28 DIAGNOSIS — I5030 Unspecified diastolic (congestive) heart failure: Secondary | ICD-10-CM | POA: Diagnosis not present

## 2023-02-28 DIAGNOSIS — E7849 Other hyperlipidemia: Secondary | ICD-10-CM | POA: Diagnosis not present

## 2023-02-28 DIAGNOSIS — I110 Hypertensive heart disease with heart failure: Secondary | ICD-10-CM | POA: Diagnosis not present

## 2023-03-07 DIAGNOSIS — Z7901 Long term (current) use of anticoagulants: Secondary | ICD-10-CM | POA: Diagnosis not present

## 2023-03-10 DIAGNOSIS — F419 Anxiety disorder, unspecified: Secondary | ICD-10-CM | POA: Diagnosis not present

## 2023-03-10 DIAGNOSIS — I484 Atypical atrial flutter: Secondary | ICD-10-CM | POA: Diagnosis not present

## 2023-03-10 DIAGNOSIS — Z950 Presence of cardiac pacemaker: Secondary | ICD-10-CM | POA: Diagnosis not present

## 2023-03-10 DIAGNOSIS — I480 Paroxysmal atrial fibrillation: Secondary | ICD-10-CM | POA: Diagnosis not present

## 2023-03-10 DIAGNOSIS — G4733 Obstructive sleep apnea (adult) (pediatric): Secondary | ICD-10-CM | POA: Diagnosis not present

## 2023-03-10 DIAGNOSIS — I5030 Unspecified diastolic (congestive) heart failure: Secondary | ICD-10-CM | POA: Diagnosis not present

## 2023-03-10 DIAGNOSIS — I110 Hypertensive heart disease with heart failure: Secondary | ICD-10-CM | POA: Diagnosis not present

## 2023-03-10 DIAGNOSIS — N3281 Overactive bladder: Secondary | ICD-10-CM | POA: Diagnosis not present

## 2023-03-10 DIAGNOSIS — Z79899 Other long term (current) drug therapy: Secondary | ICD-10-CM | POA: Diagnosis not present

## 2023-03-10 DIAGNOSIS — I495 Sick sinus syndrome: Secondary | ICD-10-CM | POA: Diagnosis not present

## 2023-03-10 DIAGNOSIS — R9431 Abnormal electrocardiogram [ECG] [EKG]: Secondary | ICD-10-CM | POA: Diagnosis not present

## 2023-03-10 DIAGNOSIS — Z952 Presence of prosthetic heart valve: Secondary | ICD-10-CM | POA: Diagnosis not present

## 2023-03-12 DIAGNOSIS — I4589 Other specified conduction disorders: Secondary | ICD-10-CM | POA: Diagnosis not present

## 2023-03-24 DIAGNOSIS — R7303 Prediabetes: Secondary | ICD-10-CM | POA: Diagnosis not present

## 2023-03-24 DIAGNOSIS — E785 Hyperlipidemia, unspecified: Secondary | ICD-10-CM | POA: Diagnosis not present

## 2023-03-24 DIAGNOSIS — Z6835 Body mass index (BMI) 35.0-35.9, adult: Secondary | ICD-10-CM | POA: Diagnosis not present

## 2023-03-24 DIAGNOSIS — G4733 Obstructive sleep apnea (adult) (pediatric): Secondary | ICD-10-CM | POA: Diagnosis not present

## 2023-04-02 DIAGNOSIS — Z7901 Long term (current) use of anticoagulants: Secondary | ICD-10-CM | POA: Diagnosis not present

## 2023-04-03 DIAGNOSIS — I484 Atypical atrial flutter: Secondary | ICD-10-CM | POA: Diagnosis not present

## 2023-04-03 DIAGNOSIS — Z7901 Long term (current) use of anticoagulants: Secondary | ICD-10-CM | POA: Diagnosis not present

## 2023-04-04 DIAGNOSIS — G4733 Obstructive sleep apnea (adult) (pediatric): Secondary | ICD-10-CM | POA: Diagnosis not present

## 2023-04-04 DIAGNOSIS — G4700 Insomnia, unspecified: Secondary | ICD-10-CM | POA: Diagnosis not present

## 2023-04-04 DIAGNOSIS — F330 Major depressive disorder, recurrent, mild: Secondary | ICD-10-CM | POA: Diagnosis not present

## 2023-04-07 DIAGNOSIS — I4891 Unspecified atrial fibrillation: Secondary | ICD-10-CM | POA: Diagnosis not present

## 2023-04-07 DIAGNOSIS — Z7901 Long term (current) use of anticoagulants: Secondary | ICD-10-CM | POA: Diagnosis not present

## 2023-04-07 DIAGNOSIS — G4733 Obstructive sleep apnea (adult) (pediatric): Secondary | ICD-10-CM | POA: Diagnosis not present

## 2023-04-24 ENCOUNTER — Telehealth: Payer: Self-pay | Admitting: *Deleted

## 2023-04-24 NOTE — Telephone Encounter (Signed)
Hi Dr Rhea Belton! This is a patient this transferring to you and  is on Coumadin.  Please advise if she needs to be seen in office. Thank you- Irving Burton

## 2023-04-24 NOTE — Telephone Encounter (Signed)
Attempted contact with patient to schedule an office visit with APP.  Explained Dr Rhea Belton has no openings until November and  to request a new patient appointment  with one of the PA/NPs in order to proceed with colonoscopy.  Also advised we would need to receive clearance from prescribing MD to hold Coumadin.

## 2023-04-24 NOTE — Telephone Encounter (Signed)
Yes, needs OV with me or APP JMP

## 2023-04-25 NOTE — Telephone Encounter (Signed)
Attempted patient second time to schedule office visit.  Left voicemail and mailed letter asking patient to schedule office visit before proceeding with colonoscopy.

## 2023-04-28 DIAGNOSIS — R7303 Prediabetes: Secondary | ICD-10-CM | POA: Diagnosis not present

## 2023-04-28 DIAGNOSIS — G4733 Obstructive sleep apnea (adult) (pediatric): Secondary | ICD-10-CM | POA: Diagnosis not present

## 2023-04-28 DIAGNOSIS — Z6835 Body mass index (BMI) 35.0-35.9, adult: Secondary | ICD-10-CM | POA: Diagnosis not present

## 2023-04-28 DIAGNOSIS — E785 Hyperlipidemia, unspecified: Secondary | ICD-10-CM | POA: Diagnosis not present

## 2023-04-29 DIAGNOSIS — I484 Atypical atrial flutter: Secondary | ICD-10-CM | POA: Diagnosis not present

## 2023-04-29 DIAGNOSIS — I495 Sick sinus syndrome: Secondary | ICD-10-CM | POA: Diagnosis not present

## 2023-04-29 DIAGNOSIS — Z950 Presence of cardiac pacemaker: Secondary | ICD-10-CM | POA: Diagnosis not present

## 2023-04-29 DIAGNOSIS — I480 Paroxysmal atrial fibrillation: Secondary | ICD-10-CM | POA: Diagnosis not present

## 2023-05-01 ENCOUNTER — Encounter: Payer: Self-pay | Admitting: Physician Assistant

## 2023-05-02 DIAGNOSIS — H2513 Age-related nuclear cataract, bilateral: Secondary | ICD-10-CM | POA: Diagnosis not present

## 2023-05-02 DIAGNOSIS — H01119 Allergic dermatitis of unspecified eye, unspecified eyelid: Secondary | ICD-10-CM | POA: Diagnosis not present

## 2023-05-02 DIAGNOSIS — H25043 Posterior subcapsular polar age-related cataract, bilateral: Secondary | ICD-10-CM | POA: Diagnosis not present

## 2023-05-06 DIAGNOSIS — Z7901 Long term (current) use of anticoagulants: Secondary | ICD-10-CM | POA: Diagnosis not present

## 2023-05-16 DIAGNOSIS — I480 Paroxysmal atrial fibrillation: Secondary | ICD-10-CM | POA: Diagnosis not present

## 2023-05-16 DIAGNOSIS — Z45010 Encounter for checking and testing of cardiac pacemaker pulse generator [battery]: Secondary | ICD-10-CM | POA: Diagnosis not present

## 2023-05-18 DIAGNOSIS — Z45010 Encounter for checking and testing of cardiac pacemaker pulse generator [battery]: Secondary | ICD-10-CM | POA: Diagnosis not present

## 2023-05-18 DIAGNOSIS — I480 Paroxysmal atrial fibrillation: Secondary | ICD-10-CM | POA: Diagnosis not present

## 2023-05-21 DIAGNOSIS — I444 Left anterior fascicular block: Secondary | ICD-10-CM | POA: Diagnosis not present

## 2023-05-30 DIAGNOSIS — J189 Pneumonia, unspecified organism: Secondary | ICD-10-CM | POA: Diagnosis not present

## 2023-05-30 DIAGNOSIS — R051 Acute cough: Secondary | ICD-10-CM | POA: Diagnosis not present

## 2023-05-30 DIAGNOSIS — R5383 Other fatigue: Secondary | ICD-10-CM | POA: Diagnosis not present

## 2023-05-30 DIAGNOSIS — R509 Fever, unspecified: Secondary | ICD-10-CM | POA: Diagnosis not present

## 2023-06-02 ENCOUNTER — Encounter: Payer: Medicare (Managed Care) | Admitting: Internal Medicine

## 2023-06-04 DIAGNOSIS — Z7901 Long term (current) use of anticoagulants: Secondary | ICD-10-CM | POA: Diagnosis not present

## 2023-06-09 DIAGNOSIS — I480 Paroxysmal atrial fibrillation: Secondary | ICD-10-CM | POA: Diagnosis not present

## 2023-06-11 DIAGNOSIS — E785 Hyperlipidemia, unspecified: Secondary | ICD-10-CM | POA: Diagnosis not present

## 2023-06-11 DIAGNOSIS — G4733 Obstructive sleep apnea (adult) (pediatric): Secondary | ICD-10-CM | POA: Diagnosis not present

## 2023-06-11 DIAGNOSIS — R7303 Prediabetes: Secondary | ICD-10-CM | POA: Diagnosis not present

## 2023-06-11 DIAGNOSIS — Z6835 Body mass index (BMI) 35.0-35.9, adult: Secondary | ICD-10-CM | POA: Diagnosis not present

## 2023-06-13 DIAGNOSIS — N390 Urinary tract infection, site not specified: Secondary | ICD-10-CM | POA: Diagnosis not present

## 2023-06-22 DIAGNOSIS — I499 Cardiac arrhythmia, unspecified: Secondary | ICD-10-CM | POA: Diagnosis not present

## 2023-06-26 DIAGNOSIS — Z7901 Long term (current) use of anticoagulants: Secondary | ICD-10-CM | POA: Diagnosis not present

## 2023-06-26 DIAGNOSIS — N390 Urinary tract infection, site not specified: Secondary | ICD-10-CM | POA: Diagnosis not present

## 2023-06-27 DIAGNOSIS — Z950 Presence of cardiac pacemaker: Secondary | ICD-10-CM | POA: Diagnosis not present

## 2023-06-27 DIAGNOSIS — Z888 Allergy status to other drugs, medicaments and biological substances status: Secondary | ICD-10-CM | POA: Diagnosis not present

## 2023-06-27 DIAGNOSIS — I5032 Chronic diastolic (congestive) heart failure: Secondary | ICD-10-CM | POA: Diagnosis not present

## 2023-06-27 DIAGNOSIS — I484 Atypical atrial flutter: Secondary | ICD-10-CM | POA: Diagnosis not present

## 2023-06-27 DIAGNOSIS — I110 Hypertensive heart disease with heart failure: Secondary | ICD-10-CM | POA: Diagnosis not present

## 2023-06-27 DIAGNOSIS — Z7901 Long term (current) use of anticoagulants: Secondary | ICD-10-CM | POA: Diagnosis not present

## 2023-06-27 DIAGNOSIS — Z881 Allergy status to other antibiotic agents status: Secondary | ICD-10-CM | POA: Diagnosis not present

## 2023-06-27 DIAGNOSIS — F419 Anxiety disorder, unspecified: Secondary | ICD-10-CM | POA: Diagnosis not present

## 2023-06-27 DIAGNOSIS — I495 Sick sinus syndrome: Secondary | ICD-10-CM | POA: Diagnosis not present

## 2023-06-27 DIAGNOSIS — Z882 Allergy status to sulfonamides status: Secondary | ICD-10-CM | POA: Diagnosis not present

## 2023-06-27 DIAGNOSIS — E7849 Other hyperlipidemia: Secondary | ICD-10-CM | POA: Diagnosis not present

## 2023-06-27 DIAGNOSIS — I480 Paroxysmal atrial fibrillation: Secondary | ICD-10-CM | POA: Diagnosis not present

## 2023-06-27 DIAGNOSIS — G4733 Obstructive sleep apnea (adult) (pediatric): Secondary | ICD-10-CM | POA: Diagnosis not present

## 2023-06-30 DIAGNOSIS — Z7901 Long term (current) use of anticoagulants: Secondary | ICD-10-CM | POA: Diagnosis not present

## 2023-07-08 DIAGNOSIS — J40 Bronchitis, not specified as acute or chronic: Secondary | ICD-10-CM | POA: Diagnosis not present

## 2023-07-08 DIAGNOSIS — J069 Acute upper respiratory infection, unspecified: Secondary | ICD-10-CM | POA: Diagnosis not present

## 2023-07-08 DIAGNOSIS — R062 Wheezing: Secondary | ICD-10-CM | POA: Diagnosis not present

## 2023-07-18 DIAGNOSIS — J40 Bronchitis, not specified as acute or chronic: Secondary | ICD-10-CM | POA: Diagnosis not present

## 2023-07-18 DIAGNOSIS — G4733 Obstructive sleep apnea (adult) (pediatric): Secondary | ICD-10-CM | POA: Diagnosis not present

## 2023-07-18 DIAGNOSIS — F330 Major depressive disorder, recurrent, mild: Secondary | ICD-10-CM | POA: Diagnosis not present

## 2023-07-18 DIAGNOSIS — J988 Other specified respiratory disorders: Secondary | ICD-10-CM | POA: Diagnosis not present

## 2023-07-18 DIAGNOSIS — Z7901 Long term (current) use of anticoagulants: Secondary | ICD-10-CM | POA: Diagnosis not present

## 2023-07-18 DIAGNOSIS — G4700 Insomnia, unspecified: Secondary | ICD-10-CM | POA: Diagnosis not present

## 2023-07-18 DIAGNOSIS — R5383 Other fatigue: Secondary | ICD-10-CM | POA: Diagnosis not present

## 2023-07-21 ENCOUNTER — Encounter: Payer: Self-pay | Admitting: Physician Assistant

## 2023-07-21 ENCOUNTER — Ambulatory Visit (INDEPENDENT_AMBULATORY_CARE_PROVIDER_SITE_OTHER): Payer: Medicare (Managed Care) | Admitting: Physician Assistant

## 2023-07-21 VITALS — BP 100/60 | HR 72 | Ht 65.5 in | Wt 219.1 lb

## 2023-07-21 DIAGNOSIS — I4891 Unspecified atrial fibrillation: Secondary | ICD-10-CM | POA: Diagnosis not present

## 2023-07-21 DIAGNOSIS — Z952 Presence of prosthetic heart valve: Secondary | ICD-10-CM

## 2023-07-21 DIAGNOSIS — I495 Sick sinus syndrome: Secondary | ICD-10-CM | POA: Diagnosis not present

## 2023-07-21 DIAGNOSIS — Z7901 Long term (current) use of anticoagulants: Secondary | ICD-10-CM

## 2023-07-21 DIAGNOSIS — Z860100 Personal history of colon polyps, unspecified: Secondary | ICD-10-CM | POA: Diagnosis not present

## 2023-07-21 NOTE — Patient Instructions (Addendum)
Follow up in 1 year  First do a trial off milk/lactose products if you use them.   Add fiber like benefiber or citracel once a day  Please try to decrease stress. consider talking with PCP about anti anxiety medication or try head space app for meditation. if any worsening symptoms like blood in stool, weight loss, please call the office     FODMAP stands for fermentable oligo-, di-, mono-saccharides and polyols (1). These are the scientific terms used to classify groups of carbs that are difficult for our body to digest and that are notorious for triggering digestive symptoms like bloating, gas, loose stools and stomach pain.   You can try low FODMAP diet  - start with eliminating just one column at a time that you feel may be a trigger for you. - the table at the very bottom contains foods that are low in FODMAPs   Sometimes trying to eliminate the FODMAP's from your diet is difficult or tricky, if you are stuggling with trying to do the elimination diet you can try an enzyme.  There is a food enzymes that you sprinkle in or on your food that helps break down the FODMAP. You can read more about the enzyme by going to this site: https://fodzyme.com/

## 2023-07-23 DIAGNOSIS — Z6834 Body mass index (BMI) 34.0-34.9, adult: Secondary | ICD-10-CM | POA: Diagnosis not present

## 2023-07-23 DIAGNOSIS — E785 Hyperlipidemia, unspecified: Secondary | ICD-10-CM | POA: Diagnosis not present

## 2023-07-23 DIAGNOSIS — E66811 Obesity, class 1: Secondary | ICD-10-CM | POA: Diagnosis not present

## 2023-07-23 DIAGNOSIS — G4733 Obstructive sleep apnea (adult) (pediatric): Secondary | ICD-10-CM | POA: Diagnosis not present

## 2023-07-23 DIAGNOSIS — R7303 Prediabetes: Secondary | ICD-10-CM | POA: Diagnosis not present

## 2023-07-23 NOTE — Progress Notes (Signed)
Addendum: Reviewed and agree with assessment and management plan. Reasonable based on her history and wishes to defer colonoscopy at this time [place] Rev[place], Carie Caddy, MD

## 2023-07-30 ENCOUNTER — Encounter: Payer: Self-pay | Admitting: Psychiatry

## 2023-08-15 DIAGNOSIS — I480 Paroxysmal atrial fibrillation: Secondary | ICD-10-CM | POA: Diagnosis not present

## 2023-08-18 DIAGNOSIS — Z45018 Encounter for adjustment and management of other part of cardiac pacemaker: Secondary | ICD-10-CM | POA: Diagnosis not present

## 2023-08-18 DIAGNOSIS — I4819 Other persistent atrial fibrillation: Secondary | ICD-10-CM | POA: Diagnosis not present

## 2023-08-20 DIAGNOSIS — Z1231 Encounter for screening mammogram for malignant neoplasm of breast: Secondary | ICD-10-CM | POA: Diagnosis not present

## 2023-08-21 DIAGNOSIS — I480 Paroxysmal atrial fibrillation: Secondary | ICD-10-CM | POA: Diagnosis not present

## 2023-08-21 DIAGNOSIS — Z950 Presence of cardiac pacemaker: Secondary | ICD-10-CM | POA: Diagnosis not present

## 2023-08-21 DIAGNOSIS — Z9889 Other specified postprocedural states: Secondary | ICD-10-CM | POA: Diagnosis not present

## 2023-08-21 DIAGNOSIS — Z79899 Other long term (current) drug therapy: Secondary | ICD-10-CM | POA: Diagnosis not present

## 2023-08-21 DIAGNOSIS — Z7901 Long term (current) use of anticoagulants: Secondary | ICD-10-CM | POA: Diagnosis not present

## 2023-08-21 DIAGNOSIS — J40 Bronchitis, not specified as acute or chronic: Secondary | ICD-10-CM | POA: Diagnosis not present

## 2023-08-21 DIAGNOSIS — I495 Sick sinus syndrome: Secondary | ICD-10-CM | POA: Diagnosis not present

## 2023-08-26 DIAGNOSIS — J208 Acute bronchitis due to other specified organisms: Secondary | ICD-10-CM | POA: Diagnosis not present

## 2023-08-29 DIAGNOSIS — Z7901 Long term (current) use of anticoagulants: Secondary | ICD-10-CM | POA: Diagnosis not present

## 2023-08-29 DIAGNOSIS — R7303 Prediabetes: Secondary | ICD-10-CM | POA: Diagnosis not present

## 2023-08-29 DIAGNOSIS — E785 Hyperlipidemia, unspecified: Secondary | ICD-10-CM | POA: Diagnosis not present

## 2023-08-29 DIAGNOSIS — E559 Vitamin D deficiency, unspecified: Secondary | ICD-10-CM | POA: Diagnosis not present

## 2023-09-01 DIAGNOSIS — E785 Hyperlipidemia, unspecified: Secondary | ICD-10-CM | POA: Diagnosis not present

## 2023-09-01 DIAGNOSIS — M79661 Pain in right lower leg: Secondary | ICD-10-CM | POA: Diagnosis not present

## 2023-09-01 DIAGNOSIS — Z7901 Long term (current) use of anticoagulants: Secondary | ICD-10-CM | POA: Diagnosis not present

## 2023-09-01 DIAGNOSIS — R058 Other specified cough: Secondary | ICD-10-CM | POA: Diagnosis not present

## 2023-09-01 DIAGNOSIS — R7303 Prediabetes: Secondary | ICD-10-CM | POA: Diagnosis not present

## 2023-09-01 DIAGNOSIS — Z1331 Encounter for screening for depression: Secondary | ICD-10-CM | POA: Diagnosis not present

## 2023-09-01 DIAGNOSIS — I10 Essential (primary) hypertension: Secondary | ICD-10-CM | POA: Diagnosis not present

## 2023-09-01 DIAGNOSIS — Z0000 Encounter for general adult medical examination without abnormal findings: Secondary | ICD-10-CM | POA: Diagnosis not present

## 2023-09-01 DIAGNOSIS — I4891 Unspecified atrial fibrillation: Secondary | ICD-10-CM | POA: Diagnosis not present

## 2023-09-01 DIAGNOSIS — M79662 Pain in left lower leg: Secondary | ICD-10-CM | POA: Diagnosis not present

## 2023-09-01 DIAGNOSIS — G4733 Obstructive sleep apnea (adult) (pediatric): Secondary | ICD-10-CM | POA: Diagnosis not present

## 2023-09-01 DIAGNOSIS — D6869 Other thrombophilia: Secondary | ICD-10-CM | POA: Diagnosis not present

## 2023-09-05 DIAGNOSIS — Z45018 Encounter for adjustment and management of other part of cardiac pacemaker: Secondary | ICD-10-CM | POA: Diagnosis not present

## 2023-09-05 DIAGNOSIS — Z7901 Long term (current) use of anticoagulants: Secondary | ICD-10-CM | POA: Diagnosis not present

## 2023-09-05 DIAGNOSIS — I499 Cardiac arrhythmia, unspecified: Secondary | ICD-10-CM | POA: Diagnosis not present

## 2023-09-05 DIAGNOSIS — I454 Nonspecific intraventricular block: Secondary | ICD-10-CM | POA: Diagnosis not present

## 2023-10-30 ENCOUNTER — Ambulatory Visit (INDEPENDENT_AMBULATORY_CARE_PROVIDER_SITE_OTHER): Payer: Medicare Other | Admitting: Physician Assistant

## 2023-10-30 ENCOUNTER — Ambulatory Visit: Payer: Medicare (Managed Care) | Admitting: Psychiatry

## 2023-10-30 ENCOUNTER — Encounter: Payer: Self-pay | Admitting: Physician Assistant

## 2023-10-30 DIAGNOSIS — F411 Generalized anxiety disorder: Secondary | ICD-10-CM | POA: Diagnosis not present

## 2023-10-30 DIAGNOSIS — G4700 Insomnia, unspecified: Secondary | ICD-10-CM | POA: Diagnosis not present

## 2023-10-30 MED ORDER — ESCITALOPRAM OXALATE 10 MG PO TABS: 10.0000 mg | ORAL_TABLET | Freq: Every day | ORAL | 3 refills | Status: AC

## 2023-10-30 NOTE — Progress Notes (Signed)
 [facility]  Patient ID: Mercedes Thompson,  MRN: [id number]  PCP: Shirlean Mylar, MD  Date of Evaluation: 10/30/2023 Time spent:20 minutes  Chief Complaint:  Chief Complaint   Depression; Insomnia; Follow-up    HISTORY/CURRENT STATUS: HPI transferring to my care from Corie Chiquito, NP  States she's doing well on current meds. Sleeps well as long as she has the Ativan.  On it since she was dx w/ leukemia in 1987, for sleep. She's tried numerous other meds for it but they cause side effects or aren't effective.  Uses CPAP.  Doesn't take Ativan during the day. No PA.   Patient is able to enjoy things.  Energy and motivation are good.  No extreme sadness, tearfulness, or feelings of hopelessness.  ADLs and personal hygiene are normal.  No change in memory.  Appetite has not changed.  Weight is stable.  Denies suicidal or homicidal thoughts.  Denies dizziness, syncope, seizures, numbness, tingling, tremor, tics, unsteady gait, slurred speech, confusion. Denies muscle or joint pain, stiffness, or dystonia.   Individual Medical History/ Review of Systems: Changes? :Yes  Was in hosp last month for Afib, now on Amiodorone  Past medications for mental health diagnoses include: Trazodone- Vivid dreams Rozerem. Vivid dreams Lorazepam Lexapro  Allergies: Atorvastatin, Bupropion, Paroxetine hcl, Rosuvastatin, Sertraline, Sulfa antibiotics, Sulfamethoxazole-trimethoprim, Decongest-aid [pseudoephedrine], and Erythromycin base  Current Medications:  Current Outpatient Medications:    acetaminophen (TYLENOL) 325 MG tablet, Take 650 mg by mouth as needed., Disp: , Rfl:    amiodarone (PACERONE) 200 MG tablet, Take 200 mg by mouth daily., Disp: , Rfl:    calcium-vitamin D (OSCAL WITH D) 500-200 MG-UNIT tablet, Take 1 tablet by mouth daily with breakfast., Disp: , Rfl:    cholecalciferol (VITAMIN D) 1000 UNITS tablet, Take 1,000 Units by mouth daily., Disp: , Rfl:    ezetimibe (ZETIA) 10  MG tablet, Take 1 tablet by mouth daily., Disp: , Rfl:    fluticasone (FLONASE) 50 MCG/ACT nasal spray, Place 2 sprays into the nose daily as needed., Disp: , Rfl:    furosemide (LASIX) 20 MG tablet, Take 20 mg by mouth daily as needed for fluid (for fluid retention over 2 lbs). , Disp: , Rfl:    Lactobacillus (PROBIOTIC ACIDOPHILUS PO), Take by mouth., Disp: , Rfl:    LORazepam (ATIVAN) 1 MG tablet, Take 1 mg by mouth at bedtime., Disp: , Rfl:    MAGNESIUM PO, Take by mouth., Disp: , Rfl:    Multiple Vitamin (MULTIVITAMIN WITH MINERALS) TABS tablet, Take 1 tablet by mouth daily., Disp: , Rfl:    Probiotic Product (ALIGN) 4 MG CAPS, Take 1 capsule by mouth daily., Disp: , Rfl:    tolterodine (DETROL LA) 4 MG 24 hr capsule, Take 4 mg by mouth as needed., Disp: , Rfl:    UNABLE TO FIND, CPAP, Disp: , Rfl:    warfarin (COUMADIN) 5 MG tablet, Take by mouth. Taking 7.5 Tue, Thurs, Sat, Sun and 10 mg Mon, Wed, Friday, Disp: , Rfl:    budesonide-formoterol (SYMBICORT) 80-4.5 MCG/ACT inhaler, Inhale 2 puffs into the lungs 2 (two) times daily. (Patient not taking: Reported on 10/30/2023), Disp: , Rfl:    escitalopram (LEXAPRO) 10 MG tablet, Take 1 tablet (10 mg total) by mouth daily., Disp: 90 tablet, Rfl: 3   SOTALOL AF 80 MG TABS, Take 80 mg by mouth 2 (two) times daily. (Patient not taking: Reported on 10/30/2023), Disp: , Rfl:  Medication Side Effects: none  Family Medical/ Social History:  Changes? No  MENTAL HEALTH EXAM:  There were no vitals taken for this visit.There is no height or weight on file to calculate BMI.  General Appearance: Casual and Well Groomed  Eye Contact:  Good  Speech:  Clear and Coherent and Normal Rate  Volume:  Normal  Mood:  Euthymic  Affect:  Congruent  Thought Process:  Goal Directed and Descriptions of Associations: Circumstantial  Orientation:  Full (Time, Place, and Person)  Thought Content: Logical   Suicidal Thoughts:  No  Homicidal Thoughts:  No  Memory:   WNL  Judgement:  Good  Insight:  Good  Psychomotor Activity:  Normal  Concentration:  Concentration: Good  Recall:  Good  Fund of Knowledge: Good  Language: Good  Assets:  Communication Skills Desire for Improvement Financial Resources/Insurance Housing Resilience Transportation  ADL's:  Intact  Cognition: WNL  Prognosis:  Good    DIAGNOSES:    ICD-10-CM   1. Insomnia, unspecified type  G47.00     2. Generalized anxiety disorder  F41.1 escitalopram (LEXAPRO) 10 MG tablet     Receiving Psychotherapy: No   RECOMMENDATIONS:   PDMP reviewed. Ativan filled 10/28/2023. I provided 20 minutes of face to face time during this encounter, including time spent before and after the visit in records review, medical decision making, counseling pertinent to today's visit, and charting.   She's doing well with current meds so no changes will be made. She understands the increased risk of confusion and falls w/ any BZ.  She's been on Ativan for over 30 years, and is unable to sleep without it.  Benefit outweighs the risk for her.   Continue Lexapro 10 mg, 1 every day. Continue Ativan 1 mg, 1 at bedtime.  Per PCP. Return in 1 yr.  Melony Overly, PA-C

## 2023-12-15 ENCOUNTER — Telehealth: Payer: Self-pay | Admitting: Physician Assistant

## 2023-12-15 NOTE — Telephone Encounter (Signed)
 Shanda Bumps at [facility] called at 4:45p.  She said the pt had an echocardiogram and based on results and some other medication that she takes, I think she said SOTALOL AF), she is experiencing increased anxiety.  They want to know Teresa's thoughts on increasing the Lexapro or switching to a different medication.  Next appt 2/12

## 2023-12-16 NOTE — Telephone Encounter (Signed)
 Ok to increase Lexapro to total 15 mg daily.  Send a 1 month supply w/ RF1. Also increase the Ativan 1 mg from 1 at night routinely, to 1/2 during the day prn and continue the 1 at bedtime routinely.  Please pend whatever she needs sent. I think her PCP has been prescribing that, but she will need extra for a few months, I think while we wait for the increased Lexapro to help.   Have her make an appt w/ me in 6-8 weeks. Thanks.

## 2023-12-16 NOTE — Telephone Encounter (Signed)
 Please see message from Cardiology. I called patient today and she does not necessarily report anxiety, but is having difficulty sleeping. She reports this is a SE of the Sotalol. She said Cardiology thought she had anxiety over all the testing, procedures, med changes. She denies caffeine use. Reports difficulty getting to sleep, but can stay asleep once she gets to sleep. Estimates getting 6-7 hours of sleep. She is asking if you will increase Lexapro and/or lorazepam. She said from a financial aspect it would be better to prescribe 20 mg of Lexapro to actually increase dose to 15 mg and she could break tabs in half.   Pharmacy - [facility] on [address].

## 2023-12-18 NOTE — Telephone Encounter (Signed)
 Pt reporting her PCP prescribes meds and you just monitor her. She said PCP has taken care of her RF and she doesn't need anything from [place] at this time.

## 2024-05-26 ENCOUNTER — Encounter: Payer: Self-pay | Admitting: Gastroenterology

## 2024-07-23 ENCOUNTER — Ambulatory Visit (INDEPENDENT_AMBULATORY_CARE_PROVIDER_SITE_OTHER): Payer: Medicare (Managed Care) | Admitting: Gastroenterology

## 2024-07-23 ENCOUNTER — Telehealth: Payer: Self-pay

## 2024-07-23 ENCOUNTER — Encounter: Payer: Self-pay | Admitting: Gastroenterology

## 2024-07-23 VITALS — BP 136/68 | HR 69 | Ht 67.0 in | Wt 228.0 lb

## 2024-07-23 DIAGNOSIS — Z952 Presence of prosthetic heart valve: Secondary | ICD-10-CM | POA: Diagnosis not present

## 2024-07-23 DIAGNOSIS — Z860100 Personal history of colon polyps, unspecified: Secondary | ICD-10-CM | POA: Diagnosis not present

## 2024-07-23 DIAGNOSIS — Z7901 Long term (current) use of anticoagulants: Secondary | ICD-10-CM

## 2024-07-23 DIAGNOSIS — I4891 Unspecified atrial fibrillation: Secondary | ICD-10-CM

## 2024-07-23 DIAGNOSIS — I495 Sick sinus syndrome: Secondary | ICD-10-CM

## 2024-07-23 DIAGNOSIS — K5900 Constipation, unspecified: Secondary | ICD-10-CM

## 2024-07-23 NOTE — Patient Instructions (Signed)
 _______________________________________________________  If your blood pressure at your visit was 140/90 or greater, please contact your primary care physician to follow up on this.  _______________________________________________________  If you are age 78 or older, your body mass index should be between 23-30. Your Body mass index is 35.71 kg/m. If this is out of the aforementioned range listed, please consider follow up with your Primary Care Provider.  If you are age 38 or younger, your body mass index should be between 19-25. Your Body mass index is 35.71 kg/m. If this is out of the aformentioned range listed, please consider follow up with your Primary Care Provider.   ________________________________________________________  The [facility] providers would like to encourage you to use MYCHART to communicate with providers for non-urgent requests or questions.  Due to long hold times on the telephone, sending your provider a message by [facility] may be a faster and more efficient way to get a response.  Please allow 48 business hours for a response.  Please remember that this is for non-urgent requests.  _______________________________________________________  [facility] is using a team-based approach to care.  Your team is made up of your doctor and two to three APPS. Our APPS (Nurse Practitioners and Physician Assistants) work with your physician to ensure care continuity for you. They are fully qualified to address your health concerns and develop a treatment plan. They communicate directly with your gastroenterologist to care for you. Seeing the Advanced Practice Practitioners on your physician's team can help you by facilitating care more promptly, often allowing for earlier appointments, access to diagnostic testing, procedures, and other specialty referrals.   Please follow up with your cardiologist. We are sending a clearance as well  It was a pleasure to see you  today!  Thank you for trusting me with your gastrointestinal care!

## 2024-07-23 NOTE — Telephone Encounter (Signed)
 Sent clearance to Dr Debora at [phone] p [phone]. Will reach out to her other cardiology on 11-25 (heartcare) to see about clearance

## 2024-07-23 NOTE — Progress Notes (Signed)
 Addendum: Reviewed and agree with assessment and management plan. Understand her hesitancy with her other medical problems. With a normal colonoscopy in 2019 and only nonadvanced adenomas in the more distant past, surveillance interval of 10 years would be appropriate.  This would mean that she would not be due for repeat exam until 2029. She is low risk for advanced polyp and cancer given her prior surveillance findings. If she is hesitant I feel it is reasonable to discontinue colonoscopy for the purposes of surveillance.  This does not mean that we could not perform diagnostic colonoscopy in the future should symptoms warrant Mercedes Thompson, Mercedes HERO, MD

## 2024-07-23 NOTE — Progress Notes (Signed)
 Chief Complaint: recall colonoscopy Primary GI MD: Dr. Albertus  HPI: Discussed the use of AI scribe software for clinical note transcription with the patient, who gave verbal consent to proceed.  History of Present Illness  Mercedes Thompson is a 78 year old female who presents for evaluation of her need for a colonoscopy.  Her last colonoscopy was on November 06, 2017, at [facility], which showed no polyps. She recalls having a colonoscopy in 2021, but records confirm the last one was in 2019. Prior to that, she had a colonoscopy in 2014, which also showed no polyps, although she had two polyps removed in an earlier procedure.  She reports a change in bowel habits this year, noting difficulty with defecation at times, but also experiencing urgency when relaxed and distracted, such as when shopping. She had an episode of impaction two to three months ago, which required manual disimpaction. Since then, she has been taking a stool softener twice daily, which has improved her symptoms. No weight loss or rectal bleeding reported.  She has a history of heart issues, including two ablations, the last of which was in July of the previous year. She is on Coumadin  and has concerns about the need to stop it for a colonoscopy. She has an upcoming appointment with her cardiologist.  She reports recent leg aches and knee pain, for which she received a cortisone shot in early October, resulting in improved sleep and reduced pain.    PREVIOUS GI WORKUP   10/05/2008 EGD 2014 colonoscopy hyperplastic colon polyps 11/06/2017 colonoscopy at [facility] due to high risk/personal history of colon polyps showed diverticulosis in sigmoid colon otherwise unremarkable recall 5 years.   Mom, dad and brother with colon polyps, no advanced polyps, no family history of colon cancer.   Past Medical History:  Diagnosis Date   AML (acute myeloblastic leukemia) (HCC) 09/16/1985   Anxiety    Anxiety     Appendicitis with peritoneal abscess 03/17/2015   s/p ex lap at [facility]   Atrial fibrillation [facility])    Atrial flutter (HCC) 09/16/2014   s/p cardioversion 02/2015, previously in Tikos[place], recently taken off amiodarone   Bradycardia 09/16/2013   s/p PPM insertion   Bronchitis    CHF (congestive heart failure) (HCC)    Colon polyp    Diverticulosis    H/O mitral valve replacement with mechanical valve    coumadin    HLD (hyperlipidemia)    Pneumonia    Sleep apnea treated with continuous positive airway pressure (CPAP)     Past Surgical History:  Procedure Laterality Date   APPENDECTOMY  03/24/2015   [facility] Dr Tina.  perforated appendicitis   heart ablation     MITRAL VALVE REPLACEMENT  1986   PACEMAKER INSERTION     TUBAL LIGATION      Current Outpatient Medications  Medication Sig Dispense Refill   acetaminophen  (TYLENOL ) 325 MG tablet Take 650 mg by mouth as needed.     budesonide-formoterol (SYMBICORT) 80-4.5 MCG/ACT inhaler Inhale 2 puffs into the lungs 2 (two) times daily.     calcium-vitamin D (OSCAL WITH D) 500-200 MG-UNIT tablet Take 1 tablet by mouth daily with breakfast.     cholecalciferol (VITAMIN D) 1000 UNITS tablet Take 1,000 Units by mouth daily.     escitalopram  (LEXAPRO ) 10 MG tablet Take 1 tablet (10 mg total) by mouth daily. 90 tablet 3   fluticasone  (FLONASE ) 50 MCG/ACT nasal spray Place 2 sprays into the nose daily as needed.  furosemide  (LASIX ) 20 MG tablet Take 20 mg by mouth daily as needed for fluid (for fluid retention over 2 lbs).      Lactobacillus (PROBIOTIC ACIDOPHILUS PO) Take by mouth.     LORazepam  (ATIVAN ) 1 MG tablet Take 1 mg by mouth at bedtime.     MAGNESIUM PO Take by mouth.     Multiple Vitamin (MULTIVITAMIN WITH MINERALS) TABS tablet Take 1 tablet by mouth daily.     Probiotic Product (ALIGN) 4 MG CAPS Take 1 capsule by mouth daily.     tolterodine (DETROL LA) 4 MG 24 hr capsule Take 4 mg by mouth as needed.     UNABLE TO FIND CPAP      warfarin (COUMADIN ) 5 MG tablet Take by mouth. Taking 7.5 Tue, Thurs, Sat, Sun and 10 mg Mon, Wed, Friday (Patient taking differently: Take by mouth. Taking 7.5 Tue, Thurs,  Fri, Sat, and  5 mg on Sunday and Wednesday 5 mg)     ezetimibe (ZETIA) 10 MG tablet Take 1 tablet by mouth daily. (Patient not taking: Reported on 07/23/2024)     No current facility-administered medications for this visit.    Allergies as of 07/23/2024 - Review Complete 07/23/2024  Allergen Reaction Noted   Atorvastatin  06/04/2022   Bupropion Nausea Only 08/13/2011   Paroxetine hcl Nausea Only 05/10/2015   Rosuvastatin  06/04/2022   Sertraline Nausea Only 05/10/2015   Sulfa antibiotics  01/14/2013   Sulfamethoxazole-trimethoprim  11/10/2000   Decongest-aid [pseudoephedrine] Palpitations 01/14/2013   Erythromycin base Nausea And Vomiting 11/13/2001    Family History  Problem Relation Age of Onset   Depression Mother    Colon polyps Mother    Heart disease Mother    Lung cancer Father    Colon polyps Father    Arthritis Father    Heart disease Maternal Aunt    Colon polyps Brother    Arthritis Brother    Ulcerative colitis Neg Hx    Crohn's disease Neg Hx     Social History   Socioeconomic History   Marital status: Married    Spouse name: Not on file   Number of children: 2   Years of education: Not on file   Highest education level: Not on file  Occupational History   Occupation: retired  Tobacco Use   Smoking status: Former    Types: Cigarettes   Smokeless tobacco: Never   Tobacco comments:    In [employer] school, 6 packs in my whole life  Vaping Use   Vaping status: Never Used  Substance and Sexual Activity   Alcohol use: No    Comment: rare   Drug use: No   Sexual activity: Not on file  Other Topics Concern   Not on file  Social History Narrative   Not on file   Social Drivers of Health   Financial Resource Strain: Not on file  Food Insecurity: Low Risk  (11/25/2023)   Received  from [facility]   Hunger Vital Sign    Within the past 12 months, you worried that your food would run out before you got money to buy more: Never true    Within the past 12 months, the food you bought just didn't last and you didn't have money to get more. : Never true  Transportation Needs: No Transportation Needs (11/25/2023)   Received from [facility]    In the past 12 months, has lack of reliable transportation kept you from medical  appointments, meetings, work or from getting things needed for daily living? : No  Physical Activity: Not on file  Stress: Not on file  Social Connections: Unknown (01/26/2022)   Received from [facility]   Social Network    Social Network: Not on file  Intimate Partner Violence: Unknown (12/18/2021)   Received from [facility]   HITS    Physically Hurt: Not on file    Insult or Talk Down To: Not on file    Threaten Physical Harm: Not on file    Scream or Curse: Not on file    Review of Systems:    Constitutional: No weight loss, fever, chills, weakness or fatigue HEENT: Eyes: No change in vision               Ears, Nose, Throat:  No change in hearing or congestion Skin: No rash or itching Cardiovascular: No chest pain, chest pressure or palpitations   Respiratory: No SOB or cough Gastrointestinal: See HPI and otherwise negative Genitourinary: No dysuria or change in urinary frequency Neurological: No headache, dizziness or syncope Musculoskeletal: No new muscle or joint pain Hematologic: No bleeding or bruising Psychiatric: No history of depression or anxiety    Physical Exam:  Vital signs: BP 136/68   Pulse 69   Ht 5' 7 (1.702 m)   Wt 228 lb (103.4 kg)   SpO2 97%   BMI 35.71 kg/m   Constitutional: NAD, alert and cooperative.  Appears significantly younger than stated age.  Obese. Head:  Normocephalic and atraumatic. Eyes:   PEERL, EOMI. No icterus. Conjunctiva pink. Respiratory: Respirations even and  unlabored. Lungs clear to auscultation bilaterally.   No wheezes, crackles, or rhonchi.  Cardiovascular:  Regular rate and rhythm. No peripheral edema, cyanosis or pallor.  Gastrointestinal:  Soft, nondistended, nontender. No rebound or guarding. Normal bowel sounds. No appreciable masses or hepatomegaly. Rectal:  Declines Msk:  Symmetrical without gross deformities. Without edema, no deformity or joint abnormality.  Neurologic:  Alert and  oriented x4;  grossly normal neurologically.  Skin:   Dry and intact without significant lesions or rashes. Psychiatric: Oriented to person, place and time. Demonstrates good judgement and reason without abnormal affect or behaviors.   RELEVANT LABS AND IMAGING: CBC    Component Value Date/Time   WBC 6.5 10/30/2015 0543   RBC 3.90 10/30/2015 0543   HGB 11.9 (L) 10/30/2015 0543   HCT 37.6 10/30/2015 0543   PLT 261 10/30/2015 0543   MCV 96.4 10/30/2015 0543   MCH 30.5 10/30/2015 0543   MCHC 31.6 10/30/2015 0543   RDW 13.6 10/30/2015 0543   LYMPHSABS 0.9 10/29/2015 1015   MONOABS 1.2 (H) 10/29/2015 1015   EOSABS 0.0 10/29/2015 1015   BASOSABS 0.0 10/29/2015 1015    CMP     Component Value Date/Time   NA 139 10/30/2015 0543   K 4.2 10/30/2015 0543   CL 106 10/30/2015 0543   CO2 23 10/30/2015 0543   GLUCOSE 99 10/30/2015 0543   BUN 11 10/30/2015 0543   CREATININE 0.80 10/30/2015 0543   CALCIUM 8.6 (L) 10/30/2015 0543   PROT 8.0 10/29/2015 1015   ALBUMIN 4.0 10/29/2015 1015   AST 35 10/29/2015 1015   ALT 29 10/29/2015 1015   ALKPHOS 75 10/29/2015 1015   BILITOT 1.1 10/29/2015 1015   GFRNONAA >60 10/30/2015 0543   GFRAA >60 10/30/2015 0543     Assessment/Plan:   78 year old female with history of atrial fibrillation, sick sinus syndrome, history of mitral  valve replacement with mechanical valve on Coumadin  with recent ablation and subsequent cardioversion in July of 2024, history of lap appendectomy secondary to abscess July 2016, SSS  status post pacemaker history of AML,  presents to discuss colonoscopy  History of colon polyps 11/06/2017 colonoscopy at [facility] due to high risk/personal history of colon polyps showed diverticulosis in sigmoid colon otherwise unremarkable recall 5 years. No polyps on colonoscopy in 2014. Colonoscopy done prior to 2014 with small polyps. Mom and dad with colon polyps but no family history of colon cancer. Patient has had previous colonoscopies while on coumadin .  Extensive discussion with patient about utility of proceeding with repeat colonoscopy.  Her last 2 colonoscopies have had no polyps which is reassuring.  Patient is somewhat hesitant to do procedure in the first place.  Discussed that she does not have to do this in the absence of symptoms.  She still if she may want to do it for peace of mind.  Ultimately joint decision was made and we decided to obtain cardiac clearance to assess her cardiac risk and discussed with Dr. Albertus if he is willing to do the procedure on Coumadin .  She is aware if the procedure is done on Coumadin  and polyps are found she would have to have a repeat colonoscopy while off Coumadin  to have polyps removed  --Obtain cardiac clearance and risk assessment - Will discuss with Dr. Albertus willingness to do colonoscopy while on Coumadin  - Once we have the answers to above patient will then make her decision on if she would like to pursue colonoscopy - Ultimately this is up to the patient.  Based on previous colonoscopies she appears to be average risk of developing colon cancer   Constipation Recent episode of constipation requiring manual disimpaction. - MiraLAX 1 cap per day and adjust dose based on response - If persistent symptoms pursue colonoscopy.  Nestor Mollie RIGGERS [facility] 07/23/2024, 3:57 PM  Cc: Douglass Ivanoff, MD

## 2024-07-26 NOTE — Telephone Encounter (Signed)
 Dr Debora said that   Ms Mercedes Thompson has a mechanical mitral valve that requires her to be bridged with therapeutic heparin of lovenox during the duration she is off the coumadin  for the endoscopic procedure. She is of elevated cardiac risk for low risk endoscopic procedure.  Please advise if patient needs to schedule her colonoscopy or not?

## 2024-07-26 NOTE — Telephone Encounter (Signed)
 Please disregard I forgot I was waiting on the other cardiologist as well

## 2024-07-28 NOTE — Telephone Encounter (Signed)
 Patient returned call asking to speak with Bayley.  Patient stated that [facility] called her this morning.  I explained that Bayley was off this afternoon and asked what I could assist her with. Patient was not sure as to why she was getting a call. I explained there was no call documented.  Patient is scheduled to see Dr Mona with [facility].  He would be in charge of lipid lowering injections. He would not be her primary cardiologist.  He would not be responsible for giving her clearance to proceed with colonoscopy.  Dr Debora is her primary cardiologist.  She has cleared her to proceed with colonoscopy with lovenox bridging.  Patient states that her Coumadin  is managed by PCP.  Clearance and lovenox bridging instructions should be given by PCP who manages Coumadin .  Clearance letter faxed to Clemencia Hint, FNP at [facility].

## 2024-07-28 NOTE — Telephone Encounter (Signed)
 Inbound call from patient stating that she was returning a call back to [facility]. I did advise the patient that Lyle was the one who called her. Patient stated that she is requesting a call back from [facility]. Please advise.

## 2024-07-28 NOTE — Telephone Encounter (Signed)
Left message for patient to return call to our office. 

## 2024-07-29 NOTE — Telephone Encounter (Addendum)
 Noted. I never called the patient. Clearance canceled for PCP

## 2024-08-04 ENCOUNTER — Institutional Professional Consult (permissible substitution) (HOSPITAL_BASED_OUTPATIENT_CLINIC_OR_DEPARTMENT_OTHER): Admitting: Internal Medicine

## 2024-08-10 ENCOUNTER — Institutional Professional Consult (permissible substitution) (HOSPITAL_BASED_OUTPATIENT_CLINIC_OR_DEPARTMENT_OTHER): Admitting: Internal Medicine

## 2024-10-28 ENCOUNTER — Ambulatory Visit: Payer: Medicare Other | Admitting: Physician Assistant
